# Patient Record
Sex: Female | Born: 1945 | Race: Black or African American | Hispanic: No | Marital: Married | Smoking: Never smoker
Health system: Southern US, Community
[De-identification: ages and names within clinical notes are randomized; demographics above are authoritative.]

## PROBLEM LIST (undated history)

## (undated) DIAGNOSIS — H8110 Benign paroxysmal vertigo, unspecified ear: Secondary | ICD-10-CM

## (undated) DIAGNOSIS — C189 Malignant neoplasm of colon, unspecified: Secondary | ICD-10-CM

## (undated) DIAGNOSIS — G4730 Sleep apnea, unspecified: Secondary | ICD-10-CM

## (undated) DIAGNOSIS — Z8541 Personal history of malignant neoplasm of cervix uteri: Secondary | ICD-10-CM

## (undated) DIAGNOSIS — C539 Malignant neoplasm of cervix uteri, unspecified: Secondary | ICD-10-CM

## (undated) DIAGNOSIS — Z952 Presence of prosthetic heart valve: Secondary | ICD-10-CM

## (undated) DIAGNOSIS — I509 Heart failure, unspecified: Secondary | ICD-10-CM

## (undated) DIAGNOSIS — D649 Anemia, unspecified: Secondary | ICD-10-CM

## (undated) DIAGNOSIS — R011 Cardiac murmur, unspecified: Secondary | ICD-10-CM

## (undated) DIAGNOSIS — N189 Chronic kidney disease, unspecified: Secondary | ICD-10-CM

## (undated) DIAGNOSIS — I639 Cerebral infarction, unspecified: Secondary | ICD-10-CM

## (undated) DIAGNOSIS — I10 Essential (primary) hypertension: Secondary | ICD-10-CM

## (undated) DIAGNOSIS — K219 Gastro-esophageal reflux disease without esophagitis: Secondary | ICD-10-CM

## (undated) HISTORY — DX: Essential (primary) hypertension: I10

## (undated) HISTORY — DX: Anemia, unspecified: D64.9

## (undated) HISTORY — PX: DIAGNOSTIC LAPAROSCOPY: SUR761

## (undated) HISTORY — DX: Malignant neoplasm of colon, unspecified: C18.9

## (undated) HISTORY — DX: Presence of prosthetic heart valve: Z95.2

## (undated) HISTORY — PX: CHOLECYSTECTOMY: SHX55

## (undated) HISTORY — DX: Cardiac murmur, unspecified: R01.1

## (undated) HISTORY — DX: Benign paroxysmal vertigo, unspecified ear: H81.10

## (undated) HISTORY — DX: Personal history of malignant neoplasm of cervix uteri: Z85.41

## (undated) HISTORY — PX: COLONOSCOPY WITH ESOPHAGOGASTRODUODENOSCOPY (EGD): SHX5779

## (undated) HISTORY — DX: Cerebral infarction, unspecified: I63.9

## (undated) HISTORY — PX: COLON SURGERY: SHX602

## (undated) HISTORY — DX: Gastro-esophageal reflux disease without esophagitis: K21.9

## (undated) HISTORY — PX: OTHER SURGICAL HISTORY: SHX169

## (undated) HISTORY — PX: APPENDECTOMY: SHX54

---

## 1996-10-11 DIAGNOSIS — C189 Malignant neoplasm of colon, unspecified: Secondary | ICD-10-CM

## 1996-10-11 HISTORY — PX: COLOSTOMY: SHX63

## 1996-10-11 HISTORY — DX: Malignant neoplasm of colon, unspecified: C18.9

## 2006-01-23 ENCOUNTER — Emergency Department: Payer: Self-pay | Admitting: Emergency Medicine

## 2006-10-11 HISTORY — PX: AORTIC VALVE REPLACEMENT: SHX41

## 2006-10-11 HISTORY — PX: CARDIAC CATHETERIZATION: SHX172

## 2007-04-01 ENCOUNTER — Inpatient Hospital Stay: Payer: Self-pay | Admitting: Internal Medicine

## 2007-04-01 ENCOUNTER — Other Ambulatory Visit: Payer: Self-pay

## 2007-04-20 ENCOUNTER — Ambulatory Visit: Payer: Self-pay | Admitting: Family Medicine

## 2007-04-24 ENCOUNTER — Ambulatory Visit: Payer: Self-pay | Admitting: Family Medicine

## 2007-05-03 ENCOUNTER — Ambulatory Visit: Payer: Self-pay | Admitting: Cardiology

## 2007-05-07 ENCOUNTER — Other Ambulatory Visit: Payer: Self-pay

## 2007-05-07 ENCOUNTER — Emergency Department: Payer: Self-pay | Admitting: Emergency Medicine

## 2007-05-23 ENCOUNTER — Ambulatory Visit: Payer: Self-pay | Admitting: Cardiology

## 2007-06-01 ENCOUNTER — Ambulatory Visit: Payer: Self-pay | Admitting: Surgery

## 2007-08-31 ENCOUNTER — Ambulatory Visit: Payer: Self-pay | Admitting: Cardiology

## 2007-08-31 ENCOUNTER — Inpatient Hospital Stay: Payer: Self-pay | Admitting: Cardiology

## 2007-09-14 ENCOUNTER — Other Ambulatory Visit: Payer: Self-pay

## 2007-09-14 ENCOUNTER — Inpatient Hospital Stay: Payer: Self-pay | Admitting: Internal Medicine

## 2008-01-05 ENCOUNTER — Ambulatory Visit: Payer: Self-pay | Admitting: Surgery

## 2008-08-06 ENCOUNTER — Ambulatory Visit: Payer: Self-pay | Admitting: Cardiology

## 2008-08-28 ENCOUNTER — Ambulatory Visit: Payer: Self-pay

## 2008-10-01 ENCOUNTER — Inpatient Hospital Stay: Payer: Self-pay | Admitting: Internal Medicine

## 2008-11-19 ENCOUNTER — Inpatient Hospital Stay: Payer: Self-pay | Admitting: Internal Medicine

## 2008-11-22 IMAGING — CR DG CHEST 2V
1 series · 2 of 2 positions shown · non-contrast
Comparison: none

REASON FOR EXAM: cough
COMMENTS:

[Series 1: view not recorded · 0.17mm/px · 2 of 2 slices shown]
[im 1/2]
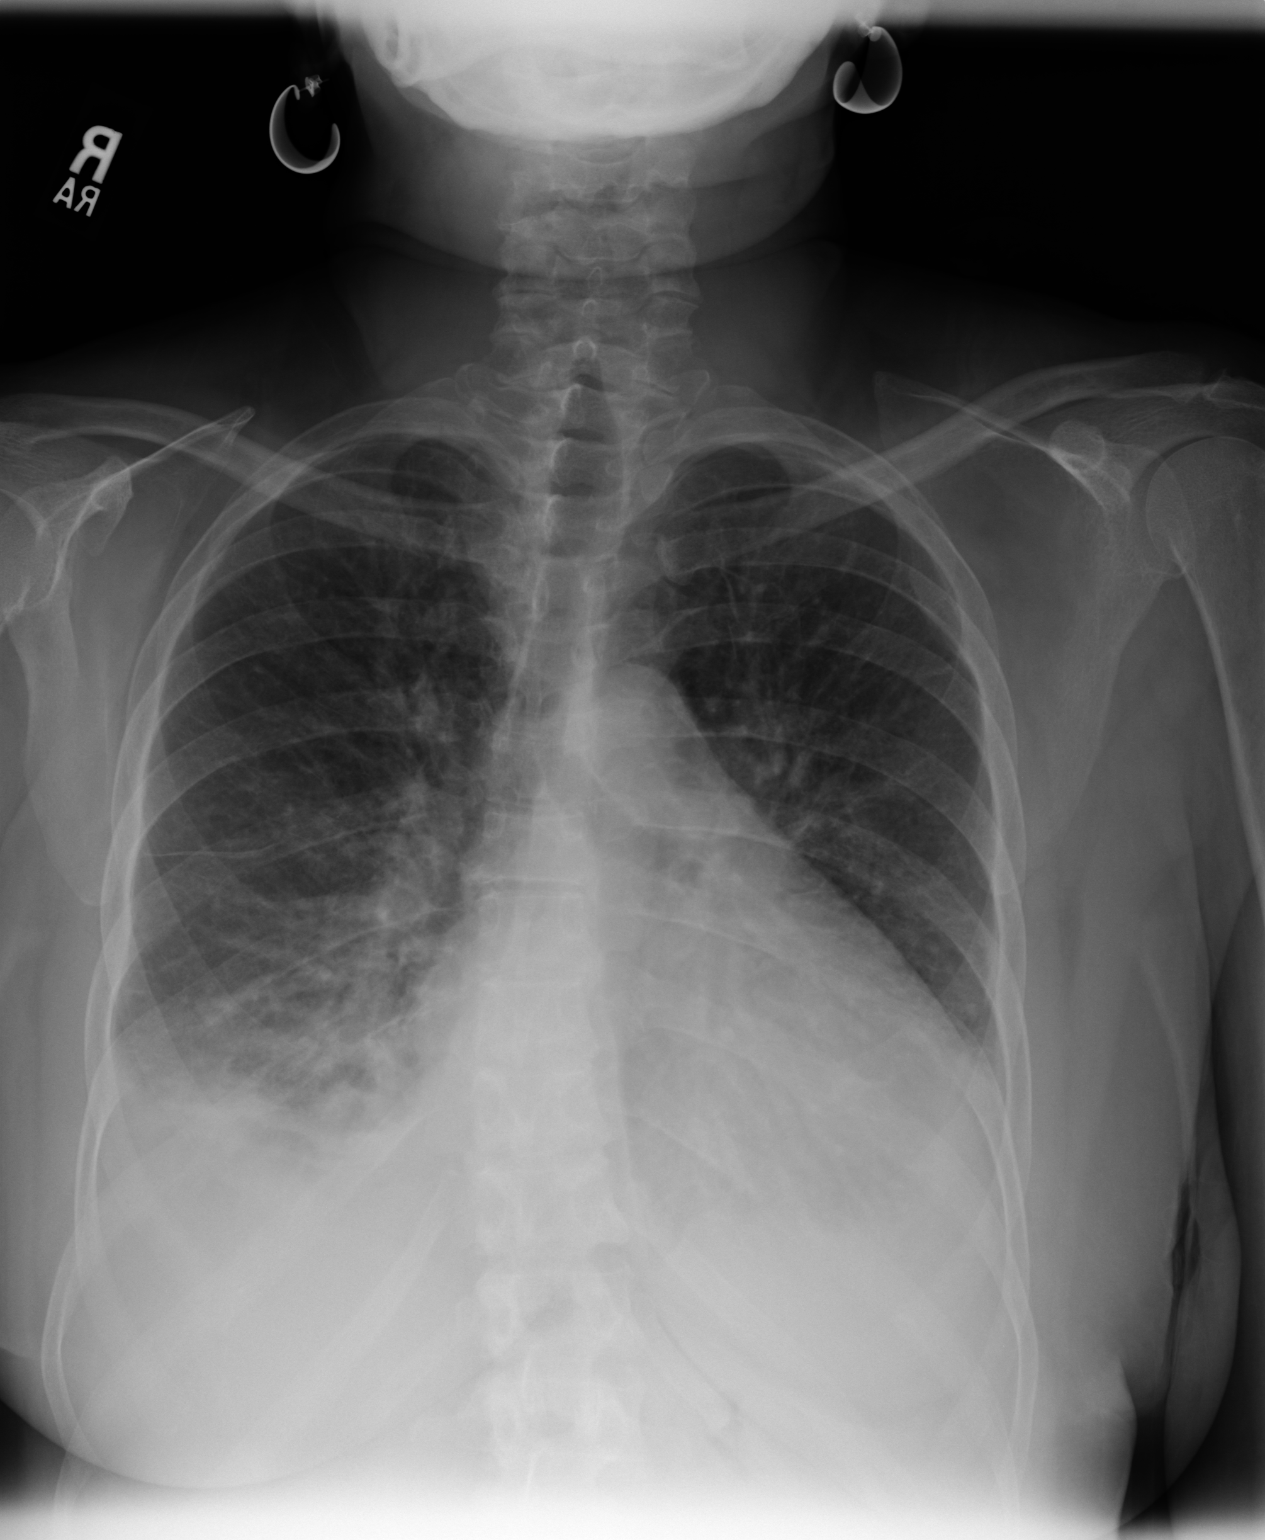
[im 2/2]
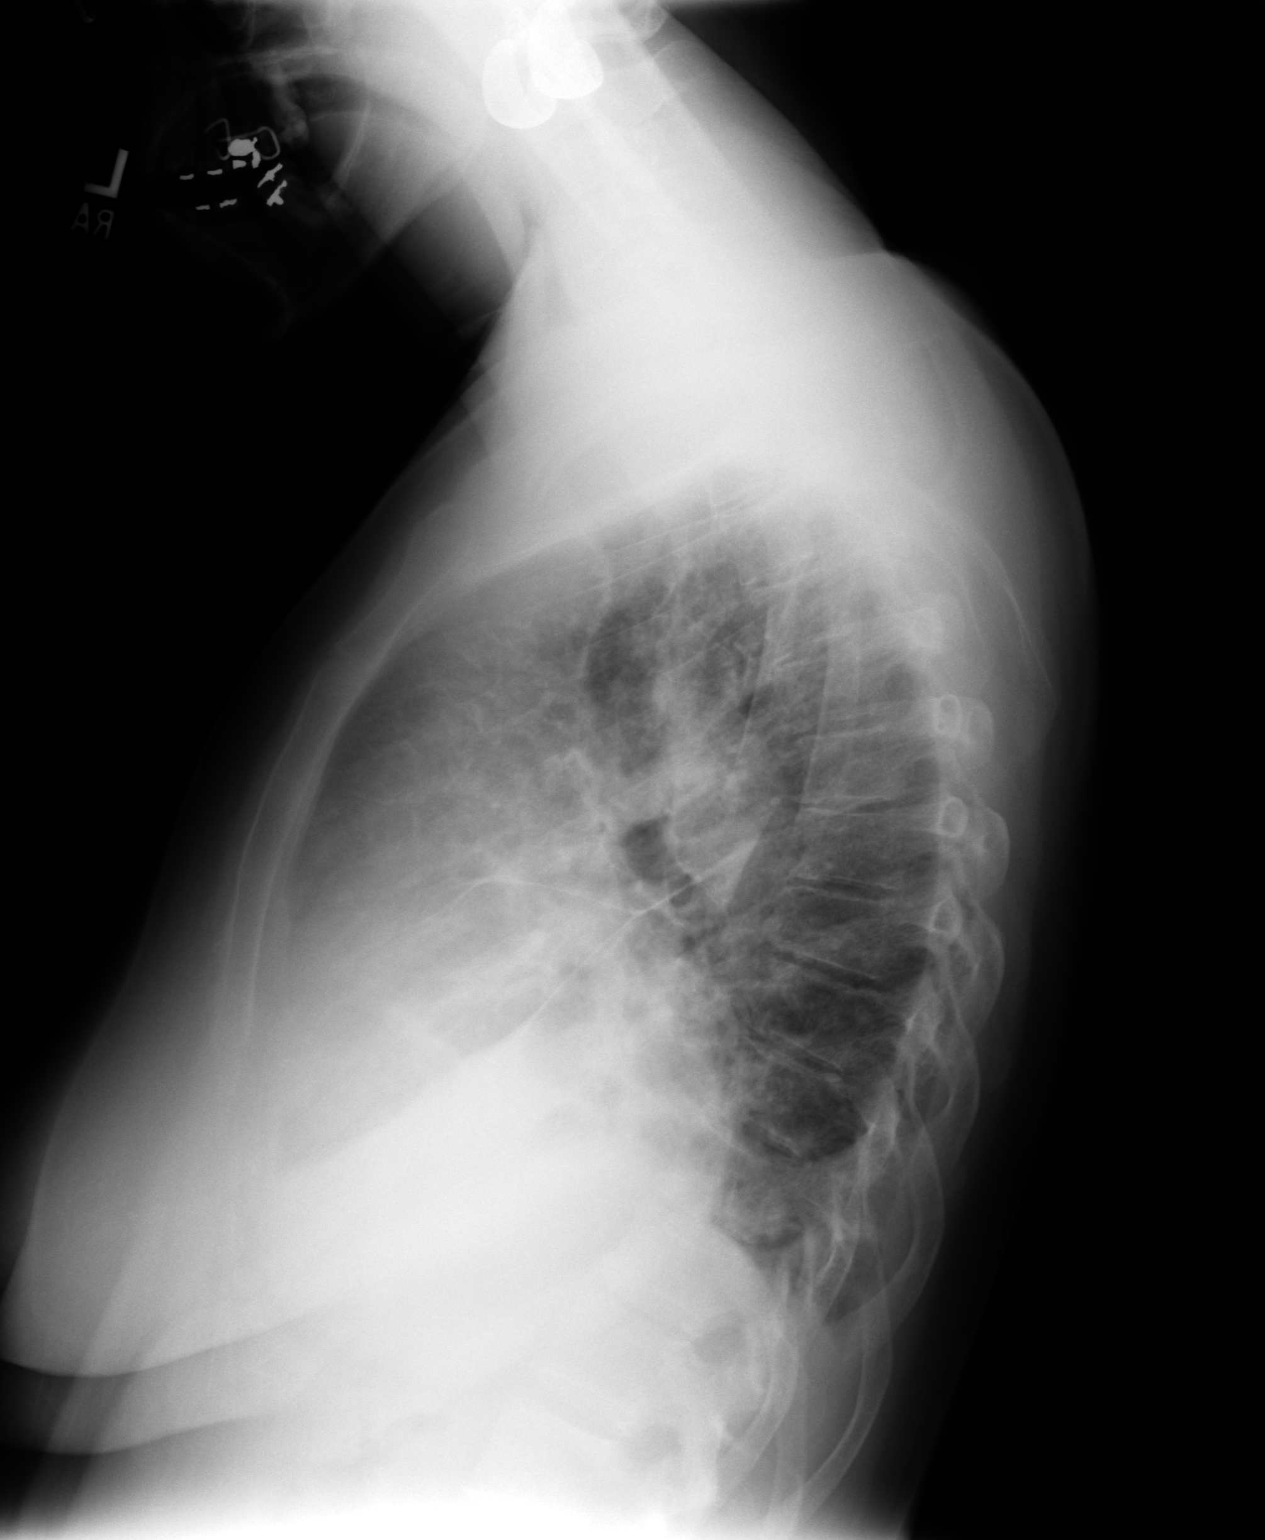

[2 of 2 positions shown; findings below may reference images not displayed]

PROCEDURE:     DXR - DXR CHEST PA (OR AP) AND LATERAL  - April 01, 2007  [DATE]

RESULT:     There is thickening demonstrating some pulmonary vascular and
interstitial markings as well as peribronchial cuffing.  Areas of increased
density project within the RIGHT and LEFT lung bases.  There appears to be
small bilateral pulmonary effusions.  The cardiac silhouette is enlarged,
indicative of cardiomegaly.  The visualized bony skeleton does not
demonstrate evidence of fracture nor dislocation.
IMPRESSION: 1.     Findings consistent with pulmonary edema.
2.     Atelectasis versus infiltrate versus asymmetric edema within the lung
bases.  There also appear to be superimposed effusions.  Repeat surveillance
evaluation recommended.

## 2008-11-22 IMAGING — CT CT CHEST W/ CM
2 series · 15 of 31 positions shown, 19 images · IV contrast (APPLIED)
Comparison: none

REASON FOR EXAM: sob, elevated d dimer
COMMENTS:

[Series 5: soft tissue · axial · 0.68mm/px · z∈[-356,-308]mm · 2 of 101 slices shown]
[im 8/101  mediastinal]
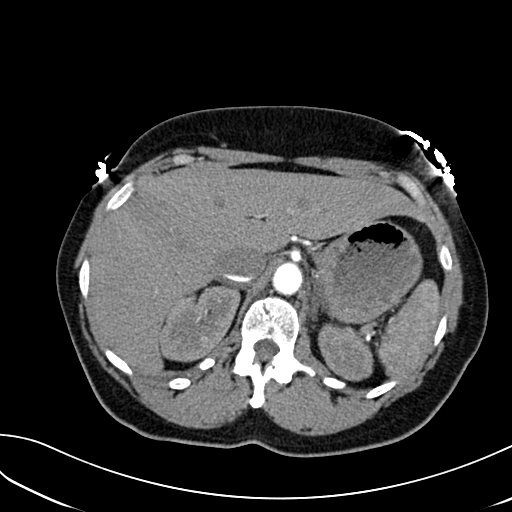
[im 24/101  mediastinal]
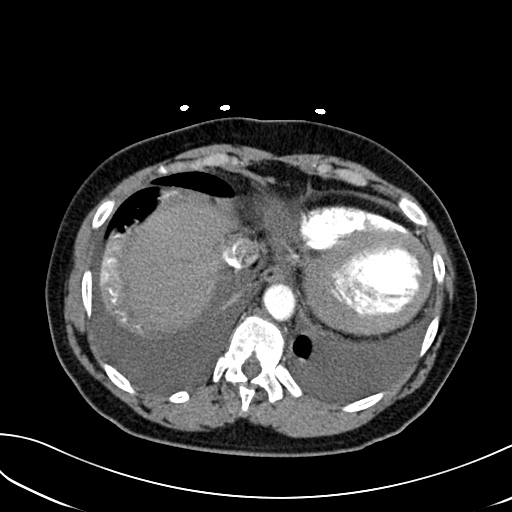

[Series 6: lung windows · axial · 0.68mm/px · z∈[-350,-101]mm · 13 of 99 slices shown, 17 images]
[im 8/99  mediastinal]
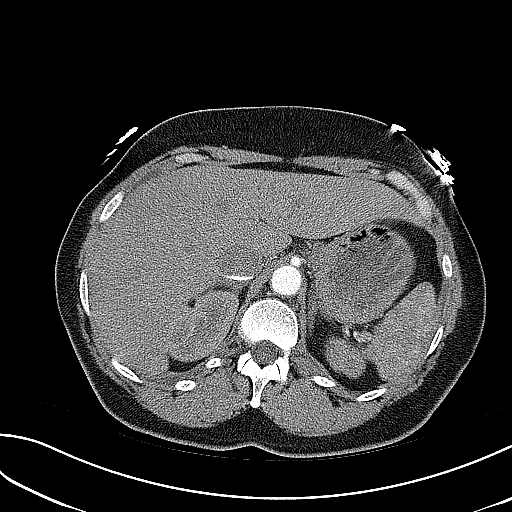
[im 8/99  lung]
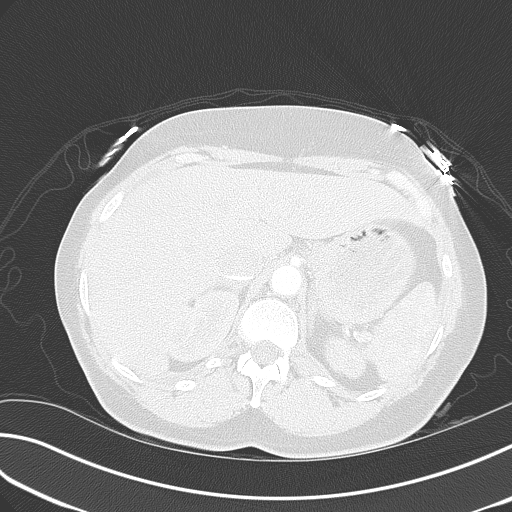
[im 16/99  lung]
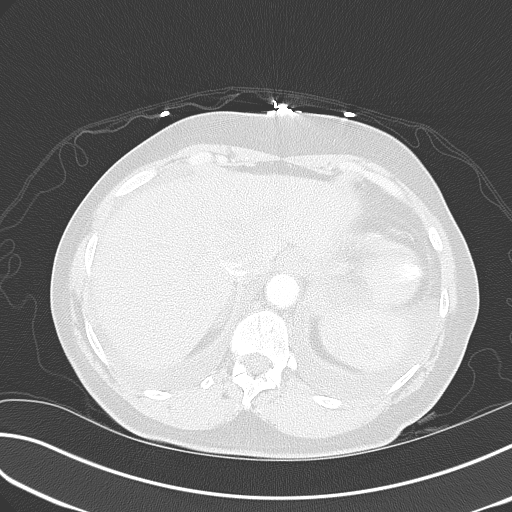
[im 23/99  lung]
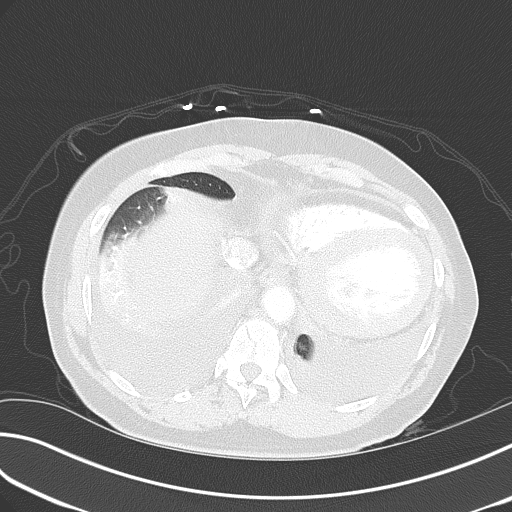
[im 31/99  lung]
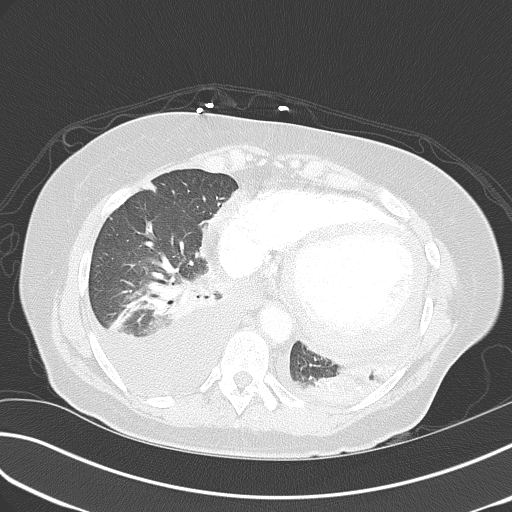
[im 38/99  mediastinal]
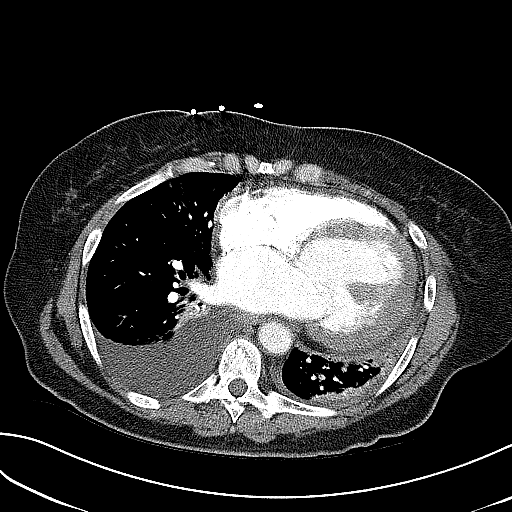
[im 38/99  lung]
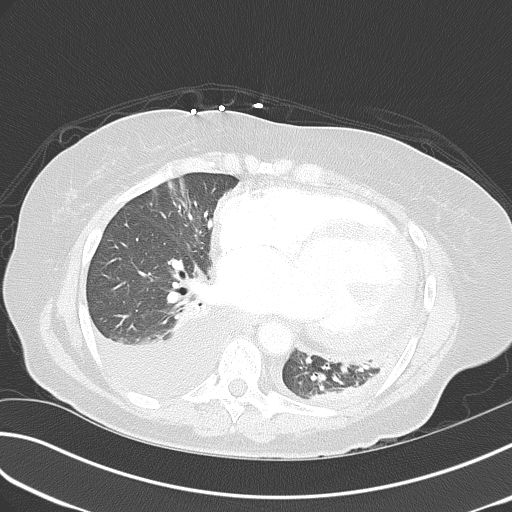
[im 46/99  lung]
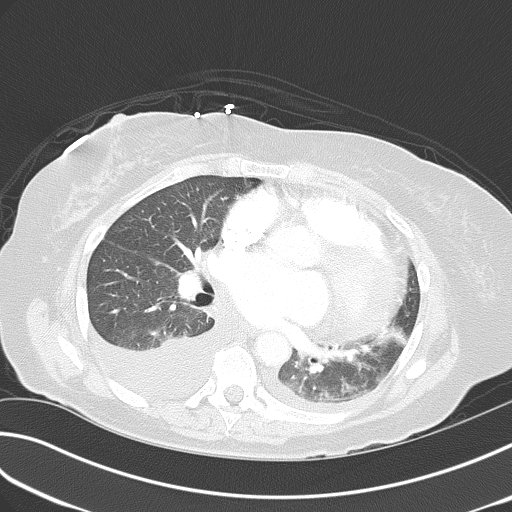
[im 50/99  lung]
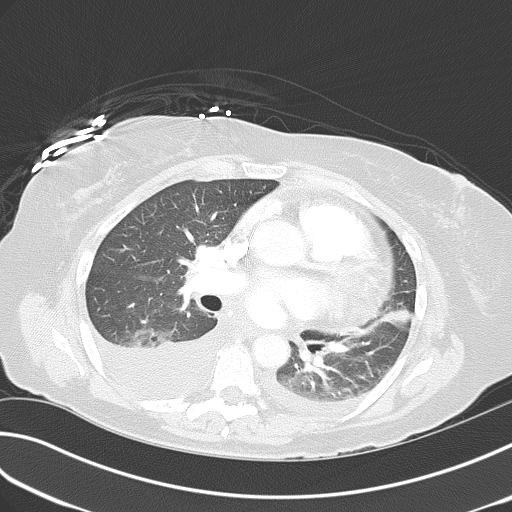
[im 53/99  lung]
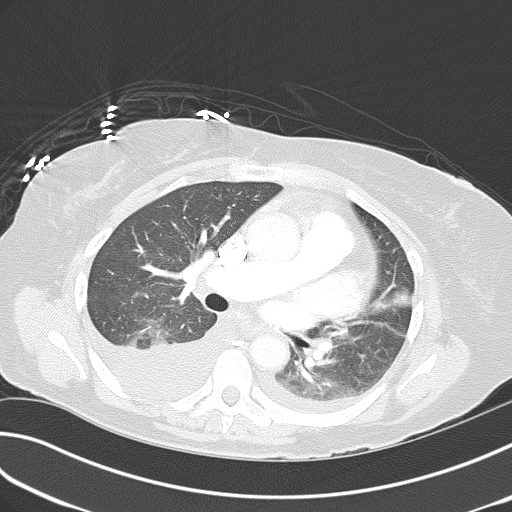
[im 61/99  mediastinal]
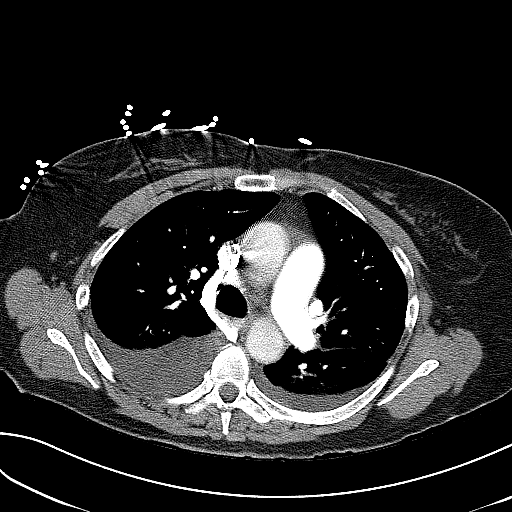
[im 61/99  lung]
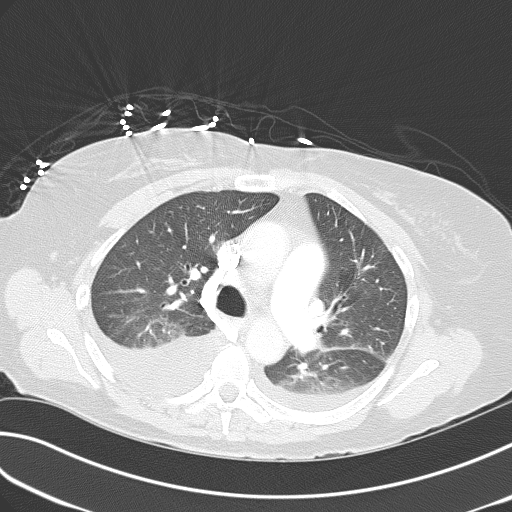
[im 68/99  lung]
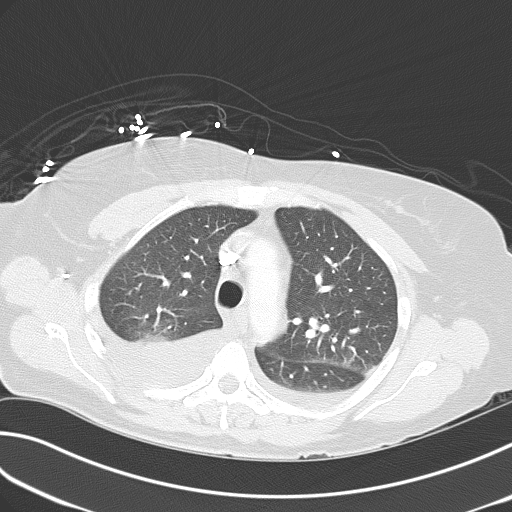
[im 76/99  lung]
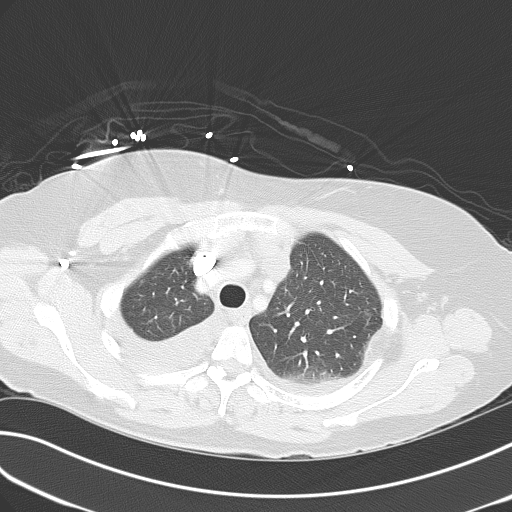
[im 83/99  lung]
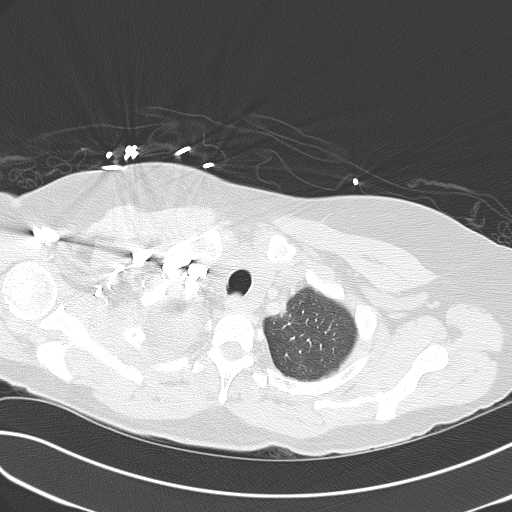
[im 91/99  mediastinal]
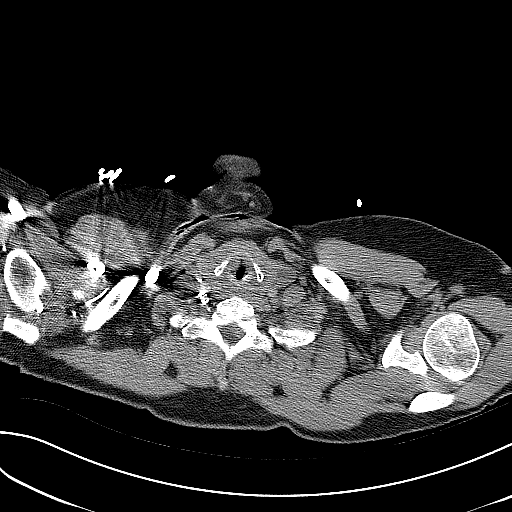
[im 91/99  lung]
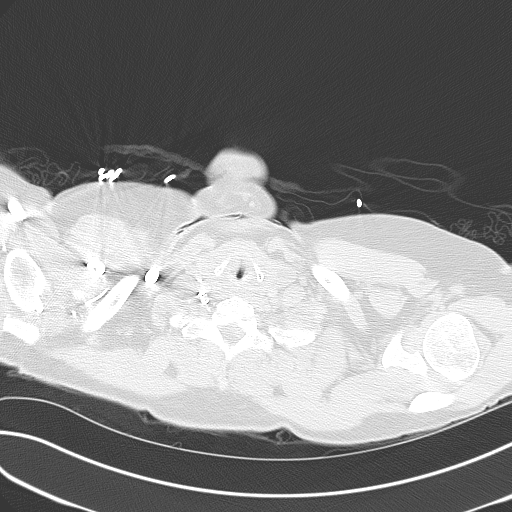

[15 of 31 positions shown; findings below may reference images not displayed]

PROCEDURE:     CT  - CT CHEST (FOR PE) W  - April 01, 2007 [DATE]

RESULT:
Axial 3 mm sections are obtained from the thoracic inlet to the lung bases
status post intravenous administration of 100 ml of Isovue 370.

Evaluation of mediastinal regions and structures demonstrates no evidence of
mediastinal nor hilar adenopathy or masses.  Multichamber cardiac
enlargement is appreciated.  There does not appear to be evidence of filling
defects within the main lobar or segmental pulmonary arteries.  Bilateral
pulmonary effusions are identified, small on the LEFT, moderate to small on
the RIGHT.  Evaluation of the lung parenchyma demonstrate diffuse air space
disease within the lower lobes with areas of consolidation and atelectasis
within the lung bases.  The visualized upper abdominal viscera demonstrate
no gross abnormalities.
IMPRESSION: 1.     No evidence of pulmonary embolic disease.
2.     Bilateral effusions.
3.     Atelectasis versus infiltrate lung bases as well as possibly an
element of pulmonary edema and/or infectious infiltrate if clinically
warranted.

## 2008-11-28 ENCOUNTER — Emergency Department: Payer: Self-pay | Admitting: Emergency Medicine

## 2008-12-15 IMAGING — US ULTRASOUND RIGHT BREAST
1 series · 17 of 23 positions shown · non-contrast
Comparison: none

REASON FOR EXAM: Right breast density
COMMENTS:

[Series 1: ultrasound right breast · 17 of 23 slices shown]
[im 1/23]
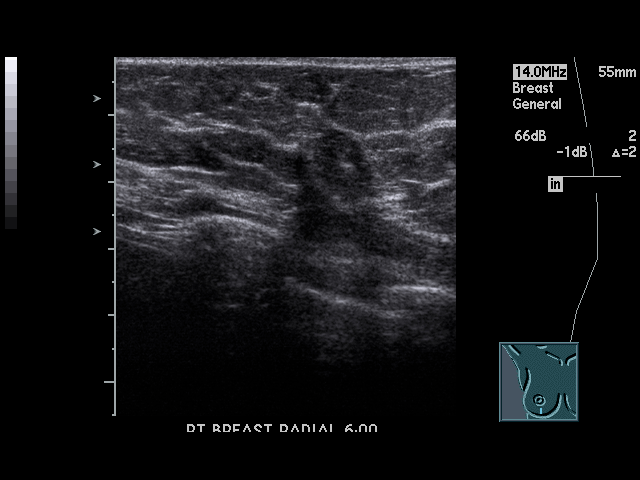
[im 3/23]
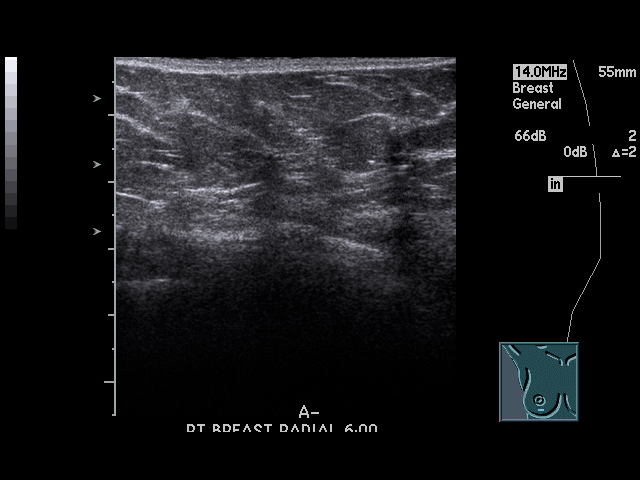
[im 4/23]
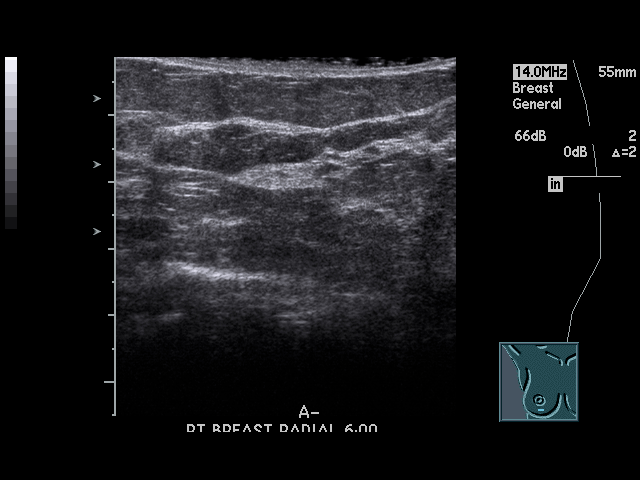
[im 5/23]
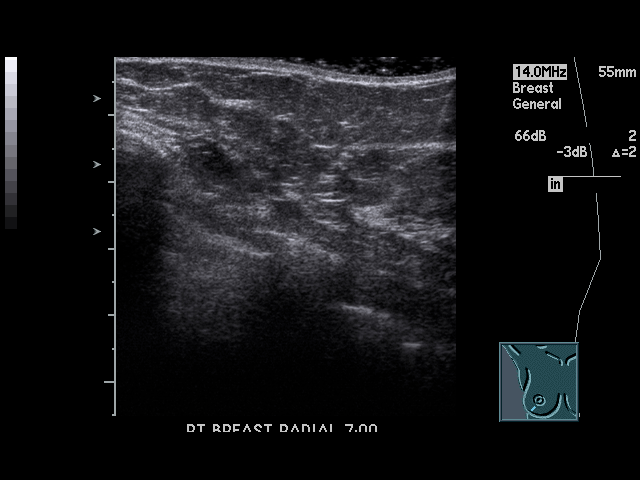
[im 7/23]
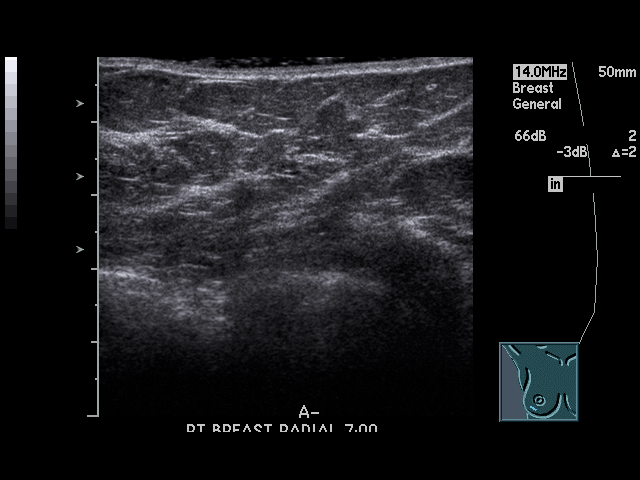
[im 8/23]
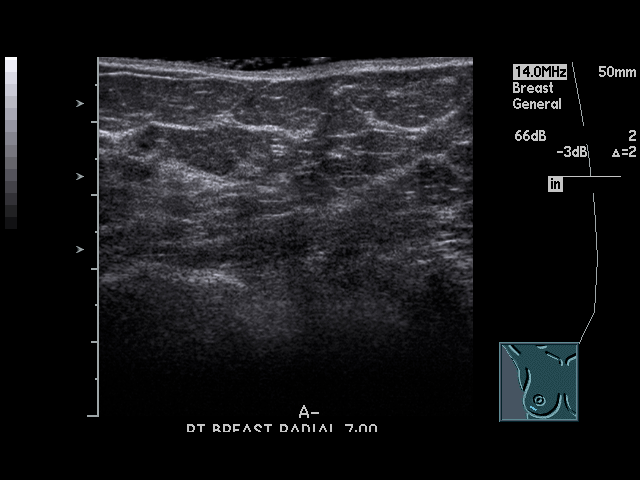
[im 9/23]
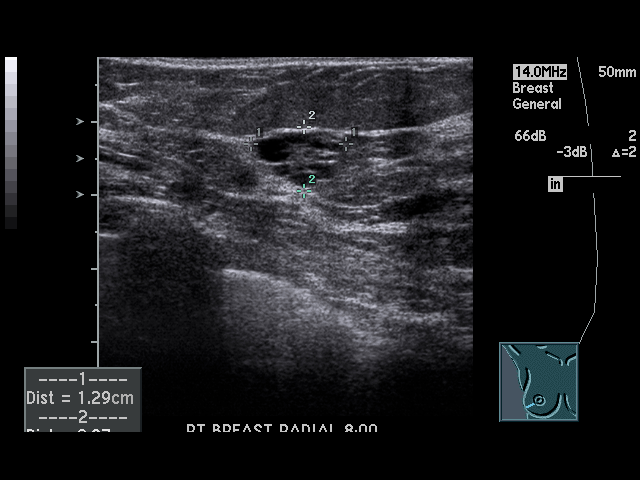
[im 11/23]
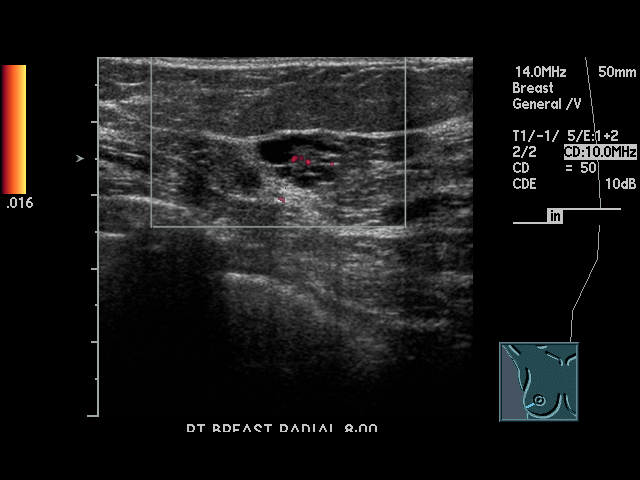
[im 12/23]
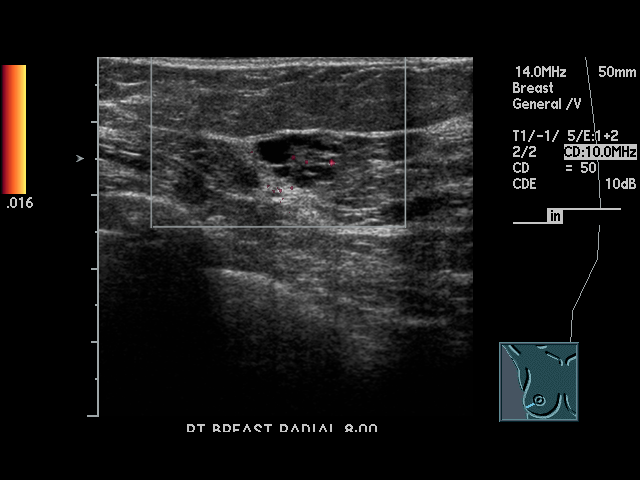
[im 13/23]
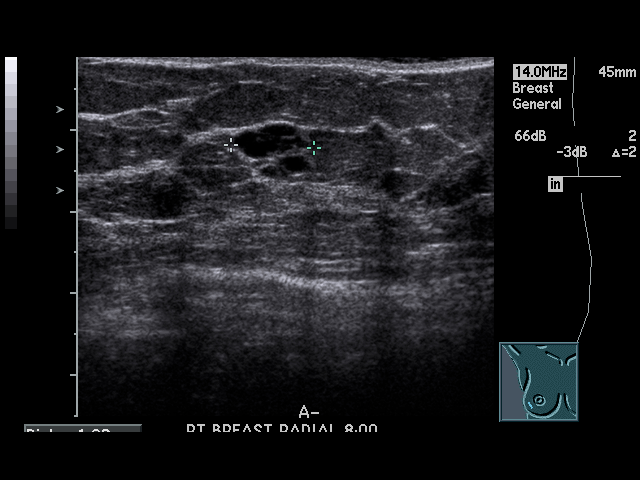
[im 15/23]
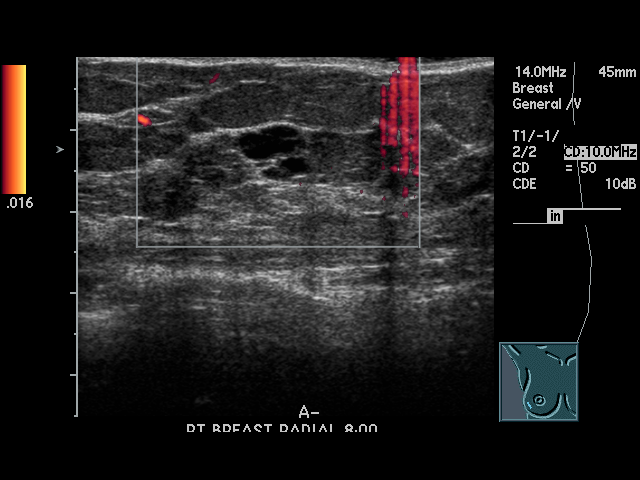
[im 16/23]
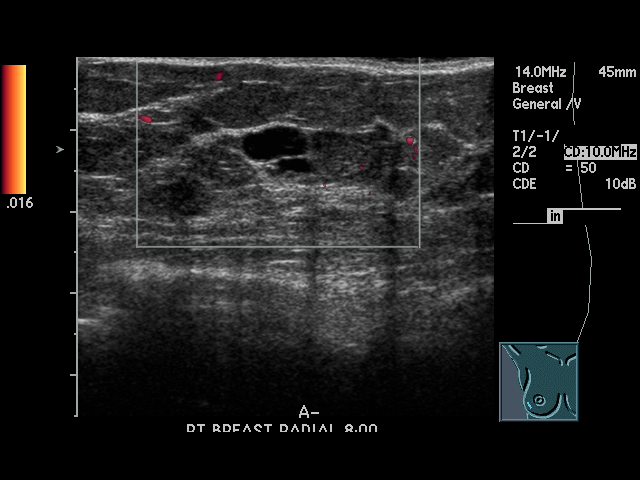
[im 17/23]
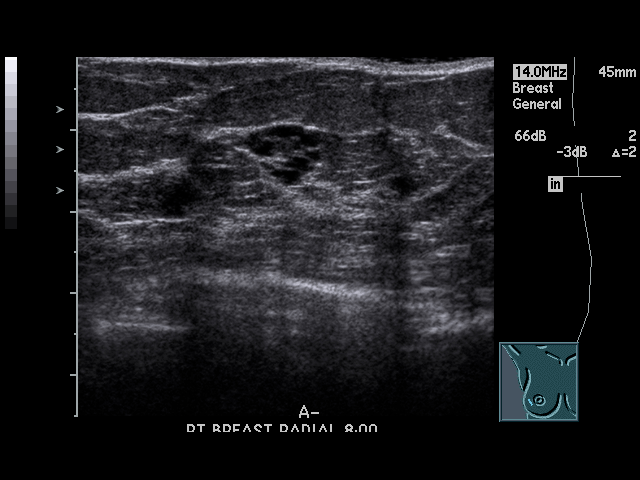
[im 19/23]
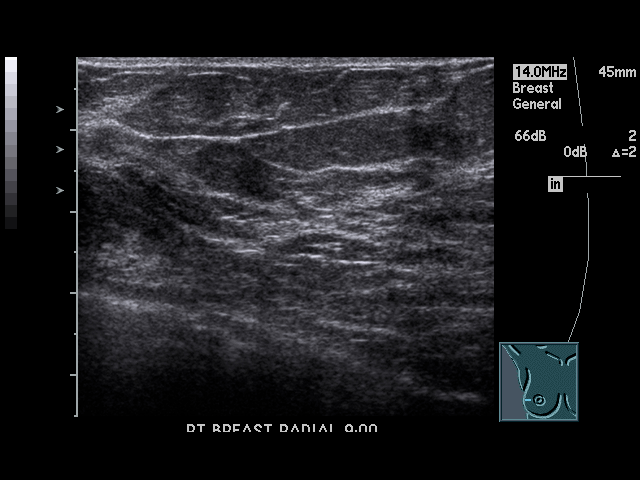
[im 20/23]
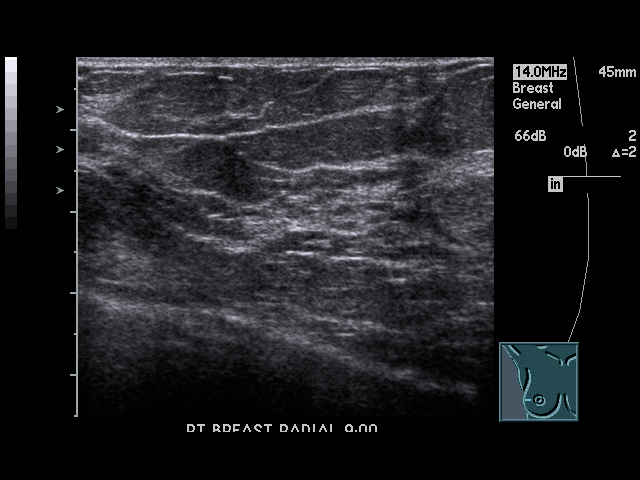
[im 21/23]
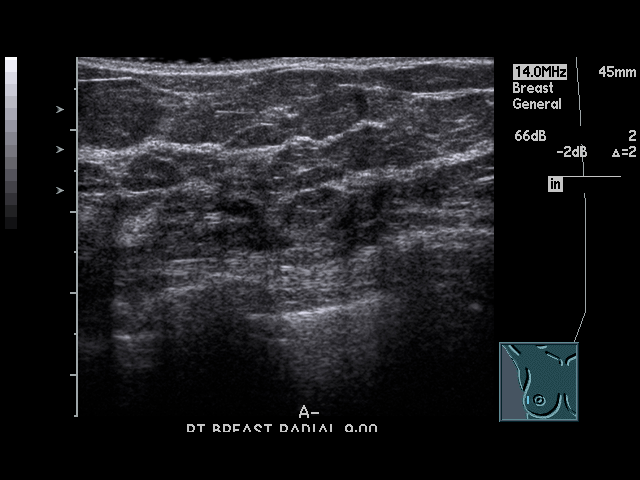
[im 23/23]
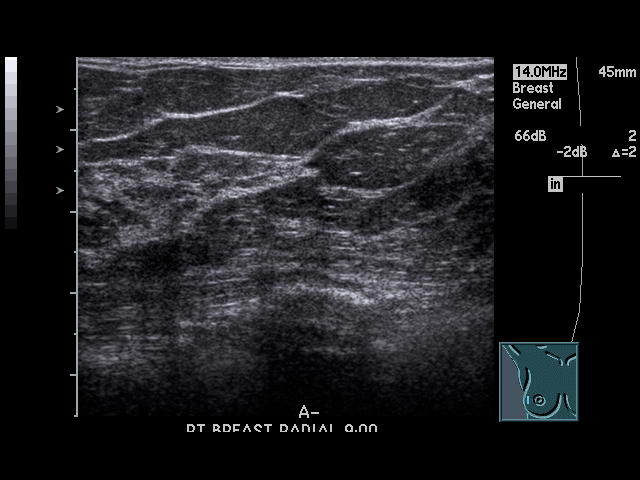

[17 of 23 positions shown; findings below may reference images not displayed]

PROCEDURE:     US  - US BREAST RIGHT  - April 24, 2007  [DATE]

RESULT:     There are no prior studies available for comparison.

The area at 8 o'clock in the RIGHT breast demonstrates a complex cystic
structure with flow in the intervening parenchyma or septations. This area
measures approximately 12.9 mm x 8.7 mm x 10.2 mm. The area is nonspecific.
This is an indeterminate lesion. At minimum, a six month follow-up is
recommended but surgical consultation is recommended for further
investigation.
IMPRESSION: BI-RADS: Category 4 - Suspicious Abnormality

Thank you for this opportunity to contribute to the care of your patient.

A NEGATIVE MAMMOGRAM REPORT DOES NOT PRECLUDE BIOPSY OR OTHER EVALUATION OF
A CLINICALLY PALPABLE OR OTHERWISE SUSPICIOUS MASS OR LESION. BREAST CANCER
MAY NOT BE DETECTED BY MAMMOGRAPHY IN UP TO 10% OF CASES.

## 2008-12-25 ENCOUNTER — Inpatient Hospital Stay: Payer: Self-pay | Admitting: Specialist

## 2009-10-14 ENCOUNTER — Emergency Department: Payer: Self-pay | Admitting: Emergency Medicine

## 2009-11-10 ENCOUNTER — Ambulatory Visit: Payer: Self-pay | Admitting: Internal Medicine

## 2009-12-31 ENCOUNTER — Inpatient Hospital Stay: Payer: Self-pay | Admitting: Internal Medicine

## 2010-01-21 ENCOUNTER — Ambulatory Visit: Payer: Self-pay | Admitting: Pain Medicine

## 2010-03-24 ENCOUNTER — Ambulatory Visit: Payer: Self-pay | Admitting: Gastroenterology

## 2011-04-16 ENCOUNTER — Emergency Department: Payer: Self-pay | Admitting: *Deleted

## 2011-04-19 ENCOUNTER — Inpatient Hospital Stay: Payer: Self-pay | Admitting: Internal Medicine

## 2011-08-13 ENCOUNTER — Ambulatory Visit: Payer: Self-pay | Admitting: Adult Health

## 2011-08-13 ENCOUNTER — Inpatient Hospital Stay: Payer: Self-pay | Admitting: Internal Medicine

## 2011-08-16 LAB — CEA: CEA: 2.1 ng/mL (ref 0.0–4.7)

## 2011-08-23 LAB — PATHOLOGY REPORT

## 2011-09-22 ENCOUNTER — Ambulatory Visit: Payer: Self-pay | Admitting: Gynecologic Oncology

## 2011-09-22 ENCOUNTER — Ambulatory Visit: Payer: Self-pay

## 2011-11-02 ENCOUNTER — Ambulatory Visit: Payer: Self-pay | Admitting: Gynecologic Oncology

## 2011-11-12 ENCOUNTER — Ambulatory Visit: Payer: Self-pay | Admitting: Gynecologic Oncology

## 2013-05-23 ENCOUNTER — Ambulatory Visit: Payer: Self-pay | Admitting: Family Medicine

## 2013-10-23 ENCOUNTER — Ambulatory Visit: Payer: Self-pay | Admitting: Family Medicine

## 2014-02-13 ENCOUNTER — Ambulatory Visit: Payer: Self-pay | Admitting: Cardiovascular Disease

## 2014-02-26 ENCOUNTER — Ambulatory Visit: Payer: Self-pay | Admitting: Gastroenterology

## 2014-02-27 LAB — PATHOLOGY REPORT

## 2014-03-06 ENCOUNTER — Encounter: Payer: Self-pay | Admitting: Cardiovascular Disease

## 2014-03-06 ENCOUNTER — Ambulatory Visit (INDEPENDENT_AMBULATORY_CARE_PROVIDER_SITE_OTHER): Payer: Medicare PPO | Admitting: Cardiovascular Disease

## 2014-03-06 ENCOUNTER — Encounter (INDEPENDENT_AMBULATORY_CARE_PROVIDER_SITE_OTHER): Payer: Self-pay

## 2014-03-06 VITALS — BP 160/98 | HR 66 | Ht 62.0 in | Wt 163.5 lb

## 2014-03-06 DIAGNOSIS — R011 Cardiac murmur, unspecified: Secondary | ICD-10-CM | POA: Insufficient documentation

## 2014-03-06 DIAGNOSIS — Z9889 Other specified postprocedural states: Secondary | ICD-10-CM

## 2014-03-06 DIAGNOSIS — I10 Essential (primary) hypertension: Secondary | ICD-10-CM

## 2014-03-06 DIAGNOSIS — Z954 Presence of other heart-valve replacement: Secondary | ICD-10-CM

## 2014-03-06 DIAGNOSIS — R0602 Shortness of breath: Secondary | ICD-10-CM

## 2014-03-06 DIAGNOSIS — R079 Chest pain, unspecified: Secondary | ICD-10-CM

## 2014-03-06 MED ORDER — CLONIDINE HCL 0.1 MG PO TABS: 0.1000 mg | ORAL_TABLET | Freq: Two times a day (BID) | ORAL | Status: DC

## 2014-03-06 NOTE — Assessment & Plan Note (Signed)
Significant murmur appreciated on exam. Now with shortness of breath, episodes of chest pain. History of mitral valve annuloplasty ring. Repeat echocardiogram has been ordered to evaluate her valve

## 2014-03-06 NOTE — Assessment & Plan Note (Signed)
Prominent murmur likely secondary to mitral valve regurgitation, status post repair 2008

## 2014-03-06 NOTE — Patient Instructions (Signed)
Will will order an echocardiogram to evaluate your mitral valve repair and causes of your shortness of breath  Please start clonidine one in the am and pm Please call with blood pressure numbers  Please call us if you have new issues that need to be addressed before your next appt.  Your physician wants you to follow-up in: 1 month.

## 2014-03-06 NOTE — Assessment & Plan Note (Signed)
We have recommended that she start clonidine 0.1 mg twice a day. She was previously on clonidine 0.2 mg at the dose was decreased secondary to hypotension

## 2014-03-06 NOTE — Assessment & Plan Note (Signed)
Etiology of her shortness of breath is unclear. Possible contribution from her mitral valve regurgitation. Weight is elevated, deconditioning also likely contributing

## 2014-03-06 NOTE — Progress Notes (Signed)
Patient ID: Kendra Ritter, female    DOB: [birth year], 68 y.o.   MRN: [id number]  HPI Comments: Kendra Ritter is a 68 year old woman with history of mitral valve repair in 2008 at [facility] who presents by referral for evaluation of her murmur, shortness of breath, mitral valve, chest pain. Prior notes detail ejection fraction 35-40%, previous severe MR  She reports that she has been having episodes of chest pain from time to time. Sometimes associated with exertion, sometimes at rest. More commonly when she is not exerting herself. She reports that her blood pressure has been running high, typically 627-035 systolic. When asked about details of her surgery, she is unfamiliar as to what they did. Records obtained from [facility] through our system details mitral valve repair She has significant mitral valve regurgitation. Echocardiogram in Feb 2010 showed ejection fraction 35-40%, moderate MR that time interestingly echo around the same time done in the hospital showed only mild MR  She had a 26 mm ring placed by Dr. Evelina Ritter at [facility]. No significant CAD by catheterization July 2008 she does have a history of DVT November 2008  Notes indicate possible sleep apnea She was previously on clonidine twice a day for blood pressure control Cardiac catheterization in 2008 showed ejection fraction 45%, moderate MR   recent lab work June 2014 showing total cholesterol 149, LDL 37  EKG today shows normal sinus rhythm with rate 71 beats per minute, nonspecific ST and T wave abnormality in the anterolateral leads     Outpatient Encounter Prescriptions as of 03/06/2014  Medication Sig  . carvedilol (COREG) 25 MG tablet Take 25 mg by mouth 2 (two) times daily with a meal.  . esomeprazole (NEXIUM) 40 MG capsule Take 40 mg by mouth daily at 12 noon.  . gabapentin (NEURONTIN) 300 MG capsule Take 300 mg by mouth 3 (three) times daily.   . hydrochlorothiazide (HYDRODIURIL) 25 MG tablet Take 25 mg by mouth  daily.  Marland Kitchen losartan (COZAAR) 100 MG tablet Take 100 mg by mouth daily.  . meclizine (ANTIVERT) 25 MG tablet Take 25 mg by mouth daily.    Review of Systems  Constitutional: Negative.   HENT: Negative.   Eyes: Negative.   Respiratory: Positive for shortness of breath.   Cardiovascular: Positive for chest pain.  Gastrointestinal: Negative.   Endocrine: Negative.   Musculoskeletal: Negative.   Skin: Negative.   Allergic/Immunologic: Negative.   Neurological: Negative.   Hematological: Negative.   Psychiatric/Behavioral: Negative.   All other systems reviewed and are negative.   BP 160/98  Pulse 66  Ht 5\' 2"  (1.575 m)  Wt 163 lb 8 oz (74.163 kg)  BMI 29.90 kg/m2  Physical Exam  Nursing note and vitals reviewed. Constitutional: She is oriented to person, place, and time. She appears well-developed and well-nourished.  HENT:  Head: Normocephalic.  Nose: Nose normal.  Mouth/Throat: Oropharynx is clear and moist.  Eyes: Conjunctivae are normal. Pupils are equal, round, and reactive to light.  Neck: Normal range of motion. Neck supple. No JVD present.  Cardiovascular: Normal rate, regular rhythm, S1 normal, S2 normal and intact distal pulses.  Exam reveals no gallop and no friction rub.   Murmur heard.  Systolic murmur is present with a grade of 2/6  Pulmonary/Chest: Effort normal and breath sounds normal. No respiratory distress. She has no wheezes. She has no rales. She exhibits no tenderness.  Abdominal: Soft. Bowel sounds are normal. She exhibits no distension. There is no tenderness.  Musculoskeletal:  Normal range of motion. She exhibits no edema and no tenderness.  Lymphadenopathy:    She has no cervical adenopathy.  Neurological: She is alert and oriented to person, place, and time. Coordination normal.  Skin: Skin is warm and dry. No rash noted. No erythema.  Psychiatric: She has a normal mood and affect. Her behavior is normal. Judgment and thought content normal.     Assessment and Plan

## 2014-03-06 NOTE — Assessment & Plan Note (Signed)
Atypical chest pain. Not typically with exertion. Prior cardiac catheterization showing no significant CAD in 2008 no further testing at this time. Echocardiogram pending

## 2014-03-18 ENCOUNTER — Other Ambulatory Visit: Payer: Self-pay

## 2014-03-18 ENCOUNTER — Other Ambulatory Visit (INDEPENDENT_AMBULATORY_CARE_PROVIDER_SITE_OTHER): Payer: Medicare PPO

## 2014-03-18 DIAGNOSIS — R0602 Shortness of breath: Secondary | ICD-10-CM

## 2014-03-18 DIAGNOSIS — I059 Rheumatic mitral valve disease, unspecified: Secondary | ICD-10-CM

## 2014-03-18 DIAGNOSIS — R011 Cardiac murmur, unspecified: Secondary | ICD-10-CM

## 2014-03-18 DIAGNOSIS — R079 Chest pain, unspecified: Secondary | ICD-10-CM

## 2014-03-18 DIAGNOSIS — Z9889 Other specified postprocedural states: Secondary | ICD-10-CM

## 2014-04-01 ENCOUNTER — Ambulatory Visit: Payer: Self-pay | Admitting: Urgent Care

## 2014-04-10 ENCOUNTER — Encounter: Payer: Self-pay | Admitting: Cardiovascular Disease

## 2014-04-10 ENCOUNTER — Ambulatory Visit (INDEPENDENT_AMBULATORY_CARE_PROVIDER_SITE_OTHER): Payer: Medicare PPO | Admitting: Cardiovascular Disease

## 2014-04-10 VITALS — BP 152/100 | Ht 62.0 in | Wt 166.0 lb

## 2014-04-10 DIAGNOSIS — R0602 Shortness of breath: Secondary | ICD-10-CM

## 2014-04-10 DIAGNOSIS — I10 Essential (primary) hypertension: Secondary | ICD-10-CM

## 2014-04-10 DIAGNOSIS — R079 Chest pain, unspecified: Secondary | ICD-10-CM

## 2014-04-10 DIAGNOSIS — Z9889 Other specified postprocedural states: Secondary | ICD-10-CM

## 2014-04-10 MED ORDER — AMLODIPINE BESYLATE 10 MG PO TABS: 10.0000 mg | ORAL_TABLET | Freq: Every day | ORAL | Status: DC

## 2014-04-10 NOTE — Progress Notes (Signed)
Patient ID: Kendra Ritter, female    DOB: 1945/12/24, 68 y.o.   MRN: [id number]  HPI Comments: Kendra Ritter is a 68 year old woman with history of mitral valve repair in 2008 at [facility] (She had a 26 mm ring placed by Dr. Evelina Dun at [facility] who initially presented  for evaluation of her murmur, shortness of breath, mitral valve, chest pain. Prior notes detail ejection fraction 35-40%, previous severe MR No significant coronary disease by catheterization in 2008  Recent echocardiogram showed well functioning mitral valve annuloplasty. No significant mitral valve regurgitation, normal ejection fraction estimated 55-60%.  States that she was unable to tolerate clonidine and stopped this sometime ago. Because the dry mouth. She has been taking her other medications for blood pressure has been running high. Otherwise she feels well with no complaints. No significant chest pain on today's visit.  Records obtained from [facility] through our system details mitral valve repair Echocardiogram in Feb 2010 showed ejection fraction 35-40%, moderate MR that time interestingly echo around the same time done in the hospital showed only mild MR No significant CAD by catheterization July 2008 she does have a history of DVT November 2008  Notes indicate possible sleep apnea Cardiac catheterization in 2008 showed ejection fraction 45%, moderate MR lab work June 2014 showing total cholesterol 149, LDL 37  EKG today shows normal sinus rhythm with rate 73 beats per minute, nonspecific ST and T wave abnormality in the anterolateral leads     Outpatient Encounter Prescriptions as of 04/10/2014  Medication Sig  . carvedilol (COREG) 25 MG tablet Take 25 mg by mouth 2 (two) times daily with a meal.  . cloNIDine (CATAPRES) 0.1 MG tablet Take 1 tablet (0.1 mg total) by mouth 2 (two) times daily. Not taking   . esomeprazole (NEXIUM) 40 MG capsule Take 40 mg by mouth daily at 12 noon.  . gabapentin (NEURONTIN) 300 MG  capsule Take 300 mg by mouth 3 (three) times daily.   . hydrochlorothiazide (HYDRODIURIL) 25 MG tablet Take 25 mg by mouth daily.  Marland Kitchen losartan (COZAAR) 100 MG tablet Take 100 mg by mouth daily.  . meclizine (ANTIVERT) 25 MG tablet Take 25 mg by mouth daily.    Review of Systems  Constitutional: Negative.   HENT: Negative.   Eyes: Negative.   Respiratory: Negative.   Cardiovascular: Negative.   Gastrointestinal: Negative.   Endocrine: Negative.   Musculoskeletal: Negative.   Skin: Negative.   Allergic/Immunologic: Negative.   Neurological: Negative.   Hematological: Negative.   Psychiatric/Behavioral: Negative.   All other systems reviewed and are negative.   BP 152/100  Ht 5\' 2"  (1.575 m)  Wt 166 lb (75.297 kg)  BMI 30.35 kg/m2  Physical Exam  Nursing note and vitals reviewed. Constitutional: She is oriented to person, place, and time. She appears well-developed and well-nourished.  HENT:  Head: Normocephalic.  Nose: Nose normal.  Mouth/Throat: Oropharynx is clear and moist.  Eyes: Conjunctivae are normal. Pupils are equal, round, and reactive to light.  Neck: Normal range of motion. Neck supple. No JVD present.  Cardiovascular: Normal rate, regular rhythm, S1 normal, S2 normal and intact distal pulses.  Exam reveals no gallop and no friction rub.   Murmur heard.  Systolic murmur is present with a grade of 2/6  Pulmonary/Chest: Effort normal and breath sounds normal. No respiratory distress. She has no wheezes. She has no rales. She exhibits no tenderness.  Abdominal: Soft. Bowel sounds are normal. She exhibits no distension. There is  no tenderness.  Musculoskeletal: Normal range of motion. She exhibits no edema and no tenderness.  Lymphadenopathy:    She has no cervical adenopathy.  Neurological: She is alert and oriented to person, place, and time. Coordination normal.  Skin: Skin is warm and dry. No rash noted. No erythema.  Psychiatric: She has a normal mood and  affect. Her behavior is normal. Judgment and thought content normal.    Assessment and Plan

## 2014-04-10 NOTE — Assessment & Plan Note (Signed)
She denies any further episodes of chest pain on today's visit. No further workup at this time

## 2014-04-10 NOTE — Assessment & Plan Note (Signed)
Blood pressure continues to run high. She did not tolerate clonidine. She has minimal leg edema and we will add amlodipine 5 mg daily for 2 weeks for titrating up to 10 mg daily.

## 2014-04-10 NOTE — Patient Instructions (Addendum)
You are doing well. Hold the clonidine  Start amlodipine 1/2 pill for 1 to 2 weeks Then increase to a full pill  Please call us if you have new issues that need to be addressed before your next appt.  Your physician wants you to follow-up in: 3 months.  You will receive a reminder letter in the mail two months in advance. If you don't receive a letter, please call our office to schedule the follow-up appointment.

## 2014-04-10 NOTE — Assessment & Plan Note (Signed)
Recent echocardiogram showing mitral valve intact with no significant regurgitation, normal ejection fraction 

## 2014-09-03 ENCOUNTER — Ambulatory Visit: Payer: Medicare PPO | Admitting: Cardiovascular Disease

## 2014-09-11 ENCOUNTER — Ambulatory Visit: Payer: Self-pay | Admitting: Physician Assistant

## 2014-09-19 ENCOUNTER — Ambulatory Visit: Payer: Medicare PPO | Admitting: Cardiovascular Disease

## 2014-10-17 ENCOUNTER — Encounter: Payer: Self-pay | Admitting: Cardiovascular Disease

## 2014-10-17 ENCOUNTER — Ambulatory Visit (INDEPENDENT_AMBULATORY_CARE_PROVIDER_SITE_OTHER): Payer: Medicare PPO | Admitting: Cardiovascular Disease

## 2014-10-17 VITALS — BP 138/78 | HR 65 | Ht 61.5 in | Wt 169.5 lb

## 2014-10-17 DIAGNOSIS — R011 Cardiac murmur, unspecified: Secondary | ICD-10-CM

## 2014-10-17 DIAGNOSIS — Z9889 Other specified postprocedural states: Secondary | ICD-10-CM

## 2014-10-17 DIAGNOSIS — I10 Essential (primary) hypertension: Secondary | ICD-10-CM

## 2014-10-17 DIAGNOSIS — R079 Chest pain, unspecified: Secondary | ICD-10-CM

## 2014-10-17 NOTE — Progress Notes (Signed)
Patient ID: Kendra Ritter, female    DOB: [birth year], 69 y.o.   MRN: [id number]  HPI Comments: Kendra Ritter is a 69 year old woman with history of mitral valve repair in 2008 at [facility] (She had a 26 mm ring placed by Dr. Evelina Dun at [facility].) who initially presented  for evaluation of her murmur, shortness of breath, mitral valve, chest pain. Prior notes detail ejection fraction 35-40%, previous severe MR No significant coronary disease by catheterization in 2008 echocardiogram showed well functioning mitral valve annuloplasty. No significant mitral valve regurgitation, normal ejection fraction estimated 55-60%. Possible sleep apnea She follows up for management of her blood pressure  She reports that she is doing well on amlodipine 10 mg daily, losartan, HCTZ and carvedilol. Denies having any side effects. Is relatively active. Not doing a regular exercise program, having difficulty losing weight. Denies having any worsening of her leg edema, just very mild around the feet  Review of her blood work with her shows glucose 136, total cholesterol 159, HDL 60, LDL 59 Clonidine previously helpful dry mouth  EKG on today's visit shows normal sinus rhythm with rate 65 bpm, nonspecific T wave abnormality in the anterolateral leads, seen previously  Records obtained from [facility] through our system details mitral valve repair Echocardiogram in Feb 2010 showed ejection fraction 35-40%, moderate MR that time interestingly echo around the same time done in the hospital showed only mild MR No significant CAD by catheterization July 2008 she does have a history of DVT November 2008    Allergies  Allergen Reactions  . Lisinopril     Other reaction(s): Other (See Comments) Other Reaction: bad cough    Outpatient Encounter Prescriptions as of 10/17/2014  Medication Sig  . amLODipine (NORVASC) 10 MG tablet Take 1 tablet (10 mg total) by mouth daily.  . carvedilol (COREG) 25 MG tablet Take 25 mg  by mouth 2 (two) times daily with a meal.  . cloNIDine (CATAPRES) 0.1 MG tablet Take 1 tablet (0.1 mg total) by mouth 2 (two) times daily as needed.  Marland Kitchen esomeprazole (NEXIUM) 40 MG capsule Take 40 mg by mouth daily at 12 noon.  . gabapentin (NEURONTIN) 300 MG capsule Take 300 mg by mouth 3 (three) times daily.   . hydrochlorothiazide (HYDRODIURIL) 25 MG tablet Take 25 mg by mouth daily.  Marland Kitchen losartan (COZAAR) 100 MG tablet Take 100 mg by mouth daily.  . meclizine (ANTIVERT) 25 MG tablet Take 25 mg by mouth daily.    Past Medical History  Diagnosis Date  . Hypertension   . GERD (gastroesophageal reflux disease)   . H/O aortic valve replacement   . Anemia   . BPPV (benign paroxysmal positional vertigo)   . Colon cancer 1998  . Stroke     multiple strokes, 2010, 2011, 2013   . Hx of cervical cancer   . Heart murmur     Past Surgical History  Procedure Laterality Date  . Partial hysterectomy      cervical cancer  . Appendectomy    . Aortic valve replacement  2008    [facility]  . Colostomy  1998    After colon cancer   . Cardiac catheterization  2008    [facility]    Social History  reports that she has never smoked. She does not have any smokeless tobacco history on file. She reports that she does not drink alcohol or use illicit drugs.  Family History family history includes Heart attack in her mother; Heart  disease in her mother; Hypertension in her mother.   Review of Systems  Constitutional: Negative.   Respiratory: Negative.   Cardiovascular: Negative.   Gastrointestinal: Negative.   Musculoskeletal: Negative.   Neurological: Negative.   Hematological: Negative.   Psychiatric/Behavioral: Negative.   All other systems reviewed and are negative.   BP 138/78 mmHg  Pulse 65  Ht 5' 1.5" (1.562 m)  Wt 169 lb 8 oz (76.885 kg)  BMI 31.51 kg/m2  Physical Exam  Constitutional: She is oriented to person, place, and time. She appears well-developed and well-nourished.  HENT:   Head: Normocephalic.  Nose: Nose normal.  Mouth/Throat: Oropharynx is clear and moist.  Eyes: Conjunctivae are normal. Pupils are equal, round, and reactive to light.  Neck: Normal range of motion. Neck supple. No JVD present.  Cardiovascular: Normal rate, regular rhythm, S1 normal, S2 normal, normal heart sounds and intact distal pulses.  Exam reveals no gallop and no friction rub.   No murmur heard. Pulmonary/Chest: Effort normal and breath sounds normal. No respiratory distress. She has no wheezes. She has no rales. She exhibits no tenderness.  Abdominal: Soft. Bowel sounds are normal. She exhibits no distension. There is no tenderness.  Musculoskeletal: Normal range of motion. She exhibits no edema or tenderness.  Lymphadenopathy:    She has no cervical adenopathy.  Neurological: She is alert and oriented to person, place, and time. Coordination normal.  Skin: Skin is warm and dry. No rash noted. No erythema.  Psychiatric: She has a normal mood and affect. Her behavior is normal. Judgment and thought content normal.    Assessment and Plan  Nursing note and vitals reviewed.

## 2014-10-17 NOTE — Assessment & Plan Note (Signed)
Blood pressure is well controlled on today's visit. No changes made to the medications. 

## 2014-10-17 NOTE — Patient Instructions (Signed)
You are doing well. No medication changes were made.  Please call us if you have new issues that need to be addressed before your next appt.  Your physician wants you to follow-up in: 12 months.  You will receive a reminder letter in the mail two months in advance. If you don't receive a letter, please call our office to schedule the follow-up appointment. 

## 2014-10-17 NOTE — Assessment & Plan Note (Signed)
Recent echocardiogram showing mitral valve intact with no significant regurgitation, normal ejection fraction

## 2014-10-17 NOTE — Assessment & Plan Note (Signed)
No further symptoms of chest pain. No further workup at this time

## 2015-01-14 ENCOUNTER — Emergency Department
Admit: 2015-01-14 | Disposition: A | Discharge status: Home / Self Care | Payer: Self-pay | Admitting: Emergency Medicine

## 2015-01-14 LAB — BASIC METABOLIC PANEL
Anion Gap: 12 (ref 7–16)
BUN: 10 mg/dL
CO2: 29 mmol/L
Calcium, Total: 8.8 mg/dL — ABNORMAL LOW
Chloride: 98 mmol/L — ABNORMAL LOW
Creatinine: 1.02 mg/dL — ABNORMAL HIGH
EGFR (African American): >60
EGFR (Non-African Amer.): 57 — ABNORMAL LOW
GLUCOSE: 149 mg/dL — AB
Potassium: 2.4 mmol/L — CL
SODIUM: 139 mmol/L

## 2015-01-14 LAB — CBC
HCT: 33.4 % — ABNORMAL LOW (ref 35.0–47.0)
HGB: 10.7 g/dL — ABNORMAL LOW (ref 12.0–16.0)
MCH: 26.3 pg (ref 26.0–34.0)
MCHC: 31.9 g/dL — AB (ref 32.0–36.0)
MCV: 83 fL (ref 80–100)
PLATELETS: 486 10*3/uL — AB (ref 150–440)
RBC: 4.05 10*6/uL (ref 3.80–5.20)
RDW: 15.4 % — AB (ref 11.5–14.5)
WBC: 13 10*3/uL — ABNORMAL HIGH (ref 3.6–11.0)

## 2015-01-14 LAB — TROPONIN I: TROPONIN-I: 0.03 ng/mL

## 2015-09-22 DIAGNOSIS — Z8673 Personal history of transient ischemic attack (TIA), and cerebral infarction without residual deficits: Secondary | ICD-10-CM | POA: Insufficient documentation

## 2015-09-22 DIAGNOSIS — G4730 Sleep apnea, unspecified: Secondary | ICD-10-CM | POA: Insufficient documentation

## 2015-09-22 DIAGNOSIS — K219 Gastro-esophageal reflux disease without esophagitis: Secondary | ICD-10-CM | POA: Insufficient documentation

## 2015-11-13 ENCOUNTER — Ambulatory Visit: Payer: Medicare PPO | Admitting: Cardiovascular Disease

## 2015-11-27 ENCOUNTER — Ambulatory Visit: Payer: Medicare PPO | Admitting: Cardiovascular Disease

## 2015-12-17 ENCOUNTER — Other Ambulatory Visit: Payer: Self-pay | Admitting: Family Medicine

## 2015-12-17 DIAGNOSIS — Z1231 Encounter for screening mammogram for malignant neoplasm of breast: Secondary | ICD-10-CM

## 2015-12-29 ENCOUNTER — Ambulatory Visit: Payer: Medicare PPO

## 2016-01-02 ENCOUNTER — Other Ambulatory Visit: Payer: Self-pay | Admitting: Family Medicine

## 2016-01-02 ENCOUNTER — Ambulatory Visit
Admission: RE | Admit: 2016-01-02 | Discharge: 2016-01-02 | Disposition: A | Discharge status: Home / Self Care | Payer: Medicare HMO | Source: Ambulatory Visit | Attending: Family Medicine | Admitting: Family Medicine

## 2016-01-02 DIAGNOSIS — Z1231 Encounter for screening mammogram for malignant neoplasm of breast: Secondary | ICD-10-CM

## 2016-01-13 ENCOUNTER — Ambulatory Visit: Payer: Medicare PPO | Admitting: Cardiovascular Disease

## 2016-03-12 ENCOUNTER — Ambulatory Visit: Payer: Medicare PPO | Admitting: Cardiovascular Disease

## 2016-04-02 ENCOUNTER — Ambulatory Visit: Payer: Medicare PPO | Admitting: Cardiovascular Disease

## 2016-04-26 ENCOUNTER — Encounter: Payer: Self-pay | Admitting: Cardiovascular Disease

## 2016-04-26 ENCOUNTER — Ambulatory Visit (INDEPENDENT_AMBULATORY_CARE_PROVIDER_SITE_OTHER): Payer: Medicare HMO | Admitting: Cardiovascular Disease

## 2016-04-26 VITALS — BP 160/80 | HR 79 | Ht 62.0 in | Wt 167.0 lb

## 2016-04-26 DIAGNOSIS — I159 Secondary hypertension, unspecified: Secondary | ICD-10-CM | POA: Diagnosis not present

## 2016-04-26 DIAGNOSIS — R011 Cardiac murmur, unspecified: Secondary | ICD-10-CM | POA: Diagnosis not present

## 2016-04-26 DIAGNOSIS — Z9889 Other specified postprocedural states: Secondary | ICD-10-CM

## 2016-04-26 NOTE — Patient Instructions (Signed)
Medication Instructions:  Your physician recommends that you continue on your current medications as directed. Please refer to the Current Medication list given to you today.   Labwork: none  Testing/Procedures: Your physician has requested that you have a renal artery duplex. During this test, an ultrasound is used to evaluate blood flow to the kidneys. Allow one hour for this exam. Do not eat after midnight the day before and avoid carbonated beverages. Take your medications as you usually do.    Follow-Up: Your physician wants you to follow-up in: one year with Dr. Rockey Situ.  You will receive a reminder letter in the mail two months in advance. If you don't receive a letter, please call our office to schedule the follow-up appointment.   Any Other Special Instructions Will Be Listed Below (If Applicable).     If you need a refill on your cardiac medications before your next appointment, please call your pharmacy.

## 2016-04-26 NOTE — Progress Notes (Signed)
Cardiology Office Note   Date:  04/26/2016   ID:  Kendra Ritter, DOB May 26, 1946, MRN [id number]  PCP:  Letta Median, MD  Cardiologist:   Dr. Rockey Situ.   Chief Complaint  Patient presents with  . Heart Murmur    yearly check up      History of Present Illness: Kendra Ritter is a 70 y.o. female who presents for an annual follow-up visit. She has known history of mitral valve repair in 2008 at [facility], essential hypertension and sleep apnea not on CPAP. She had previous cardiomyopathy and mitral regurgitation. Most recent echocardiogram in June 2015 showed normal LV systolic function, intact mitral valve repair with no significant stenosis. She has been doing well overall with no chest pain, worsening dyspnea, orthopnea or PND. She has chronic leg edema. She has difficult to control hypertension. She reports sleep apnea but does not use her CPAP machine because she is scared of it. She takes her medications regularly.   Past Medical History  Diagnosis Date  . Hypertension   . GERD (gastroesophageal reflux disease)   . H/O aortic valve replacement   . Anemia   . BPPV (benign paroxysmal positional vertigo)   . Colon cancer ([place]) 1998  . Stroke [facility])     multiple strokes, 2010, 2011, 2013   . Hx of cervical cancer   . Heart murmur     Past Surgical History  Procedure Laterality Date  . Partial hysterectomy      cervical cancer  . Appendectomy    . Aortic valve replacement  2008    [facility]  . Colostomy  1998    After colon cancer   . Cardiac catheterization  2008    [facility]     Current Outpatient Prescriptions  Medication Sig Dispense Refill  . amLODipine (NORVASC) 10 MG tablet Take 1 tablet (10 mg total) by mouth daily. 30 tablet 6  . carvedilol (COREG) 25 MG tablet Take 25 mg by mouth 2 (two) times daily with a meal.    . cloNIDine (CATAPRES) 0.1 MG tablet Take 1 tablet (0.1 mg total) by mouth 2 (two) times daily as needed. 60 tablet 11  . esomeprazole (NEXIUM) 40  MG capsule Take 40 mg by mouth daily at 12 noon.    . gabapentin (NEURONTIN) 300 MG capsule Take 300 mg by mouth 3 (three) times daily.     . hydrochlorothiazide (HYDRODIURIL) 25 MG tablet Take 25 mg by mouth daily.    Marland Kitchen losartan (COZAAR) 100 MG tablet Take 100 mg by mouth daily.    . meclizine (ANTIVERT) 25 MG tablet Take 25 mg by mouth daily.     No current facility-administered medications for this visit.    Allergies:   Lisinopril    Social History:  The patient  reports that she has never smoked. She does not have any smokeless tobacco history on file. She reports that she does not drink alcohol or use illicit drugs.   Family History:  The patient's family history includes Breast cancer in her maternal aunt; Heart attack in her mother; Heart disease in her mother; Hypertension in her mother.    ROS:  Please see the history of present illness.   Otherwise, review of systems are positive for none.   All other systems are reviewed and negative.    PHYSICAL EXAM: VS:  BP 160/80 mmHg  Pulse 79  Ht 5\' 2"  (1.575 m)  Wt 167 lb (75.751 kg)  BMI 30.54  kg/m2  SpO2 97% , BMI Body mass index is 30.54 kg/(m^2). GEN: Well nourished, well developed, in no acute distress HEENT: normal Neck: no JVD, carotid bruits, or masses Cardiac: RRR; no rubs, or gallops, mild edema . One out of 6 systolic ejection murmur in the aortic area. Respiratory:  clear to auscultation bilaterally, normal work of breathing GI: soft, nontender, nondistended, + BS MS: no deformity or atrophy Skin: warm and dry, no rash Neuro:  Strength and sensation are intact Psych: euthymic mood, full affect   EKG:  EKG is ordered today. The ekg ordered today demonstrates normal sinus rhythm with lateral T wave changes suggestive of ischemia.   Recent Labs: No results found for requested labs within last 365 days.    Lipid Panel No results found for: CHOL, TRIG, HDL, CHOLHDL, VLDL, LDLCALC, LDLDIRECT    Wt Readings  from Last 3 Encounters:  04/26/16 167 lb (75.751 kg)  10/17/14 169 lb 8 oz (76.885 kg)  04/10/14 166 lb (75.297 kg)         ASSESSMENT AND PLAN:  1.  Mitral valve disease status post mitral valve repair in 2008: Most recent echocardiogram in 2015 showed normal LV systolic function with intact mitral valve. Continue clinical monitoring. No indication for repeat echocardiogram at the present time.  2. Essential hypertension: Her blood pressure is refractory in spite of 5 different blood pressure medications. It is possible that untreated Sleep apnea is contributing but I also think we have to exclude renal artery stenosis. Thus, I requested a renal artery duplex. Continue same medications in the meanwhile.  3. Sleep apnea: I strongly advised her to discuss with her primary care physician about management options as this might be contributing to uncontrolled hypertension.    Disposition:   FU with Dr. Rockey Situ in 1 year  Signed,  Kathlyn Sacramento, MD  04/26/2016 3:17 PM    [place]

## 2016-05-28 ENCOUNTER — Ambulatory Visit: Payer: Medicare HMO

## 2016-05-28 DIAGNOSIS — I159 Secondary hypertension, unspecified: Secondary | ICD-10-CM

## 2016-05-31 ENCOUNTER — Other Ambulatory Visit: Payer: Self-pay | Admitting: Nurse Practitioner

## 2016-05-31 DIAGNOSIS — R197 Diarrhea, unspecified: Secondary | ICD-10-CM

## 2016-06-04 ENCOUNTER — Ambulatory Visit: Payer: Medicare HMO

## 2016-07-20 ENCOUNTER — Other Ambulatory Visit: Payer: Self-pay | Admitting: Family Medicine

## 2016-07-20 DIAGNOSIS — M810 Age-related osteoporosis without current pathological fracture: Secondary | ICD-10-CM

## 2016-08-18 ENCOUNTER — Ambulatory Visit
Admission: RE | Admit: 2016-08-18 | Discharge: 2016-08-18 | Disposition: A | Discharge status: Home / Self Care | Payer: Medicare Other | Source: Ambulatory Visit | Attending: Family Medicine | Admitting: Family Medicine

## 2016-08-18 DIAGNOSIS — G629 Polyneuropathy, unspecified: Secondary | ICD-10-CM | POA: Diagnosis not present

## 2016-08-18 DIAGNOSIS — Z9049 Acquired absence of other specified parts of digestive tract: Secondary | ICD-10-CM | POA: Diagnosis not present

## 2016-08-18 DIAGNOSIS — Z79899 Other long term (current) drug therapy: Secondary | ICD-10-CM

## 2016-08-18 DIAGNOSIS — Z8541 Personal history of malignant neoplasm of cervix uteri: Secondary | ICD-10-CM | POA: Diagnosis not present

## 2016-08-18 DIAGNOSIS — G459 Transient cerebral ischemic attack, unspecified: Principal | ICD-10-CM | POA: Diagnosis present

## 2016-08-18 DIAGNOSIS — Z933 Colostomy status: Secondary | ICD-10-CM | POA: Diagnosis not present

## 2016-08-18 DIAGNOSIS — Z888 Allergy status to other drugs, medicaments and biological substances status: Secondary | ICD-10-CM

## 2016-08-18 DIAGNOSIS — Z85038 Personal history of other malignant neoplasm of large intestine: Secondary | ICD-10-CM

## 2016-08-18 DIAGNOSIS — Z8249 Family history of ischemic heart disease and other diseases of the circulatory system: Secondary | ICD-10-CM

## 2016-08-18 DIAGNOSIS — Z952 Presence of prosthetic heart valve: Secondary | ICD-10-CM

## 2016-08-18 DIAGNOSIS — E876 Hypokalemia: Secondary | ICD-10-CM | POA: Diagnosis not present

## 2016-08-18 DIAGNOSIS — Z8673 Personal history of transient ischemic attack (TIA), and cerebral infarction without residual deficits: Secondary | ICD-10-CM

## 2016-08-18 DIAGNOSIS — I10 Essential (primary) hypertension: Secondary | ICD-10-CM | POA: Diagnosis present

## 2016-08-18 DIAGNOSIS — Z13820 Encounter for screening for osteoporosis: Secondary | ICD-10-CM

## 2016-08-18 DIAGNOSIS — R29701 NIHSS score 1: Secondary | ICD-10-CM | POA: Diagnosis present

## 2016-08-18 DIAGNOSIS — K529 Noninfective gastroenteritis and colitis, unspecified: Secondary | ICD-10-CM | POA: Diagnosis not present

## 2016-08-18 DIAGNOSIS — R531 Weakness: Secondary | ICD-10-CM | POA: Diagnosis present

## 2016-08-18 DIAGNOSIS — M6281 Muscle weakness (generalized): Secondary | ICD-10-CM | POA: Diagnosis present

## 2016-08-18 DIAGNOSIS — N390 Urinary tract infection, site not specified: Secondary | ICD-10-CM | POA: Diagnosis not present

## 2016-08-18 DIAGNOSIS — K219 Gastro-esophageal reflux disease without esophagitis: Secondary | ICD-10-CM | POA: Diagnosis not present

## 2016-08-18 DIAGNOSIS — M810 Age-related osteoporosis without current pathological fracture: Secondary | ICD-10-CM

## 2016-08-21 ENCOUNTER — Emergency Department: Payer: Medicare Other

## 2016-08-21 ENCOUNTER — Inpatient Hospital Stay: Payer: Medicare Other

## 2016-08-21 ENCOUNTER — Inpatient Hospital Stay
Admission: EM | Admit: 2016-08-21 | Discharge: 2016-08-23 | DRG: 069 | Disposition: A | Discharge status: Home / Self Care | Payer: Medicare Other | Attending: Internal Medicine | Admitting: Internal Medicine

## 2016-08-21 ENCOUNTER — Encounter: Payer: Self-pay | Admitting: Emergency Medicine

## 2016-08-21 DIAGNOSIS — G459 Transient cerebral ischemic attack, unspecified: Secondary | ICD-10-CM | POA: Diagnosis not present

## 2016-08-21 DIAGNOSIS — R531 Weakness: Secondary | ICD-10-CM | POA: Diagnosis not present

## 2016-08-21 DIAGNOSIS — M6281 Muscle weakness (generalized): Secondary | ICD-10-CM

## 2016-08-21 DIAGNOSIS — N390 Urinary tract infection, site not specified: Secondary | ICD-10-CM

## 2016-08-21 DIAGNOSIS — E876 Hypokalemia: Secondary | ICD-10-CM

## 2016-08-21 DIAGNOSIS — K529 Noninfective gastroenteritis and colitis, unspecified: Secondary | ICD-10-CM

## 2016-08-21 LAB — URINALYSIS COMPLETE WITH MICROSCOPIC (ARMC ONLY)
BILIRUBIN URINE: NEGATIVE
GLUCOSE, UA: NEGATIVE mg/dL
Ketones, ur: NEGATIVE mg/dL
NITRITE: POSITIVE — AB
Protein, ur: NEGATIVE mg/dL
SPECIFIC GRAVITY, URINE: 1.051 — AB (ref 1.005–1.030)
pH: 5 (ref 5.0–8.0)

## 2016-08-21 LAB — DIFFERENTIAL
Basophils Absolute: 0.1 10*3/uL (ref 0–0.1)
Basophils Relative: 0 %
EOS PCT: 0 %
Eosinophils Absolute: 0 10*3/uL (ref 0–0.7)
LYMPHS ABS: 1 10*3/uL (ref 1.0–3.6)
Lymphocytes Relative: 8 %
MONO ABS: 0.9 10*3/uL (ref 0.2–0.9)
MONOS PCT: 6 %
NEUTROS ABS: 11.8 10*3/uL — AB (ref 1.4–6.5)
Neutrophils Relative %: 86 %

## 2016-08-21 LAB — URINE DRUG SCREEN, QUALITATIVE (ARMC ONLY)
AMPHETAMINES, UR SCREEN: NOT DETECTED
Barbiturates, Ur Screen: NOT DETECTED
Benzodiazepine, Ur Scrn: NOT DETECTED
CANNABINOID 50 NG, UR ~~LOC~~: NOT DETECTED
COCAINE METABOLITE, UR ~~LOC~~: NOT DETECTED
MDMA (ECSTASY) UR SCREEN: NOT DETECTED
Methadone Scn, Ur: NOT DETECTED
OPIATE, UR SCREEN: NOT DETECTED
PHENCYCLIDINE (PCP) UR S: NOT DETECTED
Tricyclic, Ur Screen: NOT DETECTED

## 2016-08-21 LAB — CBC
HEMATOCRIT: 36.8 % (ref 35.0–47.0)
Hemoglobin: 11.7 g/dL — ABNORMAL LOW (ref 12.0–16.0)
MCH: 26.1 pg (ref 26.0–34.0)
MCHC: 31.9 g/dL — ABNORMAL LOW (ref 32.0–36.0)
MCV: 82 fL (ref 80.0–100.0)
Platelets: 479 10*3/uL — ABNORMAL HIGH (ref 150–440)
RBC: 4.49 MIL/uL (ref 3.80–5.20)
RDW: 14.8 % — ABNORMAL HIGH (ref 11.5–14.5)
WBC: 13.8 10*3/uL — AB (ref 3.6–11.0)

## 2016-08-21 LAB — COMPREHENSIVE METABOLIC PANEL
ALK PHOS: 125 U/L (ref 38–126)
ALT: 45 U/L (ref 14–54)
AST: 188 U/L — ABNORMAL HIGH (ref 15–41)
Albumin: 4.3 g/dL (ref 3.5–5.0)
Anion gap: 11 (ref 5–15)
BILIRUBIN TOTAL: 0.6 mg/dL (ref 0.3–1.2)
BUN: 21 mg/dL — ABNORMAL HIGH (ref 6–20)
CALCIUM: 9.8 mg/dL (ref 8.9–10.3)
CO2: 29 mmol/L (ref 22–32)
CREATININE: 1.17 mg/dL — AB (ref 0.44–1.00)
Chloride: 99 mmol/L — ABNORMAL LOW (ref 101–111)
GFR, EST AFRICAN AMERICAN: 54 mL/min — AB (ref >60)
GFR, EST NON AFRICAN AMERICAN: 46 mL/min — AB (ref >60)
Glucose, Bld: 153 mg/dL — ABNORMAL HIGH (ref 65–99)
Potassium: 2.7 mmol/L — CL (ref 3.5–5.1)
Sodium: 139 mmol/L (ref 135–145)
TOTAL PROTEIN: 8.4 g/dL — AB (ref 6.5–8.1)

## 2016-08-21 LAB — MAGNESIUM: MAGNESIUM: 1.8 mg/dL (ref 1.7–2.4)

## 2016-08-21 LAB — PROTIME-INR
INR: 0.92
Prothrombin Time: 12.3 seconds (ref 11.4–15.2)

## 2016-08-21 LAB — TROPONIN I

## 2016-08-21 LAB — LIPASE, BLOOD: Lipase: 27 U/L (ref 11–51)

## 2016-08-21 LAB — ETHANOL: Alcohol, Ethyl (B): <5 mg/dL (ref <5)

## 2016-08-21 LAB — GLUCOSE, CAPILLARY: GLUCOSE-CAPILLARY: 119 mg/dL — AB (ref 65–99)

## 2016-08-21 LAB — APTT: aPTT: 26 seconds (ref 24–36)

## 2016-08-21 MED ORDER — ACETAMINOPHEN 650 MG RE SUPP: 650.0000 mg | RECTAL | Status: DC | PRN

## 2016-08-21 MED ORDER — LOSARTAN POTASSIUM 50 MG PO TABS
100.0000 mg | ORAL_TABLET | Freq: Every day | ORAL | Status: DC
  Administered 2016-08-22 – 2016-08-23 (×2): 100 mg via ORAL
  Filled 2016-08-21 (×2): qty 2

## 2016-08-21 MED ORDER — CIPROFLOXACIN IN D5W 400 MG/200ML IV SOLN
400.0000 mg | Freq: Two times a day (BID) | INTRAVENOUS | Status: DC
  Administered 2016-08-22 – 2016-08-23 (×3): 400 mg via INTRAVENOUS
  Filled 2016-08-21 (×4): qty 200

## 2016-08-21 MED ORDER — SODIUM CHLORIDE 0.9 % IV SOLN
Freq: Once | INTRAVENOUS | Status: AC
  Administered 2016-08-21: 20:00:00 via INTRAVENOUS
  Filled 2016-08-21: qty 1000

## 2016-08-21 MED ORDER — POTASSIUM CHLORIDE CRYS ER 20 MEQ PO TBCR
40.0000 meq | EXTENDED_RELEASE_TABLET | Freq: Two times a day (BID) | ORAL | Status: AC
  Administered 2016-08-22 (×2): 40 meq via ORAL
  Filled 2016-08-21 (×2): qty 2

## 2016-08-21 MED ORDER — CARVEDILOL 25 MG PO TABS
25.0000 mg | ORAL_TABLET | Freq: Two times a day (BID) | ORAL | Status: DC
  Administered 2016-08-22 – 2016-08-23 (×3): 25 mg via ORAL
  Filled 2016-08-21 (×3): qty 1

## 2016-08-21 MED ORDER — SENNOSIDES-DOCUSATE SODIUM 8.6-50 MG PO TABS: 1.0000 | ORAL_TABLET | Freq: Every evening | ORAL | Status: DC | PRN

## 2016-08-21 MED ORDER — METRONIDAZOLE IN NACL 5-0.79 MG/ML-% IV SOLN
500.0000 mg | Freq: Three times a day (TID) | INTRAVENOUS | Status: DC
  Administered 2016-08-22 – 2016-08-23 (×4): 500 mg via INTRAVENOUS
  Filled 2016-08-21 (×6): qty 100

## 2016-08-21 MED ORDER — CIPROFLOXACIN HCL 500 MG PO TABS
500.0000 mg | ORAL_TABLET | Freq: Once | ORAL | Status: AC
  Administered 2016-08-21: 500 mg via ORAL
  Filled 2016-08-21: qty 1

## 2016-08-21 MED ORDER — ACETAMINOPHEN 325 MG PO TABS: 650.0000 mg | ORAL_TABLET | ORAL | Status: DC | PRN

## 2016-08-21 MED ORDER — FERROUS SULFATE 325 (65 FE) MG PO TABS
325.0000 mg | ORAL_TABLET | Freq: Every day | ORAL | Status: DC
  Administered 2016-08-22 – 2016-08-23 (×2): 325 mg via ORAL
  Filled 2016-08-21 (×2): qty 1

## 2016-08-21 MED ORDER — ENOXAPARIN SODIUM 40 MG/0.4ML ~~LOC~~ SOLN
40.0000 mg | SUBCUTANEOUS | Status: DC
  Administered 2016-08-22 (×2): 40 mg via SUBCUTANEOUS
  Filled 2016-08-21 (×2): qty 0.4

## 2016-08-21 MED ORDER — HYDROCHLOROTHIAZIDE 25 MG PO TABS
25.0000 mg | ORAL_TABLET | Freq: Every day | ORAL | Status: DC
  Administered 2016-08-22 – 2016-08-23 (×2): 25 mg via ORAL
  Filled 2016-08-21 (×2): qty 1

## 2016-08-21 MED ORDER — IOPAMIDOL (ISOVUE-370) INJECTION 76%
75.0000 mL | Freq: Once | INTRAVENOUS | Status: AC | PRN
  Administered 2016-08-21: 75 mL via INTRAVENOUS

## 2016-08-21 MED ORDER — POTASSIUM CHLORIDE CRYS ER 20 MEQ PO TBCR
40.0000 meq | EXTENDED_RELEASE_TABLET | Freq: Once | ORAL | Status: AC
  Administered 2016-08-21: 40 meq via ORAL
  Filled 2016-08-21: qty 2

## 2016-08-21 MED ORDER — STROKE: EARLY STAGES OF RECOVERY BOOK
Freq: Once | Status: AC
  Administered 2016-08-21: 23:00:00

## 2016-08-21 MED ORDER — GABAPENTIN 300 MG PO CAPS
300.0000 mg | ORAL_CAPSULE | Freq: Three times a day (TID) | ORAL | Status: DC
  Administered 2016-08-22 – 2016-08-23 (×5): 300 mg via ORAL
  Filled 2016-08-21 (×5): qty 1

## 2016-08-21 MED ORDER — METRONIDAZOLE 500 MG PO TABS
500.0000 mg | ORAL_TABLET | Freq: Once | ORAL | Status: AC
  Administered 2016-08-21: 500 mg via ORAL
  Filled 2016-08-21: qty 1

## 2016-08-21 MED ORDER — SODIUM CHLORIDE 0.9 % IV SOLN
INTRAVENOUS | Status: DC
  Administered 2016-08-21 – 2016-08-22 (×2): via INTRAVENOUS

## 2016-08-21 MED ORDER — ASPIRIN EC 81 MG PO TBEC
81.0000 mg | DELAYED_RELEASE_TABLET | Freq: Every day | ORAL | Status: DC
  Administered 2016-08-22 – 2016-08-23 (×2): 81 mg via ORAL
  Filled 2016-08-21 (×2): qty 1

## 2016-08-21 MED ORDER — ASPIRIN 81 MG PO CHEW
324.0000 mg | CHEWABLE_TABLET | Freq: Once | ORAL | Status: AC
  Administered 2016-08-21: 324 mg via ORAL
  Filled 2016-08-21: qty 4

## 2016-08-21 MED ORDER — AMLODIPINE BESYLATE 10 MG PO TABS
10.0000 mg | ORAL_TABLET | Freq: Every day | ORAL | Status: DC
  Administered 2016-08-22 – 2016-08-23 (×2): 10 mg via ORAL
  Filled 2016-08-21 (×2): qty 1

## 2016-08-21 MED ORDER — PANTOPRAZOLE SODIUM 40 MG PO TBEC
40.0000 mg | DELAYED_RELEASE_TABLET | Freq: Every day | ORAL | Status: DC
  Administered 2016-08-22 – 2016-08-23 (×2): 40 mg via ORAL
  Filled 2016-08-21 (×2): qty 1

## 2016-08-21 NOTE — ED Notes (Signed)
Pt transported to room 101 

## 2016-08-21 NOTE — Progress Notes (Signed)
Pharmacy Antibiotic Note  AIMEE RICHARDS is a 70 y.o. female admitted on 08/21/2016 with IAI.  Pharmacy has been consulted for ciprofloxacin dosing.  Plan: Ciprofloxacin 400 mg IV Q12H  Height: 5\' 4"  (162.6 cm) Weight: 170 lb (77.1 kg) IBW/kg (Calculated) : 54.7  Temp (24hrs), Avg:97.6 F (36.4 C), Min:97.6 F (36.4 C), Max:97.6 F (36.4 C)   Recent Labs Lab 08/21/16 1823  WBC 13.8*  CREATININE 1.17*    Estimated Creatinine Clearance: 45.6 mL/min (by C-G formula based on SCr of 1.17 mg/dL (H)).    Allergies  Allergen Reactions  . Lisinopril     Other reaction(s): Other (See Comments) Other Reaction: bad cough    Thank you for allowing pharmacy to be a part of this patient's care.  Laural Benes, Pharm.D., BCPS Clinical Pharmacist 08/21/2016 11:09 PM

## 2016-08-21 NOTE — ED Provider Notes (Signed)
[facility] Emergency Department Provider Note  ____________________________________________  Time seen: Approximately 6:29 PM  I have reviewed the triage vital signs and the nursing notes.   HISTORY  Chief Complaint Code Stroke   HPI Kendra Ritter is a 69 y.o. female history of colon cancer status post total colectomy with ostomy, CVA, hypertension and for evaluation of abdominal pain and difficulty finding words. Patient reports that at 4:30 PM she was standing up and she developed sudden onset of lower abdominal pain radiating to her back. She reports that she also had difficulty finding words which prompted her visit to the emergency room as patient has had 2 prior strokes. Patient does endorse mild weakness on her left side as residual from prior strokes. She is not on Plavix and is only on aspirin. She currently endorses mild, dull pain located in her lower abdomen, radiating to her back. She reports that the pain was sudden in nature and she was doing well before it all started. She no longer endorses any difficulty finding words at this time. Husband who was with the patient denies noticing facial droop, unilateral weakness or numbness, gait instability, or slurred speech. Patient endorses nausea but denies vomiting, headache, chest pain, shortness of breath, fever or chills, dysuria or hematuria, constipation, changes in her ostomy output, melena.  Past Medical History:  Diagnosis Date  . Anemia   . BPPV (benign paroxysmal positional vertigo)   . Colon cancer ([place]) 1998  . GERD (gastroesophageal reflux disease)   . H/O aortic valve replacement   . Heart murmur   . Hx of cervical cancer   . Hypertension   . Stroke [facility])    multiple strokes, 2010, 2011, 2013     Patient Active Problem List   Diagnosis Date Noted  . Chest pain 03/06/2014  . S/P MVR (mitral valve repair) 03/06/2014  . Murmur 03/06/2014  . SOB (shortness of breath) 03/06/2014  . HTN  (hypertension) 03/06/2014    Past Surgical History:  Procedure Laterality Date  . AORTIC VALVE REPLACEMENT  2008   [facility]  . APPENDECTOMY    . CARDIAC CATHETERIZATION  2008   [facility]  . COLOSTOMY  1998   After colon cancer   . partial hysterectomy     cervical cancer    Prior to Admission medications   Medication Sig Start Date End Date Taking? Authorizing Provider  amLODipine (NORVASC) 10 MG tablet Take 1 tablet (10 mg total) by mouth daily. 04/10/14  Yes Minna Merritts, MD  carvedilol (COREG) 25 MG tablet Take 25 mg by mouth 2 (two) times daily with a meal.   Yes Historical Provider, MD  ferrous sulfate 325 (65 FE) MG tablet Take 325 mg by mouth daily with breakfast.   Yes Historical Provider, MD  gabapentin (NEURONTIN) 300 MG capsule Take 300 mg by mouth 3 (three) times daily.  01/29/14  Yes Historical Provider, MD  hydrochlorothiazide (HYDRODIURIL) 25 MG tablet Take 25 mg by mouth daily.   Yes Historical Provider, MD  losartan (COZAAR) 100 MG tablet Take 100 mg by mouth daily.   Yes Historical Provider, MD  omeprazole (PRILOSEC) 20 MG capsule Take 20 mg by mouth daily.   Yes Historical Provider, MD    Allergies Lisinopril  Family History  Problem Relation Age of Onset  . Heart disease Mother   . Heart attack Mother   . Hypertension Mother   . Breast cancer Maternal Aunt     Social History Social  History  Substance Use Topics  . Smoking status: Never Smoker  . Smokeless tobacco: Never Used  . Alcohol use No    Review of Systems  Constitutional: Negative for fever. Eyes: Negative for visual changes. ENT: Negative for sore throat. Cardiovascular: Negative for chest pain. Respiratory: Negative for shortness of breath. Gastrointestinal: + lower abdominal pain and nausea. No vomiting or diarrhea. Genitourinary: Negative for dysuria. Musculoskeletal: Negative for back pain. Skin: Negative for rash. Neurological: Negative for headaches, weakness or numbness. +  difficulty finding words  ____________________________________________   PHYSICAL EXAM:  VITAL SIGNS: ED Triage Vitals  Enc Vitals Group     BP 08/21/16 1808 (!) 153/88     Pulse Rate 08/21/16 1808 88     Resp 08/21/16 1808 18     Temp 08/21/16 1808 97.6 F (36.4 C)     Temp Source 08/21/16 1808 Oral     SpO2 08/21/16 1808 94 %     Weight 08/21/16 1809 170 lb (77.1 kg)     Height 08/21/16 1809 5\' 4"  (1.626 m)     Head Circumference --      Peak Flow --      Pain Score 08/21/16 1809 7     Pain Loc --      Pain Edu? --      Excl. in [place]? --     Constitutional: Alert and oriented. Well appearing and in no apparent distress. HEENT:      Head: Normocephalic and atraumatic.         Eyes: Conjunctivae are normal. Sclera is non-icteric. EOMI. PERRL      Mouth/Throat: Mucous membranes are moist.       Neck: Supple with no signs of meningismus. Cardiovascular: Regular rate and rhythm. No murmurs, gallops, or rubs. 2+ symmetrical distal pulses are present in all extremities. No JVD. Respiratory: Normal respiratory effort. Lungs are clear to auscultation bilaterally. No wheezes, crackles, or rhonchi.  Gastrointestinal: Soft, ttp over the RLQ, and non distended with positive bowel sounds. No rebound or guarding. Musculoskeletal: Nontender with normal range of motion in all extremities. No edema, cyanosis, or erythema of extremities. Neurologic: Normal speech and language. A & O x3, PERRL, no nystagmus, CN II-XII intact, motor testing reveals good tone and bulk throughout. There is no evidence of pronator drift or dysmetria. Muscle strength is 5/5 throughout. Deep tendon reflexes are 2+ throughout with downgoing toes. Sensory examination is intact. Gait deferred Skin: Skin is warm, dry and intact. No rash noted. Psychiatric: Mood and affect are normal. Speech and behavior are normal.  ____________________________________________   LABS (all labs ordered are listed, but only abnormal  results are displayed)  Labs Reviewed  CBC - Abnormal; Notable for the following:       Result Value   WBC 13.8 (*)    Hemoglobin 11.7 (*)    MCHC 31.9 (*)    RDW 14.8 (*)    Platelets 479 (*)    All other components within normal limits  DIFFERENTIAL - Abnormal; Notable for the following:    Neutro Abs 11.8 (*)    All other components within normal limits  COMPREHENSIVE METABOLIC PANEL - Abnormal; Notable for the following:    Potassium 2.7 (*)    Chloride 99 (*)    Glucose, Bld 153 (*)    BUN 21 (*)    Creatinine, Ser 1.17 (*)    Total Protein 8.4 (*)    AST 188 (*)    GFR calc non  Af Amer 46 (*)    GFR calc Af Amer 54 (*)    All other components within normal limits  URINALYSIS COMPLETEWITH MICROSCOPIC ([facility] ONLY) - Abnormal; Notable for the following:    Color, Urine YELLOW (*)    APPearance CLEAR (*)    Specific Gravity, Urine 1.051 (*)    Hgb urine dipstick 1+ (*)    Nitrite POSITIVE (*)    Leukocytes, UA 1+ (*)    Bacteria, UA FEW (*)    Squamous Epithelial / LPF 0-5 (*)    All other components within normal limits  ETHANOL  PROTIME-INR  APTT  TROPONIN I  LIPASE, BLOOD  MAGNESIUM  URINE DRUG SCREEN, QUALITATIVE ([facility] ONLY)   ____________________________________________  EKG  ED ECG REPORT I, Rudene Re, the attending physician, personally viewed and interpreted this ECG.  Normal sinus rhythm, rate of 81, frequent PVCs, normal intervals, normal axis, no ST elevations or depressions. Unchanged from prior ____________________________________________  RADIOLOGY  Head CT: Negative  CT a/p: Question mild edema in the wall of the proximal transverse colon, incompletely visualized on this abdomen only exam. Infectious or inflammatory colitis would be a consideration although limited visualization limits assessment.  Small hiatal hernia.   ____________________________________________   PROCEDURES  Procedure(s) performed:  None Procedures Critical Care performed:  None ____________________________________________   INITIAL IMPRESSION / ASSESSMENT AND PLAN / ED COURSE   70 y.o. female history of colon cancer status post total colectomy with ostomy, CVA, hypertension and for evaluation of sudden onset of lower abdominal pain radiating to the back associated with nausea and difficulty finding words. Patient is currently neurologically intact with resolution of her difficulty finding words. Head CT is negative for stroke. Patient no longer on Plavix. Tell neurology has been consult. Patient has significant tenderness to palpation in her right lower quadrant. Due to the nature of her pain with sudden onset and associated with neurological deficits, CT angio of the abdomen has been ordered to rule out dissection.  Clinical Course as of Aug 21 2028  Sat Aug 21, 2016  1946 CT concerning for colitis, will give cipro and flagyl. Labs showing K 2.7, patient given PO and IV supplementation. Teleneurology recommended admission for TIA work up. Will discuss with hospitalist for admission.  [CV]    Clinical Course User Index [CV] Rudene Re, MD    Pertinent labs & imaging results that were available during my care of the patient were reviewed by me and considered in my medical decision making (see chart for details).    ____________________________________________   FINAL CLINICAL IMPRESSION(S) / ED DIAGNOSES  Final diagnoses:  Transient cerebral ischemia, unspecified type  Colitis  Hypokalemia  Urinary tract infection without hematuria, site unspecified      NEW MEDICATIONS STARTED DURING THIS VISIT:  New Prescriptions   No medications on file     Note:  This document was prepared using Dragon voice recognition software and may include unintentional dictation errors.    Rudene Re, MD 08/21/16 2029

## 2016-08-21 NOTE — ED Notes (Signed)
Critical potassium, MD made aware 

## 2016-08-21 NOTE — ED Triage Notes (Signed)
Husband states 1 hour lost ability to speak, now can speak but appears weak and L grip weak.

## 2016-08-22 ENCOUNTER — Inpatient Hospital Stay: Payer: Medicare Other

## 2016-08-22 ENCOUNTER — Inpatient Hospital Stay (HOSPITAL_COMMUNITY)
Admit: 2016-08-22 | Discharge: 2016-08-22 | Disposition: A | Discharge status: Home / Self Care | Payer: Medicare Other | Attending: Family Medicine | Admitting: Family Medicine

## 2016-08-22 DIAGNOSIS — G451 Carotid artery syndrome (hemispheric): Secondary | ICD-10-CM | POA: Diagnosis not present

## 2016-08-22 DIAGNOSIS — R531 Weakness: Secondary | ICD-10-CM | POA: Diagnosis not present

## 2016-08-22 DIAGNOSIS — G459 Transient cerebral ischemic attack, unspecified: Secondary | ICD-10-CM | POA: Diagnosis not present

## 2016-08-22 LAB — URINALYSIS COMPLETE WITH MICROSCOPIC (ARMC ONLY)
BACTERIA UA: NONE SEEN
Bilirubin Urine: NEGATIVE
Glucose, UA: NEGATIVE mg/dL
Ketones, ur: NEGATIVE mg/dL
Nitrite: NEGATIVE
PROTEIN: NEGATIVE mg/dL
Specific Gravity, Urine: 1.026 (ref 1.005–1.030)
pH: 6 (ref 5.0–8.0)

## 2016-08-22 LAB — LIPID PANEL
CHOL/HDL RATIO: 2.2 ratio
Cholesterol: 119 mg/dL (ref 0–200)
HDL: 53 mg/dL (ref >40)
LDL CALC: 39 mg/dL (ref 0–99)
Triglycerides: 136 mg/dL (ref <150)
VLDL: 27 mg/dL (ref 0–40)

## 2016-08-22 LAB — TROPONIN I
Troponin I: <0.03 ng/mL (ref <0.03)
Troponin I: <0.03 ng/mL (ref <0.03)
Troponin I: <0.03 ng/mL (ref <0.03)

## 2016-08-22 LAB — ECHOCARDIOGRAM COMPLETE
Height: 64 in
WEIGHTICAEL: 2833.6 [oz_av]

## 2016-08-22 LAB — URINE DRUG SCREEN, QUALITATIVE (ARMC ONLY)
AMPHETAMINES, UR SCREEN: NOT DETECTED
BENZODIAZEPINE, UR SCRN: NOT DETECTED
Barbiturates, Ur Screen: NOT DETECTED
COCAINE METABOLITE, UR ~~LOC~~: NOT DETECTED
Cannabinoid 50 Ng, Ur ~~LOC~~: NOT DETECTED
MDMA (ECSTASY) UR SCREEN: NOT DETECTED
METHADONE SCREEN, URINE: NOT DETECTED
OPIATE, UR SCREEN: NOT DETECTED
PHENCYCLIDINE (PCP) UR S: NOT DETECTED
Tricyclic, Ur Screen: NOT DETECTED

## 2016-08-22 LAB — POTASSIUM: Potassium: 3.1 mmol/L — ABNORMAL LOW (ref 3.5–5.1)

## 2016-08-22 LAB — MAGNESIUM: MAGNESIUM: 1.7 mg/dL (ref 1.7–2.4)

## 2016-08-22 NOTE — Consult Note (Signed)
Reason for Consult:confusion  Referring Physician: Dr. Margaretmary Eddy  CC: difficulty finding words and LLQ pain   HPI: Kendra Ritter is an 70 y.o. female  with a known history of Hypertension, CVA, remote colon cancer status post colectomy with colostomy presents to the emergency department for evaluation of Altered mental status and left lower quadrant abdominal pain. Pt  states yesterday she had sudden onset of difficulty with word finding. Her speech was clear but she was unable to express what she wanted to say. She reports that the symptoms resolved spontaneously and is asymptomatic at this time Pt also reports left lower quadrant abdominal pain that radiated around to her back which is now mostly resolved.   Past Medical History:  Diagnosis Date  . Anemia   . BPPV (benign paroxysmal positional vertigo)   . Colon cancer ([place]) 1998  . GERD (gastroesophageal reflux disease)   . H/O aortic valve replacement   . Heart murmur   . Hx of cervical cancer   . Hypertension   . Stroke [facility])    multiple strokes, 2010, 2011, 2013     Past Surgical History:  Procedure Laterality Date  . AORTIC VALVE REPLACEMENT  2008   [facility]  . APPENDECTOMY    . CARDIAC CATHETERIZATION  2008   [facility]  . COLOSTOMY  1998   After colon cancer   . partial hysterectomy     cervical cancer    Family History  Problem Relation Age of Onset  . Heart disease Mother   . Heart attack Mother   . Hypertension Mother   . Breast cancer Maternal Aunt     Social History:  reports that she has never smoked. She has never used smokeless tobacco. She reports that she does not drink alcohol or use drugs.  Allergies  Allergen Reactions  . Lisinopril     Other reaction(s): Other (See Comments) Other Reaction: bad cough    Medications: I have reviewed the patient's current medications.  ROS: History obtained from the patient  General ROS: negative for - chills, fatigue, fever, night sweats, weight gain or weight  loss Psychological ROS: negative for - behavioral disorder, hallucinations, memory difficulties, mood swings or suicidal ideation Ophthalmic ROS: negative for - blurry vision, double vision, eye pain or loss of vision ENT ROS: negative for - epistaxis, nasal discharge, oral lesions, sore throat, tinnitus or vertigo Allergy and Immunology ROS: negative for - hives or itchy/watery eyes Hematological and Lymphatic ROS: negative for - bleeding problems, bruising or swollen lymph nodes Endocrine ROS: negative for - galactorrhea, hair pattern changes, polydipsia/polyuria or temperature intolerance Respiratory ROS: negative for - cough, hemoptysis, shortness of breath or wheezing Cardiovascular ROS: negative for - chest pain, dyspnea on exertion, edema or irregular heartbeat Gastrointestinal ROS: left lower quadrant pain  Genito-Urinary ROS: negative for - dysuria, hematuria, incontinence or urinary frequency/urgency Musculoskeletal ROS: negative for - joint swelling or muscular weakness Neurological ROS: as noted in HPI Dermatological ROS: negative for rash and skin lesion changes  Physical Examination: Blood pressure (!) 139/59, pulse 66, temperature 98.1 F (36.7 C), temperature source Oral, resp. rate 18, height 5\' 4"  (1.626 m), weight 80.3 kg (177 lb 1.6 oz), SpO2 100 %.    Neurological Examination Mental Status: Alert, oriented, thought content appropriate.  Speech fluent without evidence of aphasia.  Able to follow 3 step commands without difficulty. Cranial Nerves: II: Discs flat bilaterally; Visual fields grossly normal, pupils equal, round, reactive to light and accommodation III,IV, VI: ptosis  not present, extra-ocular motions intact bilaterally V,VII: smile symmetric, facial light touch sensation normal bilaterally VIII: hearing normal bilaterally IX,X: gag reflex present XI: bilateral shoulder shrug XII: midline tongue extension Motor: Right : Upper extremity   5/5    Left:      Upper extremity   5/5  Lower extremity   5/5     Lower extremity   5/5 Tone and bulk:normal tone throughout; no atrophy noted Sensory: Pinprick and light touch intact throughout, bilaterally Deep Tendon Reflexes: 1+ and symmetric throughout Plantars: Right: downgoing   Left: downgoing Cerebellar: normal finger-to-nose, normal rapid alternating movements and normal heel-to-shin test Gait: not tested       Laboratory Studies:   Basic Metabolic Panel:  Recent Labs Lab 08/21/16 1823 08/22/16 1131  NA 139  --   K 2.7*  --   CL 99*  --   CO2 29  --   GLUCOSE 153*  --   BUN 21*  --   CREATININE 1.17*  --   CALCIUM 9.8  --   MG 1.8 1.7    Liver Function Tests:  Recent Labs Lab 08/21/16 1823  AST 188*  ALT 45  ALKPHOS 125  BILITOT 0.6  PROT 8.4*  ALBUMIN 4.3    Recent Labs Lab 08/21/16 1823  LIPASE 27   No results for input(s): AMMONIA in the last 168 hours.  CBC:  Recent Labs Lab 08/21/16 1823  WBC 13.8*  NEUTROABS 11.8*  HGB 11.7*  HCT 36.8  MCV 82.0  PLT 479*    Cardiac Enzymes:  Recent Labs Lab 08/21/16 1823 08/21/16 2307 08/22/16 0504 08/22/16 1131  TROPONINI <0.03 <0.03 <0.03 <0.03    BNP: Invalid input(s): POCBNP  CBG:  Recent Labs Lab 08/21/16 2305  GLUCAP 119*    Microbiology: No results found for this or any previous visit.  Coagulation Studies:  Recent Labs  08/21/16 1823  LABPROT 12.3  INR 0.92    Urinalysis:  Recent Labs Lab 08/21/16 1950 08/22/16 0100  COLORURINE YELLOW* STRAW*  LABSPEC 1.051* 1.026  PHURINE 5.0 6.0  GLUCOSEU NEGATIVE NEGATIVE  HGBUR 1+* 1+*  BILIRUBINUR NEGATIVE NEGATIVE  KETONESUR NEGATIVE NEGATIVE  PROTEINUR NEGATIVE NEGATIVE  NITRITE POSITIVE* NEGATIVE  LEUKOCYTESUR 1+* TRACE*    Lipid Panel:     Component Value Date/Time   CHOL 119 08/22/2016 0504   TRIG 136 08/22/2016 0504   HDL 53 08/22/2016 0504   CHOLHDL 2.2 08/22/2016 0504   VLDL 27 08/22/2016 0504   LDLCALC 39  08/22/2016 0504    HgbA1C: No results found for: HGBA1C  Urine Drug Screen:     Component Value Date/Time   LABOPIA NONE DETECTED 08/22/2016 0100   COCAINSCRNUR NONE DETECTED 08/22/2016 0100   LABBENZ NONE DETECTED 08/22/2016 0100   AMPHETMU NONE DETECTED 08/22/2016 0100   THCU NONE DETECTED 08/22/2016 0100   LABBARB NONE DETECTED 08/22/2016 0100    Alcohol Level:  Recent Labs Lab 08/21/16 [place] <5    Other results: EKG: normal EKG, normal sinus rhythm, unchanged from previous tracings.  Imaging: Dg Chest 2 View  Result Date: 08/21/2016 CLINICAL DATA:  Sudden onset lower abdominal pain radiating to the back. EXAM: CHEST  2 VIEW COMPARISON:  01/14/2015 FINDINGS: The cardio pericardial silhouette is enlarged. Status post cardiac valve replacement. No pulmonary edema or focal airspace consolidation. No evidence for pleural effusion. Cerclage wires overlie the right chest. The visualized bony structures of the thorax are intact. Telemetry leads overlie the chest. IMPRESSION:  Stable exam.  Cardiomegaly without acute cardiopulmonary findings. Electronically Signed   By: Misty Stanley M.D.   On: 08/21/2016 23:58   Ct Angio Abdomen W And/or Wo Contrast  Result Date: 08/21/2016 CLINICAL DATA:  Acute onset of abdominal pain and left-sided weakness today at 4:30 p.m. History of cervical and colon cancer. EXAM: CT ANGIOGRAPHY ABDOMEN TECHNIQUE: Multidetector CT imaging of the abdomen was performed using the standard protocol during bolus administration of intravenous contrast. Multiplanar reconstructed images and MIPs were obtained and reviewed to evaluate the vascular anatomy. CONTRAST:  75 cc Isovue 370 COMPARISON:  CT scan 08/13/2011 FINDINGS: VASCULAR Aorta: No aneurysm. No dissection. Minimal atherosclerotic calcification. No stenosis. Celiac: Widely patent.  Normal branch vessel anatomy. SMA: Widely patent. Renals: Single widely patent right renal artery noted. Calcific plaque noted at  the origin of the left renal artery which does opacify. Left kidney appears atrophic raising the question of flow limiting left renal artery stenosis. IMA: Widely patent. Veins: Portal venous and mesenteric venous anatomy not well demonstrated given bolus timing. IVC is non-opacified. Review of the MIP images confirms the above findings. NON-VASCULAR Hepatobiliary: Liver has normal imaging features on arterial phase exam. Gallbladder surgically absent. No intrahepatic or extrahepatic biliary dilation. Pancreas: No focal mass lesion. No dilatation of the main duct. No intraparenchymal cyst. No peripancreatic edema. Spleen: No splenomegaly. No focal mass lesion. Adrenals/Urinary Tract: Mild left adrenal thickening without nodule or mass. Right adrenal gland unremarkable. No evidence for enhancing right renal lesion. Left kidney appears atrophic without discernible enhancing lesion. No hydronephrosis. Stomach/Bowel: Small hiatal hernia noted. Stomach otherwise unremarkable. Duodenum is normally positioned as is the ligament of Treitz. There appears to be some mild wall edema in the proximal transverse colon although this region is incompletely visualized. Left lower quadrant ostomy is not visualized on prior study represented date sigmoid end colostomy with parastomal herniation of small bowel loops. Lymphatic: There is no gastrohepatic or hepatoduodenal ligament lymphadenopathy. No intraperitoneal or retroperitoneal lymphadenopathy. Other: No intraperitoneal free fluid. Musculoskeletal: Bone windows reveal no worrisome lytic or sclerotic osseous lesions. IMPRESSION: VASCULAR Unremarkable aside from potential flow limiting plaque at the origin of the left renal artery given associated left renal atrophy. NON-VASCULAR Question mild edema in the wall of the proximal transverse colon, incompletely visualized on this abdomen only exam. Infectious or inflammatory colitis would be a consideration although limited  visualization limits assessment. Small hiatal hernia. Electronically Signed   By: Misty Stanley M.D.   On: 08/21/2016 19:32   Mr Brain Wo Contrast  Result Date: 08/22/2016 CLINICAL DATA:  History of hypertension and stroke. Altered mental status with speech disturbance. EXAM: MRI HEAD WITHOUT CONTRAST MRA HEAD WITHOUT CONTRAST TECHNIQUE: Multiplanar, multiecho pulse sequences of the brain and surrounding structures were obtained without intravenous contrast. Angiographic images of the head were obtained using MRA technique without contrast. COMPARISON:  Head CT 08/21/2016 FINDINGS: MRI HEAD FINDINGS Brain: Diffusion imaging does not show any acute or subacute infarction. The brainstem and cerebellum are normal. Cerebral hemispheres show moderate chronic small-vessel ischemic changes affecting the deep and subcortical white matter. No cortical or large vessel territory infarction. No mass lesion, hemorrhage, hydrocephalus or extra-axial collection. Vascular: Major vessels at the base of the brain show flow. Skull and upper cervical spine: Negative Sinuses/Orbits: Clear/ normal Other: None significant MRA HEAD FINDINGS Both internal carotid arteries are widely patent through the skullbase and siphon regions. The anterior and middle cerebral vessels are patent without proximal stenosis, aneurysm or vascular malformation. Both  vertebral arteries are widely patent to the basilar. No basilar stenosis. Posterior circulation branch vessels appear normal. IMPRESSION: No acute finding. Moderate chronic small-vessel ischemic changes of the cerebral hemispheric white matter. Normal intracranial MR angiography of the large and medium size vessels. Electronically Signed   By: Nelson Chimes M.D.   On: 08/22/2016 11:43   US Carotid Bilateral (at [facility] Only)  Result Date: 08/22/2016 CLINICAL DATA:  Left-sided weakness, TIA, hypertension and history of coronary artery disease. EXAM: BILATERAL CAROTID DUPLEX ULTRASOUND  TECHNIQUE: Pearline Cables scale imaging, color Doppler and duplex ultrasound were performed of bilateral carotid and vertebral arteries in the neck. COMPARISON:  None. FINDINGS: Criteria: Quantification of carotid stenosis is based on velocity parameters that correlate the residual internal carotid diameter with NASCET-based stenosis levels, using the diameter of the distal internal carotid lumen as the denominator for stenosis measurement. The following velocity measurements were obtained: RIGHT ICA:  71/15 cm/sec CCA:  [id number] cm/sec SYSTOLIC ICA/CCA RATIO:  0.9 DIASTOLIC ICA/CCA RATIO:  1.0 ECA:  70 cm/sec LEFT ICA:  78/15 cm/sec CCA:  [id number] cm/sec SYSTOLIC ICA/CCA RATIO:  0.9 DIASTOLIC ICA/CCA RATIO:  0.9 ECA:  89 cm/sec RIGHT CAROTID ARTERY: Minimal partially calcified plaque at the level of the carotid bulb. No evidence of ICA plaque or stenosis. RIGHT VERTEBRAL ARTERY: Antegrade flow with normal waveform and velocity. LEFT CAROTID ARTERY: No focal plaque identified. No evidence of left-sided carotid stenosis. LEFT VERTEBRAL ARTERY: Antegrade flow with normal waveform and velocity. IMPRESSION: Minimal plaque in the right carotid bulb. No evidence of bilateral ICA plaque or stenosis. Electronically Signed   By: Aletta Edouard M.D.   On: 08/22/2016 11:10   Mr Jodene Nam Head/brain [id number] Cm  Result Date: 08/22/2016 CLINICAL DATA:  History of hypertension and stroke. Altered mental status with speech disturbance. EXAM: MRI HEAD WITHOUT CONTRAST MRA HEAD WITHOUT CONTRAST TECHNIQUE: Multiplanar, multiecho pulse sequences of the brain and surrounding structures were obtained without intravenous contrast. Angiographic images of the head were obtained using MRA technique without contrast. COMPARISON:  Head CT 08/21/2016 FINDINGS: MRI HEAD FINDINGS Brain: Diffusion imaging does not show any acute or subacute infarction. The brainstem and cerebellum are normal. Cerebral hemispheres show moderate chronic small-vessel ischemic changes  affecting the deep and subcortical white matter. No cortical or large vessel territory infarction. No mass lesion, hemorrhage, hydrocephalus or extra-axial collection. Vascular: Major vessels at the base of the brain show flow. Skull and upper cervical spine: Negative Sinuses/Orbits: Clear/ normal Other: None significant MRA HEAD FINDINGS Both internal carotid arteries are widely patent through the skullbase and siphon regions. The anterior and middle cerebral vessels are patent without proximal stenosis, aneurysm or vascular malformation. Both vertebral arteries are widely patent to the basilar. No basilar stenosis. Posterior circulation branch vessels appear normal. IMPRESSION: No acute finding. Moderate chronic small-vessel ischemic changes of the cerebral hemispheric white matter. Normal intracranial MR angiography of the large and medium size vessels. Electronically Signed   By: Nelson Chimes M.D.   On: 08/22/2016 11:43   Ct Head Code Stroke W/o Cm  Result Date: 08/21/2016 CLINICAL DATA:  Code stroke. Left-sided weakness. Colon cancer and cervical cancer. EXAM: CT HEAD WITHOUT CONTRAST TECHNIQUE: Contiguous axial images were obtained from the base of the skull through the vertex without intravenous contrast. COMPARISON:  CT head 12/25/2008 FINDINGS: Brain: No evidence of acute infarction, hemorrhage, hydrocephalus, extra-axial collection or mass lesion/mass effect. Vascular: No hyperdense vessel or unexpected calcification. Skull: Negative Sinuses/Orbits: Negative Other: None ASPECTS ([place] Stroke  Program Early CT Score) - Ganglionic level infarction (caudate, lentiform nuclei, internal capsule, insula, M1-M3 cortex): 7 - Supraganglionic infarction (M4-M6 cortex): 3 Total score (0-10 with 10 being normal): 10 IMPRESSION: 1. Negative CT head 2. ASPECTS is 10 3. These results were called by telephone at the time of interpretation on 08/21/2016 at 6:24 pm to Dr. Lenise Arena , who verbally acknowledged  these results. Electronically Signed   By: Franchot Gallo M.D.   On: 08/21/2016 18:25     Assessment/Plan:   70 y.o. female  with a known history of Hypertension, CVA, remote colon cancer status post colectomy with colostomy presents to the emergency department for evaluation of Altered mental status and left lower quadrant abdominal pain. Pt  states yesterday she had sudden onset of difficulty with word finding. Her speech was clear but she was unable to express what she wanted to say. She reports that the symptoms resolved spontaneously and is asymptomatic at this time Pt also reports left lower quadrant abdominal pain that radiated around to her back which is now mostly resolved.  Pt states close to baseline  Imaging of brain no acute abnormality No further imaging from neuro stand point Call with questions 08/22/2016, 4:20 PM

## 2016-08-22 NOTE — H&P (Signed)
[place] @ [facility] Admission History and Physical Harvie Bridge, D.O.  ---------------------------------------------------------------------------------------------------------------------   PATIENT NAME: Kendra Ritter MR#: [id number] DATE OF BIRTH: 11/13/1945 DATE OF ADMISSION: 08/21/2016 PRIMARY CARE PHYSICIAN: Letta Median, MD  REQUESTING/REFERRING PHYSICIAN: ED Dr. Alfred Levins  CHIEF COMPLAINT: Chief Complaint  Patient presents with  . Code Stroke    HISTORY OF PRESENT ILLNESS: Kendra Ritter is a 70 y.o. 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 Her speech was clear but she was unable to express what she wanted to say. She reports that the symptoms resolved spontaneously and is asymptomatic at this time. She denied any other associated neurologic symptoms such as headache, dizziness, slurred speech,  weakness, paresthesias, gait disturbance or vision changes.  Regarding her abdominal pain she reports sudden onset of left lower quadrant abdominal pain that radiated around to her back which is now mostly resolved. She denies any associated change in stool, dysuria, pelvic pain, fevers, chills. She reports that her ostomy is functioning.   Otherwise there has been no change in status. Patient has been taking medication as prescribed and there has been no recent change in medication or diet.  There has been no recent illness, travel or sick contacts.    Patient denies fevers/chills, weakness, dizziness, chest pain, shortness of breath, N/V/C/D, abdominal pain, dysuria/frequency, changes in mental status.   EMS/ED COURSE:    In the emergency department she received Cipro, Flagyl, aspirin.  PAST MEDICAL  HISTORY: Past Medical History:  Diagnosis Date  . Anemia   . BPPV (benign paroxysmal positional vertigo)   . Colon cancer ([place]) 1998  . GERD (gastroesophageal reflux disease)   . H/O aortic valve replacement   . Heart murmur   . Hx of cervical cancer   . Hypertension   . Stroke [facility])    multiple strokes, 2010, 2011, 2013       PAST SURGICAL HISTORY: Past Surgical History:  Procedure Laterality Date  . AORTIC VALVE REPLACEMENT  2008   [facility]  . APPENDECTOMY    . CARDIAC CATHETERIZATION  2008   [facility]  . COLOSTOMY  1998   After colon cancer   . partial hysterectomy     cervical cancer      SOCIAL HISTORY: Social History  Substance Use Topics  . Smoking status: Never Smoker  . Smokeless tobacco: Never Used  . Alcohol use No      FAMILY HISTORY: Family History  Problem Relation Age of Onset  . Heart disease Mother   . Heart attack Mother   . Hypertension Mother   . Breast cancer Maternal Aunt      MEDICATIONS AT HOME: Prior to Admission medications   Medication Sig Start Date End Date Taking? Authorizing Provider  amLODipine (NORVASC) 10 MG tablet Take 1 tablet (10 mg total) by mouth daily. 04/10/14  Yes Minna Merritts, MD  carvedilol (COREG) 25 MG tablet Take 25 mg by mouth 2 (two) times daily with a meal.   Yes Historical Provider, MD  ferrous sulfate 325 (65 FE) MG tablet Take 325 mg by mouth daily with breakfast.   Yes Historical Provider, MD  gabapentin (NEURONTIN) 300 MG capsule Take 300 mg by mouth 3 (three) times daily.  01/29/14  Yes Historical Provider, MD  hydrochlorothiazide (HYDRODIURIL) 25 MG tablet Take 25 mg by mouth daily.   Yes Historical Provider, MD  losartan (COZAAR) 100 MG tablet Take 100 mg by mouth daily.   Yes Historical Provider, MD  omeprazole (PRILOSEC) 20 MG capsule Take 20 mg by mouth daily.   Yes Historical Provider, MD      DRUG ALLERGIES: Allergies  Allergen Reactions  . Lisinopril     Other reaction(s): Other (See  Comments) Other Reaction: bad cough     REVIEW OF SYSTEMS: CONSTITUTIONAL: No fever/chills, fatigue, weakness, weight gain/loss, headache EYES: No blurry or double vision. ENT: No tinnitus, postnasal drip, redness or soreness of the oropharynx. RESPIRATORY: No cough, wheeze, hemoptysis, dyspnea. CARDIOVASCULAR: No chest pain, orthopnea, palpitations, syncope. GASTROINTESTINAL: No nausea, vomiting, constipation, diarrhea, Positive abdominal pain, negative  hematemesis, melena or hematochezia. GENITOURINARY: No dysuria or hematuria. ENDOCRINE: No polyuria or nocturia. No heat or cold intolerance. HEMATOLOGY: No anemia, bruising, bleeding. INTEGUMENTARY: No rashes, ulcers, lesions. MUSCULOSKELETAL: No arthritis, swelling, gout. NEUROLOGIC: positive difficulty with word finding. No numbness, tingling, weakness or ataxia. No seizure-type activity. PSYCHIATRIC: No anxiety, depression, insomnia.  PHYSICAL EXAMINATION: VITAL SIGNS: Blood pressure (!) 165/76, pulse 79, temperature 98.1 F (36.7 C), temperature source Oral, resp. rate 18, height 5\' 4"  (1.626 m), weight 80.3 kg (177 lb 1.6 oz), SpO2 99 %.  GENERAL: 70 y.o.-year old black female patient, well-developed, well-nourished lying in the bed in no acute distress.  Pleasant and cooperative.   HEENT: Head atraumatic, normocephalic. Pupils equal, round, reactive to light and accommodation. No scleral icterus. Extraocular muscles intact. Nares are patent. Oropharynx is clear. Mucus membranes moist. NECK: Supple, full range of motion. No JVD, no bruit heard. No thyroid enlargement, no tenderness, no cervical lymphadenopathy. CHEST: Normal breath sounds bilaterally. No wheezing, rales, rhonchi or crackles. No use of accessory muscles of respiration.  No reproducible chest wall tenderness.  CARDIOVASCULAR: S1, S2 normal. No murmurs, rubs, or gallops. Cap refill <2 seconds. ABDOMEN: Soft, nontender, nondistended.  colostomy in place with soft  brown stools. No rebound, guarding, rigidity. Normoactive bowel sounds present in all four quadrants. No organomegaly or mass. EXTREMITIES: Full range of motion. No pedal edema, cyanosis, or clubbing. NEUROLOGIC: Cranial nerves II through XII are grossly intact with no focal sensorimotor deficit. Clear speech. Muscle strength 5/5 in all extremities. Sensation intact. Gait not checked. PSYCHIATRIC: The patient is alert and oriented x 3. Normal affect, mood, thought content. SKIN: Warm, dry, and intact without obvious rash, lesion, or ulcer.  LABORATORY PANEL:  CBC  Recent Labs Lab 08/21/16 1823  WBC 13.8*  HGB 11.7*  HCT 36.8  PLT 479*   ----------------------------------------------------------------------------------------------------------------- Chemistries  Recent Labs Lab 08/21/16 1823  NA 139  K 2.7*  CL 99*  CO2 29  GLUCOSE 153*  BUN 21*  CREATININE 1.17*  CALCIUM 9.8  MG 1.8  AST 188*  ALT 45  ALKPHOS 125  BILITOT 0.6   ------------------------------------------------------------------------------------------------------------------ Cardiac Enzymes  Recent Labs Lab 08/21/16 2307  TROPONINI <0.03   ------------------------------------------------------------------------------------------------------------------  RADIOLOGY: Dg Chest 2 View  Result Date: 08/21/2016 CLINICAL DATA:  Sudden onset lower abdominal pain radiating to the back. EXAM: CHEST  2 VIEW COMPARISON:  01/14/2015 FINDINGS: The cardio pericardial silhouette is enlarged. Status post cardiac valve replacement. No pulmonary edema or focal airspace consolidation. No evidence for pleural effusion. Cerclage wires overlie the right chest. The visualized bony structures of the thorax are intact. Telemetry leads overlie the chest. IMPRESSION: Stable exam.  Cardiomegaly without acute cardiopulmonary findings.  Electronically Signed   By: Misty Stanley M.D.   On: 08/21/2016 23:58   Ct Angio Abdomen W  And/or Wo Contrast  Result Date: 08/21/2016 CLINICAL DATA:  Acute onset of abdominal pain and left-sided weakness today at 4:30 p.m. History of cervical and colon cancer. EXAM: CT ANGIOGRAPHY ABDOMEN TECHNIQUE: Multidetector CT imaging of the abdomen was performed using the standard protocol during bolus administration of intravenous contrast. Multiplanar reconstructed images and MIPs were obtained and reviewed to evaluate the vascular anatomy. CONTRAST:  75 cc Isovue 370 COMPARISON:  CT scan 08/13/2011 FINDINGS: VASCULAR Aorta: No aneurysm. No dissection. Minimal atherosclerotic calcification. No stenosis. Celiac: Widely patent.  Normal branch vessel anatomy. SMA: Widely patent. Renals: Single widely patent right renal artery noted. Calcific plaque noted at the origin of the left renal artery which does opacify. Left kidney appears atrophic raising the question of flow limiting left renal artery stenosis. IMA: Widely patent. Veins: Portal venous and mesenteric venous anatomy not well demonstrated given bolus timing. IVC is non-opacified. Review of the MIP images confirms the above findings. NON-VASCULAR Hepatobiliary: Liver has normal imaging features on arterial phase exam. Gallbladder surgically absent. No intrahepatic or extrahepatic biliary dilation. Pancreas: No focal mass lesion. No dilatation of the main duct. No intraparenchymal cyst. No peripancreatic edema. Spleen: No splenomegaly. No focal mass lesion. Adrenals/Urinary Tract: Mild left adrenal thickening without nodule or mass. Right adrenal gland unremarkable. No evidence for enhancing right renal lesion. Left kidney appears atrophic without discernible enhancing lesion. No hydronephrosis. Stomach/Bowel: Small hiatal hernia noted. Stomach otherwise unremarkable. Duodenum is normally positioned as is the ligament of Treitz. There appears to be some mild wall edema in the proximal transverse colon although this region is incompletely visualized. Left  lower quadrant ostomy is not visualized on prior study represented date sigmoid end colostomy with parastomal herniation of small bowel loops. Lymphatic: There is no gastrohepatic or hepatoduodenal ligament lymphadenopathy. No intraperitoneal or retroperitoneal lymphadenopathy. Other: No intraperitoneal free fluid. Musculoskeletal: Bone windows reveal no worrisome lytic or sclerotic osseous lesions. IMPRESSION: VASCULAR Unremarkable aside from potential flow limiting plaque at the origin of the left renal artery given associated left renal atrophy. NON-VASCULAR Question mild edema in the wall of the proximal transverse colon, incompletely visualized on this abdomen only exam. Infectious or inflammatory colitis would be a consideration although limited visualization limits assessment. Small hiatal hernia. Electronically Signed   By: Misty Stanley M.D.   On: 08/21/2016 19:32   Ct Head Code Stroke W/o Cm  Result Date: 08/21/2016 CLINICAL DATA:  Code stroke. Left-sided weakness. Colon cancer and cervical cancer. EXAM: CT HEAD WITHOUT CONTRAST TECHNIQUE: Contiguous axial images were obtained from the base of the skull through the vertex without intravenous contrast. COMPARISON:  CT head 12/25/2008 FINDINGS: Brain: No evidence of acute infarction, hemorrhage, hydrocephalus, extra-axial collection or mass lesion/mass effect. Vascular: No hyperdense vessel or unexpected calcification. Skull: Negative Sinuses/Orbits: Negative Other: None ASPECTS ([place] Stroke Program Early CT Score) - Ganglionic level infarction (caudate, lentiform nuclei, internal capsule, insula, M1-M3 cortex): 7 - Supraganglionic infarction (M4-M6 cortex): 3 Total score (0-10 with 10 being normal): 10 IMPRESSION: 1. Negative CT head 2. ASPECTS is 10 3. These results were called by telephone at the time of interpretation on 08/21/2016 at 6:24 pm to Dr. Lenise Arena , who verbally acknowledged these results. Electronically Signed   By: Franchot Gallo M.D.   On: 08/21/2016 18:25    EKG:  normal sinus rhythm at 81 bpm, normal axis and nonspecific  ST and T wave changes. PVCs.  IMPRESSION AND PLAN:  This is a 70 y.o. female with a history of Hypertension, CVA x2, remote colon cancer status post colectomy with colostomy  now being admitted with:  1. TIA rule out CVA -  - Admit telemetry observation for neuro workup including: - Studies: MRA/MRI, Echo, Carotids - Labs: CBC, BMP, Lipids, TFTs, A1C - Nursing: Neurochecks, O2, dysphagia screen, permissive hypertension.  - Consults: Neurology, PT/OT, S/S consults.  - Meds: Daily aspirin 81mg .   - Fluids: IVNS@75cc /hr.   - Routine DVT Px: with Lovenox, SCDs, early ambulation  2. Left lower quadrant abdominal pain with CT concerning for colitis. Patient received Cipro and Flagyl in the emergency department which we'll continue. Pending stool studies.  3.  Hypokalemia, chronic. Patient states that she is on a medication that contains potassium which I do not see on her home med list. We will replace her potassium IV and by mouth, monitor her on telemetry and recheck BMP in a.m.  4. History of hypertension-continue Norvasc, Coreg, HCTZ, Cozaar 5. History of GERD-continue Prilosec 6. History of neuropathic pain-continue gabapentin.  Code Status: Full  All the records are reviewed and case discussed with ED provider. Management plans discussed with the patient and/or family who express understanding and agree with plan of care.  TOTAL TIME TAKING CARE OF THIS PATIENT: 60 minutes.   Akiba Melfi D.O. on 08/22/2016 at 12:29 AM Between 7am to 6pm - Pager - [phone] After 6pm go to [email or web address] - [employer]  [facility] Office [phone] CC: Primary care physician; Letta Median, MD     Note: This dictation was prepared with Dragon dictation along with smaller phrase technology. Any transcriptional errors that result from this  process are unintentional.

## 2016-08-22 NOTE — Plan of Care (Signed)
Problem: Education: Goal: Knowledge of disease or condition will improve Outcome: Progressing Pt had Echo, MRI and Ultrasound of Carotids performed this shift; NIH = 0; no complaints this shift to date

## 2016-08-22 NOTE — Progress Notes (Signed)
[facility] NAME: Kendra Ritter    MR#:  [id number]  DATE OF BIRTH:  1946-05-21  SUBJECTIVE:  CHIEF COMPLAINT:  Pt is feeling better, LUE weakness is improving and speech is better, no abd pain  REVIEW OF SYSTEMS:  CONSTITUTIONAL: No fever, fatigue or weakness.  EYES: No blurred or double vision.  EARS, NOSE, AND THROAT: No tinnitus or ear pain.  RESPIRATORY: No cough, shortness of breath, wheezing or hemoptysis.  CARDIOVASCULAR: No chest pain, orthopnea, edema.  GASTROINTESTINAL: No nausea, vomiting, diarrhea or abdominal pain.  GENITOURINARY: No dysuria, hematuria.  ENDOCRINE: No polyuria, nocturia,  HEMATOLOGY: No anemia, easy bruising or bleeding SKIN: No rash or lesion. MUSCULOSKELETAL:LUE weakness improving No joint pain or arthritis.   NEUROLOGIC: No tingling, numbness, weakness.  PSYCHIATRY: No anxiety or depression.   DRUG ALLERGIES:   Allergies  Allergen Reactions  . Lisinopril     Other reaction(s): Other (See Comments) Other Reaction: bad cough    VITALS:  Blood pressure (!) 139/59, pulse 66, temperature 98.1 F (36.7 C), temperature source Oral, resp. rate 18, height 5\' 4"  (1.626 m), weight 80.3 kg (177 lb 1.6 oz), SpO2 100 %.  PHYSICAL EXAMINATION:  GENERAL:  70 y.o.-year-old patient lying in the bed with no acute distress.  EYES: Pupils equal, round, reactive to light and accommodation. No scleral icterus. Extraocular muscles intact.  HEENT: Head atraumatic, normocephalic. Oropharynx and nasopharynx clear.  NECK:  Supple, no jugular venous distention. No thyroid enlargement, no tenderness.  LUNGS: Normal breath sounds bilaterally, no wheezing, rales,rhonchi or crepitation. No use of accessory muscles of respiration.  CARDIOVASCULAR: S1, S2 normal. No murmurs, rubs, or gallops.  ABDOMEN: Soft, nontender, nondistended. Bowel sounds present. No organomegaly or mass.  EXTREMITIES: No pedal edema,  cyanosis, or clubbing.  NEUROLOGIC: Cranial nerves II through XII are intact. Muscle strength 5/5 in all extremities. Sensation intact. Gait not checked. Speech clear PSYCHIATRIC: The patient is alert and oriented x 3.  SKIN: No obvious rash, lesion, or ulcer.    LABORATORY PANEL:   CBC  Recent Labs Lab 08/21/16 1823  WBC 13.8*  HGB 11.7*  HCT 36.8  PLT 479*   ------------------------------------------------------------------------------------------------------------------  Chemistries   Recent Labs Lab 08/21/16 1823 08/22/16 1131  NA 139  --   K 2.7*  --   CL 99*  --   CO2 29  --   GLUCOSE 153*  --   BUN 21*  --   CREATININE 1.17*  --   CALCIUM 9.8  --   MG 1.8 1.7  AST 188*  --   ALT 45  --   ALKPHOS 125  --   BILITOT 0.6  --    ------------------------------------------------------------------------------------------------------------------  Cardiac Enzymes  Recent Labs Lab 08/22/16 1647  TROPONINI <0.03   ------------------------------------------------------------------------------------------------------------------  RADIOLOGY:  Dg Chest 2 View  Result Date: 08/21/2016 CLINICAL DATA:  Sudden onset lower abdominal pain radiating to the back. EXAM: CHEST  2 VIEW COMPARISON:  01/14/2015 FINDINGS: The cardio pericardial silhouette is enlarged. Status post cardiac valve replacement. No pulmonary edema or focal airspace consolidation. No evidence for pleural effusion. Cerclage wires overlie the right chest. The visualized bony structures of the thorax are intact. Telemetry leads overlie the chest. IMPRESSION: Stable exam.  Cardiomegaly without acute cardiopulmonary findings. Electronically Signed   By: Misty Stanley M.D.   On: 08/21/2016 23:58   Ct Angio Abdomen W And/or Wo Contrast  Result Date: 08/21/2016 CLINICAL DATA:  Acute onset of abdominal pain and left-sided weakness today at 4:30 p.m. History of cervical and colon cancer. EXAM: CT ANGIOGRAPHY  ABDOMEN TECHNIQUE: Multidetector CT imaging of the abdomen was performed using the standard protocol during bolus administration of intravenous contrast. Multiplanar reconstructed images and MIPs were obtained and reviewed to evaluate the vascular anatomy. CONTRAST:  75 cc Isovue 370 COMPARISON:  CT scan 08/13/2011 FINDINGS: VASCULAR Aorta: No aneurysm. No dissection. Minimal atherosclerotic calcification. No stenosis. Celiac: Widely patent.  Normal branch vessel anatomy. SMA: Widely patent. Renals: Single widely patent right renal artery noted. Calcific plaque noted at the origin of the left renal artery which does opacify. Left kidney appears atrophic raising the question of flow limiting left renal artery stenosis. IMA: Widely patent. Veins: Portal venous and mesenteric venous anatomy not well demonstrated given bolus timing. IVC is non-opacified. Review of the MIP images confirms the above findings. NON-VASCULAR Hepatobiliary: Liver has normal imaging features on arterial phase exam. Gallbladder surgically absent. No intrahepatic or extrahepatic biliary dilation. Pancreas: No focal mass lesion. No dilatation of the main duct. No intraparenchymal cyst. No peripancreatic edema. Spleen: No splenomegaly. No focal mass lesion. Adrenals/Urinary Tract: Mild left adrenal thickening without nodule or mass. Right adrenal gland unremarkable. No evidence for enhancing right renal lesion. Left kidney appears atrophic without discernible enhancing lesion. No hydronephrosis. Stomach/Bowel: Small hiatal hernia noted. Stomach otherwise unremarkable. Duodenum is normally positioned as is the ligament of Treitz. There appears to be some mild wall edema in the proximal transverse colon although this region is incompletely visualized. Left lower quadrant ostomy is not visualized on prior study represented date sigmoid end colostomy with parastomal herniation of small bowel loops. Lymphatic: There is no gastrohepatic or  hepatoduodenal ligament lymphadenopathy. No intraperitoneal or retroperitoneal lymphadenopathy. Other: No intraperitoneal free fluid. Musculoskeletal: Bone windows reveal no worrisome lytic or sclerotic osseous lesions. IMPRESSION: VASCULAR Unremarkable aside from potential flow limiting plaque at the origin of the left renal artery given associated left renal atrophy. NON-VASCULAR Question mild edema in the wall of the proximal transverse colon, incompletely visualized on this abdomen only exam. Infectious or inflammatory colitis would be a consideration although limited visualization limits assessment. Small hiatal hernia. Electronically Signed   By: Misty Stanley M.D.   On: 08/21/2016 19:32   Mr Brain Wo Contrast  Result Date: 08/22/2016 CLINICAL DATA:  History of hypertension and stroke. Altered mental status with speech disturbance. EXAM: MRI HEAD WITHOUT CONTRAST MRA HEAD WITHOUT CONTRAST TECHNIQUE: Multiplanar, multiecho pulse sequences of the brain and surrounding structures were obtained without intravenous contrast. Angiographic images of the head were obtained using MRA technique without contrast. COMPARISON:  Head CT 08/21/2016 FINDINGS: MRI HEAD FINDINGS Brain: Diffusion imaging does not show any acute or subacute infarction. The brainstem and cerebellum are normal. Cerebral hemispheres show moderate chronic small-vessel ischemic changes affecting the deep and subcortical white matter. No cortical or large vessel territory infarction. No mass lesion, hemorrhage, hydrocephalus or extra-axial collection. Vascular: Major vessels at the base of the brain show flow. Skull and upper cervical spine: Negative Sinuses/Orbits: Clear/ normal Other: None significant MRA HEAD FINDINGS Both internal carotid arteries are widely patent through the skullbase and siphon regions. The anterior and middle cerebral vessels are patent without proximal stenosis, aneurysm or vascular malformation. Both vertebral arteries  are widely patent to the basilar. No basilar stenosis. Posterior circulation branch vessels appear normal. IMPRESSION: No acute finding. Moderate chronic small-vessel ischemic changes of the cerebral hemispheric white matter. Normal intracranial MR angiography of  the large and medium size vessels. Electronically Signed   By: Nelson Chimes M.D.   On: 08/22/2016 11:43   US Carotid Bilateral (at [facility] Only)  Result Date: 08/22/2016 CLINICAL DATA:  Left-sided weakness, TIA, hypertension and history of coronary artery disease. EXAM: BILATERAL CAROTID DUPLEX ULTRASOUND TECHNIQUE: Pearline Cables scale imaging, color Doppler and duplex ultrasound were performed of bilateral carotid and vertebral arteries in the neck. COMPARISON:  None. FINDINGS: Criteria: Quantification of carotid stenosis is based on velocity parameters that correlate the residual internal carotid diameter with NASCET-based stenosis levels, using the diameter of the distal internal carotid lumen as the denominator for stenosis measurement. The following velocity measurements were obtained: RIGHT ICA:  71/15 cm/sec CCA:  [id number] cm/sec SYSTOLIC ICA/CCA RATIO:  0.9 DIASTOLIC ICA/CCA RATIO:  1.0 ECA:  70 cm/sec LEFT ICA:  78/15 cm/sec CCA:  [id number] cm/sec SYSTOLIC ICA/CCA RATIO:  0.9 DIASTOLIC ICA/CCA RATIO:  0.9 ECA:  89 cm/sec RIGHT CAROTID ARTERY: Minimal partially calcified plaque at the level of the carotid bulb. No evidence of ICA plaque or stenosis. RIGHT VERTEBRAL ARTERY: Antegrade flow with normal waveform and velocity. LEFT CAROTID ARTERY: No focal plaque identified. No evidence of left-sided carotid stenosis. LEFT VERTEBRAL ARTERY: Antegrade flow with normal waveform and velocity. IMPRESSION: Minimal plaque in the right carotid bulb. No evidence of bilateral ICA plaque or stenosis. Electronically Signed   By: Aletta Edouard M.D.   On: 08/22/2016 11:10   Mr Jodene Nam Head/brain [id number] Cm  Result Date: 08/22/2016 CLINICAL DATA:  History of hypertension and  stroke. Altered mental status with speech disturbance. EXAM: MRI HEAD WITHOUT CONTRAST MRA HEAD WITHOUT CONTRAST TECHNIQUE: Multiplanar, multiecho pulse sequences of the brain and surrounding structures were obtained without intravenous contrast. Angiographic images of the head were obtained using MRA technique without contrast. COMPARISON:  Head CT 08/21/2016 FINDINGS: MRI HEAD FINDINGS Brain: Diffusion imaging does not show any acute or subacute infarction. The brainstem and cerebellum are normal. Cerebral hemispheres show moderate chronic small-vessel ischemic changes affecting the deep and subcortical white matter. No cortical or large vessel territory infarction. No mass lesion, hemorrhage, hydrocephalus or extra-axial collection. Vascular: Major vessels at the base of the brain show flow. Skull and upper cervical spine: Negative Sinuses/Orbits: Clear/ normal Other: None significant MRA HEAD FINDINGS Both internal carotid arteries are widely patent through the skullbase and siphon regions. The anterior and middle cerebral vessels are patent without proximal stenosis, aneurysm or vascular malformation. Both vertebral arteries are widely patent to the basilar. No basilar stenosis. Posterior circulation branch vessels appear normal. IMPRESSION: No acute finding. Moderate chronic small-vessel ischemic changes of the cerebral hemispheric white matter. Normal intracranial MR angiography of the large and medium size vessels. Electronically Signed   By: Nelson Chimes M.D.   On: 08/22/2016 11:43   Ct Head Code Stroke W/o Cm  Result Date: 08/21/2016 CLINICAL DATA:  Code stroke. Left-sided weakness. Colon cancer and cervical cancer. EXAM: CT HEAD WITHOUT CONTRAST TECHNIQUE: Contiguous axial images were obtained from the base of the skull through the vertex without intravenous contrast. COMPARISON:  CT head 12/25/2008 FINDINGS: Brain: No evidence of acute infarction, hemorrhage, hydrocephalus, extra-axial collection or  mass lesion/mass effect. Vascular: No hyperdense vessel or unexpected calcification. Skull: Negative Sinuses/Orbits: Negative Other: None ASPECTS ([place] Stroke Program Early CT Score) - Ganglionic level infarction (caudate, lentiform nuclei, internal capsule, insula, M1-M3 cortex): 7 - Supraganglionic infarction (M4-M6 cortex): 3 Total score (0-10 with 10 being normal): 10 IMPRESSION: 1. Negative CT head 2.  ASPECTS is 10 3. These results were called by telephone at the time of interpretation on 08/21/2016 at 6:24 pm to Dr. Lenise Arena , who verbally acknowledged these results. Electronically Signed   By: Franchot Gallo M.D.   On: 08/21/2016 18:25    EKG:   Orders placed or performed during the hospital encounter of 08/21/16  . ED EKG  . ED EKG  . EKG 12-Lead  . EKG 12-Lead    ASSESSMENT AND PLAN:   This is a 70 y.o. female with a history of Hypertension, CVA x2, remote colon cancer status post colectomy with colostomy  # Acute on ch hypokalemia Replete po and iv potasium Recheck pot  Mg- 1.7  .# TIA  ruled out CVA   Symptoms resolved - Studies: MRA/MRI, Carotids - neg -Echo - 55-60 % ef. No cardiac emboli  Lipids - LDL -39 - Nursing: passed dysphagia screen .  -appreciate: Neurology consult  PT/OT- no needs identified - Meds: Daily aspirin 81mg .    - Routine DVT Px: with Lovenox, SCDs, early ambulation  #. Left lower quadrant abdominal pain with CT concerning for colitis.  Clinically improved Patient on Cipro and Flagyl   Pending stool studies.   #. History of hypertension-continue Norvasc, Coreg,  HCTZ, Cozaar  #. History of GERD-continue Prilosec # History of neuropathic pain-continue gabapentin.  # abnml u/s - cx pending , on cipro  DVT px    All the records are reviewed and case discussed with Care Management/Social Workerr. Management plans discussed with the patient, family and they are in agreement.  CODE STATUS: fc  TOTAL TIME TAKING CARE OF  THIS PATIENT: 36 minutes.   POSSIBLE D/C IN 1-2 DAYS, DEPENDING ON CLINICAL CONDITION.  Note: This dictation was prepared with Dragon dictation along with smaller phrase technology. Any transcriptional errors that result from this process are unintentional.   Nicholes Mango M.D on 08/22/2016 at 7:21 PM  Between 7am to 6pm - Pager - [phone] After 6pm go to [email or web address] - password [email or web address] [facility]  Office  [phone]  CC: Primary care physician; Letta Median, [place]

## 2016-08-22 NOTE — Evaluation (Signed)
Physical Therapy Evaluation Patient Details Name: Kendra Ritter MRN: [id number] DOB: 1945/11/26 Today's Date: 08/22/2016   History of Present Illness  Kendra Ritter is a 70 y.o. female with a known history of Hypertension, CVA, remote colon cancer status post colectomy with colostomy presents to the emergency department for evaluation of Altered mental status and left lower quadrant abdominal pain. Patient's MRI and MRA were negative for acute findings. She reports feeling better at time of evaluation.   Clinical Impression  70 yo Female came to ED with left lower quadrant pain and AMS. Patient reports being independent in all ADLs prior to admittance. She walked without AD. Patient currently is independent in bed mobility and transfers. She ambulated mod I with IV pole x175 feet with good reciprocal gait pattern. Patient reports no change from baseline. She lives with her husband who is available to help at home as needed. Patient does not need any additional skilled PT intervention at this time.     Follow Up Recommendations      Equipment Recommendations  None recommended by PT    Recommendations for Other Services       Precautions / Restrictions Restrictions Weight Bearing Restrictions: No      Mobility  Bed Mobility Overal bed mobility: Independent                Transfers Overall transfer level: Independent               General transfer comment: no assistive device for sit<>Stand transfer; demonstrates good safety awareness.  Ambulation/Gait Ambulation/Gait assistance: Independent Ambulation Distance (Feet): 175 Feet Assistive device:  (IV pole) Gait Pattern/deviations: Step-through pattern;WFL(Within Functional Limits)     General Gait Details: demonstrates good foot clearance and good gait speed; no unsteadiness noted.  Stairs            Wheelchair Mobility    Modified Rankin (Stroke Patients Only)       Balance Overall balance  assessment: Independent;No apparent balance deficits (not formally assessed)                                           Pertinent Vitals/Pain Pain Assessment: No/denies pain    Home Living Family/patient expects to be discharged to:: Private residence Living Arrangements: Spouse/significant other Available Help at Discharge: Family Type of Home: House Home Access: Stairs to enter Entrance Stairs-Rails: Right;Left;Can reach both Technical brewer of Steps: 5 Home Layout: One level Home Equipment: None      Prior Function Level of Independence: Independent         Comments: did meals and cleaned home;      Hand Dominance   Dominant Hand: Right    Extremity/Trunk Assessment   Upper Extremity Assessment: Overall WFL for tasks assessed           Lower Extremity Assessment: Overall WFL for tasks assessed (grossly 4/5 strength)      Cervical / Trunk Assessment: Normal  Communication   Communication: No difficulties  Cognition Arousal/Alertness: Awake/alert Behavior During Therapy: WFL for tasks assessed/performed Overall Cognitive Status: Within Functional Limits for tasks assessed                      General Comments      Exercises     Assessment/Plan    PT Assessment Patent does not need any further PT services  PT Problem List            PT Treatment Interventions      PT Goals (Current goals can be found in the Care Plan section)  Acute Rehab PT Goals Patient Stated Goal: "I want to go home." PT Goal Formulation: With patient Time For Goal Achievement: 08/22/16 Potential to Achieve Goals: Good    Frequency     Barriers to discharge        Co-evaluation               End of Session Equipment Utilized During Treatment: Gait belt Activity Tolerance: Patient tolerated treatment well Patient left: in bed;with family/visitor present Nurse Communication: Mobility status         Time: [id number] PT  Time Calculation (min) (ACUTE ONLY): 10 min   Charges:   PT Evaluation $PT Eval Low Complexity: 1 Procedure     PT G Codes:        Media Pizzini PT, DPT 08/22/2016, 1:37 PM

## 2016-08-22 NOTE — Care Management Important Message (Signed)
Important Message  Patient Details  Name: REDIET ZITTEL MRN: [id number] Date of Birth: 1946/05/02   Medicare Important Message Given:  Yes    Arav Bannister A, RN 08/22/2016, 2:58 PM

## 2016-08-23 DIAGNOSIS — G459 Transient cerebral ischemic attack, unspecified: Secondary | ICD-10-CM | POA: Diagnosis not present

## 2016-08-23 DIAGNOSIS — R531 Weakness: Secondary | ICD-10-CM | POA: Diagnosis not present

## 2016-08-23 LAB — CBC WITH DIFFERENTIAL/PLATELET
BASOS ABS: 0.1 10*3/uL (ref 0–0.1)
BASOS PCT: 1 %
Eosinophils Absolute: 0.2 10*3/uL (ref 0–0.7)
Eosinophils Relative: 3 %
HEMATOCRIT: 30 % — AB (ref 35.0–47.0)
HEMOGLOBIN: 9.5 g/dL — AB (ref 12.0–16.0)
LYMPHS PCT: 11 %
Lymphs Abs: 0.8 10*3/uL — ABNORMAL LOW (ref 1.0–3.6)
MCH: 26 pg (ref 26.0–34.0)
MCHC: 31.8 g/dL — ABNORMAL LOW (ref 32.0–36.0)
MCV: 81.9 fL (ref 80.0–100.0)
MONO ABS: 0.5 10*3/uL (ref 0.2–0.9)
Monocytes Relative: 8 %
NEUTROS ABS: 5.2 10*3/uL (ref 1.4–6.5)
NEUTROS PCT: 77 %
Platelets: 357 10*3/uL (ref 150–440)
RBC: 3.67 MIL/uL — AB (ref 3.80–5.20)
RDW: 14.8 % — AB (ref 11.5–14.5)
WBC: 6.8 10*3/uL (ref 3.6–11.0)

## 2016-08-23 LAB — BASIC METABOLIC PANEL
ANION GAP: 6 (ref 5–15)
BUN: 12 mg/dL (ref 6–20)
CALCIUM: 8.8 mg/dL — AB (ref 8.9–10.3)
CO2: 24 mmol/L (ref 22–32)
Chloride: 107 mmol/L (ref 101–111)
Creatinine, Ser: 0.93 mg/dL (ref 0.44–1.00)
GLUCOSE: 114 mg/dL — AB (ref 65–99)
POTASSIUM: 3.8 mmol/L (ref 3.5–5.1)
Sodium: 137 mmol/L (ref 135–145)

## 2016-08-23 LAB — URINE CULTURE

## 2016-08-23 LAB — TROPONIN I: Troponin I: <0.03 ng/mL (ref <0.03)

## 2016-08-23 LAB — HEMOGLOBIN A1C
HEMOGLOBIN A1C: 6.3 % — AB (ref 4.8–5.6)
Mean Plasma Glucose: 134 mg/dL

## 2016-08-23 MED ORDER — ASPIRIN 81 MG PO TBEC: 81.0000 mg | DELAYED_RELEASE_TABLET | Freq: Every day | ORAL | Status: AC

## 2016-08-23 MED ORDER — CIPROFLOXACIN HCL 250 MG PO TABS: 250.0000 mg | ORAL_TABLET | Freq: Two times a day (BID) | ORAL | 0 refills | Status: DC

## 2016-08-23 MED ORDER — METRONIDAZOLE 500 MG PO TABS: 500.0000 mg | ORAL_TABLET | Freq: Three times a day (TID) | ORAL | 0 refills | Status: DC

## 2016-08-23 MED ORDER — METRONIDAZOLE 500 MG PO TABS: 500.0000 mg | ORAL_TABLET | Freq: Three times a day (TID) | ORAL | 0 refills | Status: AC

## 2016-08-23 NOTE — Discharge Instructions (Signed)
Activity as tolerated Cardiac diet Follow-up with primary care physician in a week

## 2016-08-23 NOTE — Consult Note (Addendum)
Referring Physician: Gouru    Chief Complaint: Difficulty with speech  HPI: Kendra Ritter is an 70 y.o. female with a history of stroke who reports that on Saturday she had the acute onset of abdominal pain with standing then noted difficulty getting her words out.  Episode of difficulty getting her words out lasted for about 5 minutes then resolved spontaneously.  Patient presented for evaluation at that time.  Initial NIHSS of 1.    Date last known well: Date: 08/21/2016 Time last known well: Time: 16:30 tPA Given: No: Resolution of symptoms  Past Medical History:  Diagnosis Date  . Anemia   . BPPV (benign paroxysmal positional vertigo)   . Colon cancer ([place]) 1998  . GERD (gastroesophageal reflux disease)   . H/O aortic valve replacement   . Heart murmur   . Hx of cervical cancer   . Hypertension   . Stroke  Hospital)    multiple strokes, 2010, 2011, 2013     Past Surgical History:  Procedure Laterality Date  . AORTIC VALVE REPLACEMENT  2008   [facility]  . APPENDECTOMY    . CARDIAC CATHETERIZATION  2008   [facility]  . COLOSTOMY  1998   After colon cancer   . partial hysterectomy     cervical cancer    Family History  Problem Relation Age of Onset  . Heart disease Mother   . Heart attack Mother   . Hypertension Mother   . Breast cancer Maternal Aunt    Social History:  reports that she has never smoked. She has never used smokeless tobacco. She reports that she does not drink alcohol or use drugs.  Allergies:  Allergies  Allergen Reactions  . Lisinopril     Other reaction(s): Other (See Comments) Other Reaction: bad cough    Medications:  I have reviewed the patient's current medications. Prior to Admission:  Prescriptions Prior to Admission  Medication Sig Dispense Refill Last Dose  . amLODipine (NORVASC) 10 MG tablet Take 1 tablet (10 mg total) by mouth daily. 30 tablet 6 08/21/2016 at 0700  . carvedilol (COREG) 25 MG tablet Take 25 mg by mouth 2 (two) times daily with  a meal.   08/21/2016 at 0700  . ferrous sulfate 325 (65 FE) MG tablet Take 325 mg by mouth daily with breakfast.   08/21/2016 at 0700  . gabapentin (NEURONTIN) 300 MG capsule Take 300 mg by mouth 3 (three) times daily.    08/21/2016 at 0700  . hydrochlorothiazide (HYDRODIURIL) 25 MG tablet Take 25 mg by mouth daily.   08/21/2016 at 0700  . losartan (COZAAR) 100 MG tablet Take 100 mg by mouth daily.   08/21/2016 at 0700  . omeprazole (PRILOSEC) 20 MG capsule Take 20 mg by mouth daily.   08/21/2016 at 0700   Scheduled: . amLODipine  10 mg Oral Daily  . aspirin EC  81 mg Oral Daily  . carvedilol  25 mg Oral BID WC  . ciprofloxacin  400 mg Intravenous BID  . enoxaparin (LOVENOX) injection  40 mg Subcutaneous Q24H  . ferrous sulfate  325 mg Oral Q breakfast  . gabapentin  300 mg Oral TID  . hydrochlorothiazide  25 mg Oral Daily  . losartan  100 mg Oral Daily  . metronidazole  500 mg Intravenous Q8H  . pantoprazole  40 mg Oral QAC breakfast    ROS: History obtained from the patient  General ROS: negative for - chills, fatigue, fever, night sweats, weight gain or weight  loss Psychological ROS: negative for - behavioral disorder, hallucinations, memory difficulties, mood swings or suicidal ideation Ophthalmic ROS: negative for - blurry vision, double vision, eye pain or loss of vision ENT ROS: negative for - epistaxis, nasal discharge, oral lesions, sore throat, tinnitus or vertigo Allergy and Immunology ROS: negative for - hives or itchy/watery eyes Hematological and Lymphatic ROS: negative for - bleeding problems, bruising or swollen lymph nodes Endocrine ROS: negative for - galactorrhea, hair pattern changes, polydipsia/polyuria or temperature intolerance Respiratory ROS: negative for - cough, hemoptysis, shortness of breath or wheezing Cardiovascular ROS: negative for - chest pain, dyspnea on exertion, edema or irregular heartbeat Gastrointestinal ROS: as noted in HPI Genito-Urinary  ROS: negative for - dysuria, hematuria, incontinence or urinary frequency/urgency Musculoskeletal ROS: negative for - joint swelling or muscular weakness Neurological ROS: as noted in HPI Dermatological ROS: negative for rash and skin lesion changes  Physical Examination: Blood pressure 128/62, pulse 66, temperature 97.8 F (36.6 C), temperature source Oral, resp. rate 18, height 5\' 4"  (1.626 m), weight 80.3 kg (177 lb 1.6 oz), SpO2 96 %.   HEENT-  Normocephalic, no lesions, without obvious abnormality.  Normal external eye and conjunctiva.  Normal TM's bilaterally.  Normal auditory canals and external ears. Normal external nose, mucus membranes and septum.  Normal pharynx. Cardiovascular- S1, S2 normal, pulses palpable throughout   Lungs- chest clear, no wheezing, rales, normal symmetric air entry Abdomen- soft, non-tender; bowel sounds normal; no masses,  no organomegaly Extremities- no edema Lymph-no adenopathy palpable Musculoskeletal-no joint tenderness, deformity or swelling Skin-warm and dry, no hyperpigmentation, vitiligo, or suspicious lesions  Neurological Examination Mental Status: Alert, oriented, thought content appropriate.  Speech fluent without evidence of aphasia.  Able to follow 3 step commands without difficulty. Cranial Nerves: II: Discs flat bilaterally; Visual fields grossly normal, pupils equal, round, reactive to light and accommodation III,IV, VI: ptosis not present, extra-ocular motions intact bilaterally with right esotropia V,VII: smile symmetric, facial light touch sensation normal bilaterally VIII: hearing normal bilaterally IX,X: gag reflex present XI: bilateral shoulder shrug XII: midline tongue extension Motor: Right : Upper extremity   5/5    Left:     Upper extremity   5/5  Lower extremity   5/5     Lower extremity   5/5 Tone and bulk:normal tone throughout; no atrophy noted Sensory: Pinprick and light touch intact throughout, bilaterally Deep  Tendon Reflexes: 2+ and symmetric throughout Plantars: Right: downgoing   Left: downgoing Cerebellar: normal finger-to-nose, normal rapid alternating movements and normal heel-to-shin test Gait: normal gait and station    Laboratory Studies:  Basic Metabolic Panel:  Recent Labs Lab 08/21/16 1823 08/22/16 1131 08/22/16 2318 08/23/16 0447  NA 139  --   --  137  K 2.7*  --  3.1* 3.8  CL 99*  --   --  107  CO2 29  --   --  24  GLUCOSE 153*  --   --  114*  BUN 21*  --   --  12  CREATININE 1.17*  --   --  0.93  CALCIUM 9.8  --   --  8.8*  MG 1.8 1.7  --   --     Liver Function Tests:  Recent Labs Lab 08/21/16 1823  AST 188*  ALT 45  ALKPHOS 125  BILITOT 0.6  PROT 8.4*  ALBUMIN 4.3    Recent Labs Lab 08/21/16 1823  LIPASE 27   No results for input(s): AMMONIA in the last 168  hours.  CBC:  Recent Labs Lab 08/21/16 1823 08/23/16 1046  WBC 13.8* 6.8  NEUTROABS 11.8* 5.2  HGB 11.7* 9.5*  HCT 36.8 30.0*  MCV 82.0 81.9  PLT 479* 357    Cardiac Enzymes:  Recent Labs Lab 08/22/16 1131 08/22/16 1647 08/22/16 2318 08/23/16 0447 08/23/16 1046  TROPONINI <0.03 <0.03 <0.03 <0.03 <0.03    BNP: Invalid input(s): POCBNP  CBG:  Recent Labs Lab 08/21/16 2305  GLUCAP 119*    Microbiology: Results for orders placed or performed during the hospital encounter of 08/21/16  Urine culture     Status: Abnormal   Collection Time: 08/22/16  1:00 AM  Result Value Ref Range Status   Specimen Description URINE, CLEAN CATCH  Final   Special Requests NONE  Final   Culture MULTIPLE SPECIES PRESENT, SUGGEST RECOLLECTION (A)  Final   Report Status 08/23/2016 FINAL  Final    Coagulation Studies:  Recent Labs  08/21/16 1823  LABPROT 12.3  INR 0.92    Urinalysis:  Recent Labs Lab 08/21/16 1950 08/22/16 0100  COLORURINE YELLOW* STRAW*  LABSPEC 1.051* 1.026  PHURINE 5.0 6.0  GLUCOSEU NEGATIVE NEGATIVE  HGBUR 1+* 1+*  BILIRUBINUR NEGATIVE NEGATIVE   KETONESUR NEGATIVE NEGATIVE  PROTEINUR NEGATIVE NEGATIVE  NITRITE POSITIVE* NEGATIVE  LEUKOCYTESUR 1+* TRACE*    Lipid Panel:    Component Value Date/Time   CHOL 119 08/22/2016 0504   TRIG 136 08/22/2016 0504   HDL 53 08/22/2016 0504   CHOLHDL 2.2 08/22/2016 0504   VLDL 27 08/22/2016 0504   LDLCALC 39 08/22/2016 0504    HgbA1C:  Lab Results  Component Value Date   HGBA1C 6.3 (H) 08/22/2016    Urine Drug Screen:     Component Value Date/Time   LABOPIA NONE DETECTED 08/22/2016 0100   COCAINSCRNUR NONE DETECTED 08/22/2016 0100   LABBENZ NONE DETECTED 08/22/2016 0100   AMPHETMU NONE DETECTED 08/22/2016 0100   THCU NONE DETECTED 08/22/2016 0100   LABBARB NONE DETECTED 08/22/2016 0100    Alcohol Level:  Recent Labs Lab 08/21/16 [place] <5    Other results: EKG: sinus rhythm at 81 bpm.  Imaging: Dg Chest 2 View  Result Date: 08/21/2016 CLINICAL DATA:  Sudden onset lower abdominal pain radiating to the back. EXAM: CHEST  2 VIEW COMPARISON:  01/14/2015 FINDINGS: The cardio pericardial silhouette is enlarged. Status post cardiac valve replacement. No pulmonary edema or focal airspace consolidation. No evidence for pleural effusion. Cerclage wires overlie the right chest. The visualized bony structures of the thorax are intact. Telemetry leads overlie the chest. IMPRESSION: Stable exam.  Cardiomegaly without acute cardiopulmonary findings. Electronically Signed   By: Misty Stanley M.D.   On: 08/21/2016 23:58   Ct Angio Abdomen W And/or Wo Contrast  Result Date: 08/21/2016 CLINICAL DATA:  Acute onset of abdominal pain and left-sided weakness today at 4:30 p.m. History of cervical and colon cancer. EXAM: CT ANGIOGRAPHY ABDOMEN TECHNIQUE: Multidetector CT imaging of the abdomen was performed using the standard protocol during bolus administration of intravenous contrast. Multiplanar reconstructed images and MIPs were obtained and reviewed to evaluate the vascular anatomy.  CONTRAST:  75 cc Isovue 370 COMPARISON:  CT scan 08/13/2011 FINDINGS: VASCULAR Aorta: No aneurysm. No dissection. Minimal atherosclerotic calcification. No stenosis. Celiac: Widely patent.  Normal branch vessel anatomy. SMA: Widely patent. Renals: Single widely patent right renal artery noted. Calcific plaque noted at the origin of the left renal artery which does opacify. Left kidney appears atrophic raising the question of  flow limiting left renal artery stenosis. IMA: Widely patent. Veins: Portal venous and mesenteric venous anatomy not well demonstrated given bolus timing. IVC is non-opacified. Review of the MIP images confirms the above findings. NON-VASCULAR Hepatobiliary: Liver has normal imaging features on arterial phase exam. Gallbladder surgically absent. No intrahepatic or extrahepatic biliary dilation. Pancreas: No focal mass lesion. No dilatation of the main duct. No intraparenchymal cyst. No peripancreatic edema. Spleen: No splenomegaly. No focal mass lesion. Adrenals/Urinary Tract: Mild left adrenal thickening without nodule or mass. Right adrenal gland unremarkable. No evidence for enhancing right renal lesion. Left kidney appears atrophic without discernible enhancing lesion. No hydronephrosis. Stomach/Bowel: Small hiatal hernia noted. Stomach otherwise unremarkable. Duodenum is normally positioned as is the ligament of Treitz. There appears to be some mild wall edema in the proximal transverse colon although this region is incompletely visualized. Left lower quadrant ostomy is not visualized on prior study represented date sigmoid end colostomy with parastomal herniation of small bowel loops. Lymphatic: There is no gastrohepatic or hepatoduodenal ligament lymphadenopathy. No intraperitoneal or retroperitoneal lymphadenopathy. Other: No intraperitoneal free fluid. Musculoskeletal: Bone windows reveal no worrisome lytic or sclerotic osseous lesions. IMPRESSION: VASCULAR Unremarkable aside from  potential flow limiting plaque at the origin of the left renal artery given associated left renal atrophy. NON-VASCULAR Question mild edema in the wall of the proximal transverse colon, incompletely visualized on this abdomen only exam. Infectious or inflammatory colitis would be a consideration although limited visualization limits assessment. Small hiatal hernia. Electronically Signed   By: Misty Stanley M.D.   On: 08/21/2016 19:32   Mr Brain Wo Contrast  Result Date: 08/22/2016 CLINICAL DATA:  History of hypertension and stroke. Altered mental status with speech disturbance. EXAM: MRI HEAD WITHOUT CONTRAST MRA HEAD WITHOUT CONTRAST TECHNIQUE: Multiplanar, multiecho pulse sequences of the brain and surrounding structures were obtained without intravenous contrast. Angiographic images of the head were obtained using MRA technique without contrast. COMPARISON:  Head CT 08/21/2016 FINDINGS: MRI HEAD FINDINGS Brain: Diffusion imaging does not show any acute or subacute infarction. The brainstem and cerebellum are normal. Cerebral hemispheres show moderate chronic small-vessel ischemic changes affecting the deep and subcortical white matter. No cortical or large vessel territory infarction. No mass lesion, hemorrhage, hydrocephalus or extra-axial collection. Vascular: Major vessels at the base of the brain show flow. Skull and upper cervical spine: Negative Sinuses/Orbits: Clear/ normal Other: None significant MRA HEAD FINDINGS Both internal carotid arteries are widely patent through the skullbase and siphon regions. The anterior and middle cerebral vessels are patent without proximal stenosis, aneurysm or vascular malformation. Both vertebral arteries are widely patent to the basilar. No basilar stenosis. Posterior circulation branch vessels appear normal. IMPRESSION: No acute finding. Moderate chronic small-vessel ischemic changes of the cerebral hemispheric white matter. Normal intracranial MR angiography of  the large and medium size vessels. Electronically Signed   By: Nelson Chimes M.D.   On: 08/22/2016 11:43   US Carotid Bilateral (at [facility] And [facility] Only)  Result Date: 08/22/2016 CLINICAL DATA:  Left-sided weakness, TIA, hypertension and history of coronary artery disease. EXAM: BILATERAL CAROTID DUPLEX ULTRASOUND TECHNIQUE: Pearline Cables scale imaging, color Doppler and duplex ultrasound were performed of bilateral carotid and vertebral arteries in the neck. COMPARISON:  None. FINDINGS: Criteria: Quantification of carotid stenosis is based on velocity parameters that correlate the residual internal carotid diameter with NASCET-based stenosis levels, using the diameter of the distal internal carotid lumen as the denominator for stenosis measurement. The following velocity measurements were obtained: RIGHT ICA:  71/15 cm/sec CCA:  [id number] cm/sec SYSTOLIC ICA/CCA RATIO:  0.9 DIASTOLIC ICA/CCA RATIO:  1.0 ECA:  70 cm/sec LEFT ICA:  78/15 cm/sec CCA:  [id number] cm/sec SYSTOLIC ICA/CCA RATIO:  0.9 DIASTOLIC ICA/CCA RATIO:  0.9 ECA:  89 cm/sec RIGHT CAROTID ARTERY: Minimal partially calcified plaque at the level of the carotid bulb. No evidence of ICA plaque or stenosis. RIGHT VERTEBRAL ARTERY: Antegrade flow with normal waveform and velocity. LEFT CAROTID ARTERY: No focal plaque identified. No evidence of left-sided carotid stenosis. LEFT VERTEBRAL ARTERY: Antegrade flow with normal waveform and velocity. IMPRESSION: Minimal plaque in the right carotid bulb. No evidence of bilateral ICA plaque or stenosis. Electronically Signed   By: Aletta Edouard M.D.   On: 08/22/2016 11:10   Mr Jodene Nam Head/brain [id number] Cm  Result Date: 08/22/2016 CLINICAL DATA:  History of hypertension and stroke. Altered mental status with speech disturbance. EXAM: MRI HEAD WITHOUT CONTRAST MRA HEAD WITHOUT CONTRAST TECHNIQUE: Multiplanar, multiecho pulse sequences of the brain and surrounding structures were obtained without intravenous contrast. Angiographic  images of the head were obtained using MRA technique without contrast. COMPARISON:  Head CT 08/21/2016 FINDINGS: MRI HEAD FINDINGS Brain: Diffusion imaging does not show any acute or subacute infarction. The brainstem and cerebellum are normal. Cerebral hemispheres show moderate chronic small-vessel ischemic changes affecting the deep and subcortical white matter. No cortical or large vessel territory infarction. No mass lesion, hemorrhage, hydrocephalus or extra-axial collection. Vascular: Major vessels at the base of the brain show flow. Skull and upper cervical spine: Negative Sinuses/Orbits: Clear/ normal Other: None significant MRA HEAD FINDINGS Both internal carotid arteries are widely patent through the skullbase and siphon regions. The anterior and middle cerebral vessels are patent without proximal stenosis, aneurysm or vascular malformation. Both vertebral arteries are widely patent to the basilar. No basilar stenosis. Posterior circulation branch vessels appear normal. IMPRESSION: No acute finding. Moderate chronic small-vessel ischemic changes of the cerebral hemispheric white matter. Normal intracranial MR angiography of the large and medium size vessels. Electronically Signed   By: Nelson Chimes M.D.   On: 08/22/2016 11:43   Ct Head Code Stroke W/o Cm  Result Date: 08/21/2016 CLINICAL DATA:  Code stroke. Left-sided weakness. Colon cancer and cervical cancer. EXAM: CT HEAD WITHOUT CONTRAST TECHNIQUE: Contiguous axial images were obtained from the base of the skull through the vertex without intravenous contrast. COMPARISON:  CT head 12/25/2008 FINDINGS: Brain: No evidence of acute infarction, hemorrhage, hydrocephalus, extra-axial collection or mass lesion/mass effect. Vascular: No hyperdense vessel or unexpected calcification. Skull: Negative Sinuses/Orbits: Negative Other: None ASPECTS ([place] Stroke Program Early CT Score) - Ganglionic level infarction (caudate, lentiform nuclei, internal  capsule, insula, M1-M3 cortex): 7 - Supraganglionic infarction (M4-M6 cortex): 3 Total score (0-10 with 10 being normal): 10 IMPRESSION: 1. Negative CT head 2. ASPECTS is 10 3. These results were called by telephone at the time of interpretation on 08/21/2016 at 6:24 pm to Dr. Lenise Arena , who verbally acknowledged these results. Electronically Signed   By: Franchot Gallo M.D.   On: 08/21/2016 18:25    Assessment: 70 y.o. female with a history of stroke, last 5 years ago, who presented after an episode of difficulty with speech.  Possible TIA.  MRI reviewed and shows no acute changes.  MRA shows no significant large or medium vessel stenosis.  Carotid dopplers show no evidence of hemodynamically significant stenosis.  Echocardiogram shows no cardiac source of emboli with an EF of 55-60%.  A1c 6.3, LDL 39  Patient on  no antiplatelet therapy at home.    Stroke Risk Factors - hypertension  Plan: 1. PT consult, OT consult, Speech consult 2. Prophylactic therapy-Antiplatelet med: Aspirin - dose 325mg  daily 3. Follow up with neurology on an outpatient basis   Alexis Goodell, MD Neurology [phone] 08/23/2016, 12:24 PM

## 2016-08-23 NOTE — Progress Notes (Signed)
MD order received to discharge pt home today; verbally reviewed AVS with pt; no questions voiced at this time; pt discharged via wheelchair by volunteer to the visitor's entrance

## 2016-08-23 NOTE — Discharge Summary (Signed)
[facility] [place] NAME: Kendra Ritter    MR#:  [id number]  DATE OF BIRTH:  Nov 07, 1945  DATE OF ADMISSION:  08/21/2016 ADMITTING PHYSICIAN: Harvie Bridge, DO  DATE OF DISCHARGE: 08/23/16 PRIMARY CARE PHYSICIAN: Letta Median, MD    ADMISSION DIAGNOSIS:  Hypokalemia [E87.6] Colitis [K52.9] Urinary tract infection without hematuria, site unspecified [N39.0] Transient cerebral ischemia, unspecified type [G45.9]  DISCHARGE DIAGNOSIS:  Active Problems:   TIA (transient ischemic attack) UTI Nonspecific colitis per CT abdomen  SECONDARY DIAGNOSIS:   Past Medical History:  Diagnosis Date  . Anemia   . BPPV (benign paroxysmal positional vertigo)   . Colon cancer ([place]) 1998  . GERD (gastroesophageal reflux disease)   . H/O aortic valve replacement   . Heart murmur   . Hx of cervical cancer   . Hypertension   . Stroke [facility] ([place]    multiple strokes, 2010, 2011, 2013     HOSPITAL COURSE:   Kendra Ritter is a 70 y.o. 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 Her speech was clear but she was unable to express what she wanted to say. She reports that the symptoms resolved spontaneously and is asymptomatic at this time. She denied any other associated neurologic symptoms such as headache, dizziness, slurred speech,  weakness, paresthesias, gait disturbance or vision changes.  Regarding her abdominal pain she reports sudden onset of left lower quadrant abdominal pain that radiated around to her back which is now mostly resolved. She denies any associated change in stool, dysuria, pelvic pain, fevers, chills. She reports that her ostomy is functioning.   Otherwise there has  been no change in status. Patient has been taking medication as prescribed and there has been no recent change in medication or diet.  There has been no recent illness, travel or sick contacts.    Patient denies fevers/chills, weakness, dizziness, chest pain, shortness of breath, N/V/C/D, abdominal pain, dysuria/frequency, changes in mental status at the time of admission. 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 No cardiac emboli  Lipids - LDL -39 - Nursing: passed dysphagia screen .  -appreciate Neurology consult  PT/OT- no needs identified - Meds: Daily aspirin 81mg .  - Routine DVT Px: with Lovenox, SCDs, early ambulation  #. Left lower quadrant abdominal pain with CT concerning for colitis.  Clinically improved Patient on Cipro and Flagyl , and discharged home with the same  Pending stool studies, samples were not collected   #. History of hypertension-continue Norvasc, Coreg,  HCTZ, Cozaar  #. History of GERD-continue Prilosec # History of neuropathic pain-continue gabapentin.  # abnml u/s - cx multiple species contaminated , on cipro    DISCHARGE CONDITIONS:   Fair  CONSULTS OBTAINED:  Treatment Team:  Leotis Pain, MD   PROCEDURES None  DRUG ALLERGIES:   Allergies  Allergen Reactions  . Lisinopril     Other reaction(s): Other (See Comments) Other  Reaction: bad cough    DISCHARGE MEDICATIONS:   Current Discharge Medication List    START taking these medications   Details  aspirin EC 81 MG EC tablet Take 1 tablet (81 mg total) by mouth daily.    ciprofloxacin (CIPRO) 250 MG tablet Take 1 tablet (250 mg total) by mouth 2  (two) times daily. Qty: 10 tablet, Refills: 0    metroNIDAZOLE (FLAGYL) 500 MG tablet Take 1 tablet (500 mg total) by mouth 3 (three) times daily. Qty: 15 tablet, Refills: 0      CONTINUE these medications which have NOT CHANGED   Details  amLODipine (NORVASC) 10 MG tablet Take 1 tablet (10 mg total) by mouth daily. Qty: 30 tablet, Refills: 6    carvedilol (COREG) 25 MG tablet Take 25 mg by mouth 2 (two) times daily with a meal.    ferrous sulfate 325 (65 FE) MG tablet Take 325 mg by mouth daily with breakfast.    gabapentin (NEURONTIN) 300 MG capsule Take 300 mg by mouth 3 (three) times daily.     hydrochlorothiazide (HYDRODIURIL) 25 MG tablet Take 25 mg by mouth daily.    losartan (COZAAR) 100 MG tablet Take 100 mg by mouth daily.    omeprazole (PRILOSEC) 20 MG capsule Take 20 mg by mouth daily.         DISCHARGE INSTRUCTIONS:   Activity as tolerated Cardiac diet Follow-up with primary care physician in a week  DIET:  Cardiac diet  DISCHARGE CONDITION:  Fair  ACTIVITY:  Activity as tolerated  OXYGEN:  Home Oxygen: No.   Oxygen Delivery: room air  DISCHARGE LOCATION:  home   If you experience worsening of your admission symptoms, develop shortness of breath, life threatening emergency, suicidal or homicidal thoughts you must seek medical attention immediately by calling 911 or calling your MD immediately  if symptoms less severe.  You Must read complete instructions/literature along with all the possible adverse reactions/side effects for all the Medicines you take and that have been prescribed to you. Take any new Medicines after you have completely understood and accpet all the possible adverse reactions/side effects.   Please note  You were cared for by a hospitalist during your hospital stay. If you have any questions about your discharge medications or the care you received while you were in the hospital after you are discharged, you can call the unit  and asked to speak with the hospitalist on call if the hospitalist that took care of you is not available. Once you are discharged, your primary care physician will handle any further medical issues. Please note that NO REFILLS for any discharge medications will be authorized once you are discharged, as it is imperative that you return to your primary care physician (or establish a relationship with a primary care physician if you do not have one) for your aftercare needs so that they can reassess your need for medications and monitor your lab values.     Today  Chief Complaint  Patient presents with  . Code Stroke   Patient is doing fine. Speech is back to normal. Her left upper extended weakness resolved. Denies any abdominal pain or back pain. No PT needs identified. patient feels comfortable to be discharged  ROS:  CONSTITUTIONAL: Denies fevers, chills. Denies any fatigue, weakness.  EYES: Denies blurry vision, double vision, eye pain. EARS, NOSE, THROAT: Denies tinnitus, ear pain, hearing loss. RESPIRATORY: Denies cough, wheeze, shortness of breath.  CARDIOVASCULAR: Denies chest pain, palpitations, edema.  GASTROINTESTINAL: Denies nausea, vomiting, diarrhea, abdominal pain. Denies bright red blood per rectum. GENITOURINARY: Denies dysuria, hematuria. ENDOCRINE: Denies nocturia or thyroid problems. HEMATOLOGIC AND LYMPHATIC: Denies easy bruising or bleeding. SKIN: Denies rash or lesion. MUSCULOSKELETAL: Denies pain in neck, back, shoulder, knees, hips or arthritic symptoms.  NEUROLOGIC: Denies paralysis, paresthesias.  PSYCHIATRIC: Denies anxiety or depressive symptoms.   VITAL SIGNS:  Blood pressure 128/62, pulse 66, temperature 97.8 F (36.6 C), temperature source Oral, resp. rate 18, height 5\' 4"  (1.626 m), weight 80.3 kg (177 lb 1.6 oz), SpO2 96 %.  I/O:    Intake/Output Summary (Last 24 hours) at 08/23/16 1205 Last data filed at 08/23/16 1002  Gross per 24 hour  Intake              1665 ml  Output                0 ml  Net             1665 ml    PHYSICAL EXAMINATION:  GENERAL:  70 y.o.-year-old patient lying in the bed with no acute distress.  EYES: Pupils equal, round, reactive to light and accommodation. No scleral icterus. Extraocular muscles intact.  HEENT: Head atraumatic, normocephalic. Oropharynx and nasopharynx clear.  NECK:  Supple, no jugular venous distention. No thyroid enlargement, no tenderness.  LUNGS: Normal breath sounds bilaterally, no wheezing, rales,rhonchi or crepitation. No use of accessory muscles of respiration.  CARDIOVASCULAR: S1, S2 normal. No murmurs, rubs, or gallops.  ABDOMEN: Soft, non-tender, non-distended. Bowel sounds present. No organomegaly or mass.  EXTREMITIES: No pedal edema, cyanosis, or clubbing.  NEUROLOGIC: Cranial nerves II through XII are intact. Muscle strength 5/5 in all extremities. Sensation intact. Gait not checked.  PSYCHIATRIC: The patient is alert and oriented x 3.  SKIN: No obvious rash, lesion, or ulcer.   DATA REVIEW:   CBC  Recent Labs Lab 08/23/16 1046  WBC 6.8  HGB 9.5*  HCT 30.0*  PLT 357    Chemistries   Recent Labs Lab 08/21/16 1823 08/22/16 1131  08/23/16 0447  NA 139  --   --  137  K 2.7*  --   < > 3.8  CL 99*  --   --  107  CO2 29  --   --  24  GLUCOSE 153*  --   --  114*  BUN 21*  --   --  12  CREATININE 1.17*  --   --  0.93  CALCIUM 9.8  --   --  8.8*  MG 1.8 1.7  --   --   AST 188*  --   --   --   ALT 45  --   --   --   ALKPHOS 125  --   --   --   BILITOT 0.6  --   --   --   < > = values in this interval not displayed.  Cardiac Enzymes  Recent Labs Lab 08/23/16 1046  TROPONINI <0.03    Microbiology Results  Results for orders placed or performed during the hospital encounter of 08/21/16  Urine culture     Status: Abnormal   Collection Time: 08/22/16  1:00 AM  Result Value Ref Range Status   Specimen Description URINE, CLEAN CATCH  Final   Special  Requests NONE  Final   Culture MULTIPLE SPECIES PRESENT, SUGGEST RECOLLECTION (A)  Final   Report Status 08/23/2016 FINAL  Final    RADIOLOGY:  Dg Chest  2 View  Result Date: 08/21/2016 CLINICAL DATA:  Sudden onset lower abdominal pain radiating to the back. EXAM: CHEST  2 VIEW COMPARISON:  01/14/2015 FINDINGS: The cardio pericardial silhouette is enlarged. Status post cardiac valve replacement. No pulmonary edema or focal airspace consolidation. No evidence for pleural effusion. Cerclage wires overlie the right chest. The visualized bony structures of the thorax are intact. Telemetry leads overlie the chest. IMPRESSION: Stable exam.  Cardiomegaly without acute cardiopulmonary findings. Electronically Signed   By: Misty Stanley M.D.   On: 08/21/2016 23:58   Ct Angio Abdomen W And/or Wo Contrast  Result Date: 08/21/2016 CLINICAL DATA:  Acute onset of abdominal pain and left-sided weakness today at 4:30 p.m. History of cervical and colon cancer. EXAM: CT ANGIOGRAPHY ABDOMEN TECHNIQUE: Multidetector CT imaging of the abdomen was performed using the standard protocol during bolus administration of intravenous contrast. Multiplanar reconstructed images and MIPs were obtained and reviewed to evaluate the vascular anatomy. CONTRAST:  75 cc Isovue 370 COMPARISON:  CT scan 08/13/2011 FINDINGS: VASCULAR Aorta: No aneurysm. No dissection. Minimal atherosclerotic calcification. No stenosis. Celiac: Widely patent.  Normal branch vessel anatomy. SMA: Widely patent. Renals: Single widely patent right renal artery noted. Calcific plaque noted at the origin of the left renal artery which does opacify. Left kidney appears atrophic raising the question of flow limiting left renal artery stenosis. IMA: Widely patent. Veins: Portal venous and mesenteric venous anatomy not well demonstrated given bolus timing. IVC is non-opacified. Review of the MIP images confirms the above findings. NON-VASCULAR Hepatobiliary: Liver has  normal imaging features on arterial phase exam. Gallbladder surgically absent. No intrahepatic or extrahepatic biliary dilation. Pancreas: No focal mass lesion. No dilatation of the main duct. No intraparenchymal cyst. No peripancreatic edema. Spleen: No splenomegaly. No focal mass lesion. Adrenals/Urinary Tract: Mild left adrenal thickening without nodule or mass. Right adrenal gland unremarkable. No evidence for enhancing right renal lesion. Left kidney appears atrophic without discernible enhancing lesion. No hydronephrosis. Stomach/Bowel: Small hiatal hernia noted. Stomach otherwise unremarkable. Duodenum is normally positioned as is the ligament of Treitz. There appears to be some mild wall edema in the proximal transverse colon although this region is incompletely visualized. Left lower quadrant ostomy is not visualized on prior study represented date sigmoid end colostomy with parastomal herniation of small bowel loops. Lymphatic: There is no gastrohepatic or hepatoduodenal ligament lymphadenopathy. No intraperitoneal or retroperitoneal lymphadenopathy. Other: No intraperitoneal free fluid. Musculoskeletal: Bone windows reveal no worrisome lytic or sclerotic osseous lesions. IMPRESSION: VASCULAR Unremarkable aside from potential flow limiting plaque at the origin of the left renal artery given associated left renal atrophy. NON-VASCULAR Question mild edema in the wall of the proximal transverse colon, incompletely visualized on this abdomen only exam. Infectious or inflammatory colitis would be a consideration although limited visualization limits assessment. Small hiatal hernia. Electronically Signed   By: Misty Stanley M.D.   On: 08/21/2016 19:32   Mr Brain Wo Contrast  Result Date: 08/22/2016 CLINICAL DATA:  History of hypertension and stroke. Altered mental status with speech disturbance. EXAM: MRI HEAD WITHOUT CONTRAST MRA HEAD WITHOUT CONTRAST TECHNIQUE: Multiplanar, multiecho pulse sequences of  the brain and surrounding structures were obtained without intravenous contrast. Angiographic images of the head were obtained using MRA technique without contrast. COMPARISON:  Head CT 08/21/2016 FINDINGS: MRI HEAD FINDINGS Brain: Diffusion imaging does not show any acute or subacute infarction. The brainstem and cerebellum are normal. Cerebral hemispheres show moderate chronic small-vessel ischemic changes affecting the deep and subcortical white  matter. No cortical or large vessel territory infarction. No mass lesion, hemorrhage, hydrocephalus or extra-axial collection. Vascular: Major vessels at the base of the brain show flow. Skull and upper cervical spine: Negative Sinuses/Orbits: Clear/ normal Other: None significant MRA HEAD FINDINGS Both internal carotid arteries are widely patent through the skullbase and siphon regions. The anterior and middle cerebral vessels are patent without proximal stenosis, aneurysm or vascular malformation. Both vertebral arteries are widely patent to the basilar. No basilar stenosis. Posterior circulation branch vessels appear normal. IMPRESSION: No acute finding. Moderate chronic small-vessel ischemic changes of the cerebral hemispheric white matter. Normal intracranial MR angiography of the large and medium size vessels. Electronically Signed   By: Nelson Chimes M.D.   On: 08/22/2016 11:43   US Carotid Bilateral (at [facility] Only)  Result Date: 08/22/2016 CLINICAL DATA:  Left-sided weakness, TIA, hypertension and history of coronary artery disease. EXAM: BILATERAL CAROTID DUPLEX ULTRASOUND TECHNIQUE: Pearline Cables scale imaging, color Doppler and duplex ultrasound were performed of bilateral carotid and vertebral arteries in the neck. COMPARISON:  None. FINDINGS: Criteria: Quantification of carotid stenosis is based on velocity parameters that correlate the residual internal carotid diameter with NASCET-based stenosis levels, using the diameter of the distal internal carotid  lumen as the denominator for stenosis measurement. The following velocity measurements were obtained: RIGHT ICA:  71/15 cm/sec CCA:  [id number] cm/sec SYSTOLIC ICA/CCA RATIO:  0.9 DIASTOLIC ICA/CCA RATIO:  1.0 ECA:  70 cm/sec LEFT ICA:  78/15 cm/sec CCA:  [id number] cm/sec SYSTOLIC ICA/CCA RATIO:  0.9 DIASTOLIC ICA/CCA RATIO:  0.9 ECA:  89 cm/sec RIGHT CAROTID ARTERY: Minimal partially calcified plaque at the level of the carotid bulb. No evidence of ICA plaque or stenosis. RIGHT VERTEBRAL ARTERY: Antegrade flow with normal waveform and velocity. LEFT CAROTID ARTERY: No focal plaque identified. No evidence of left-sided carotid stenosis. LEFT VERTEBRAL ARTERY: Antegrade flow with normal waveform and velocity. IMPRESSION: Minimal plaque in the right carotid bulb. No evidence of bilateral ICA plaque or stenosis. Electronically Signed   By: Aletta Edouard M.D.   On: 08/22/2016 11:10   Dg Bone Density  Result Date: 08/19/2016 EXAM: DUAL X-RAY ABSORPTIOMETRY (DXA) FOR BONE MINERAL DENSITY IMPRESSION: Dear Dr. Letta Median, Your patient Kendra Ritter completed a BMD test on 08/18/2016 using the [place] (analysis version: 14.10) manufactured by [employer]. The following summarizes the results of our evaluation. PATIENT BIOGRAPHICAL: Name: Kendra Ritter, Kendra Ritter Patient ID: [id number] Birth Date: 15-Nov-1945 Height: 62.0 in. Gender: Female Exam Date: 08/18/2016 Weight: 167.0 lbs. Indications: Cervical CA, Postmenopausal, Recurrent Falls Fractures: Treatments: Multi-Vitamin with calcium ASSESSMENT: The BMD measured at Femur Neck Left is 0.961 g/cm2 with a T-score of -0.6. This patient is considered normal according to [facility]) criteria. Site Region Measured Measured WHO Young Adult BMD Date       Age      Classification T-score AP Spine L1-L4 9/18/2016 69.2 Normal 0.6 1.265 g/cm2 DualFemur Neck Left 08/18/2016 69.2 Normal -0.6 0.961 g/cm2 World Health Organization [facility]) criteria for  post-menopausal, Caucasian Women: Normal:       T-score at or above -1 SD Osteopenia:   T-score between -1 and -2.5 SD Osteoporosis: T-score at or below -2.5 SD RECOMMENDATIONS: [place] recommends that FDA-approved medical therapies be considered in postmenopausal women and men age 48 or older with a: 1. Hip or vertebral (clinical or morphometric) fracture. 2. T-score of < -2.5 at the spine or hip. 3. Ten-year fracture probability by [facility]  of 3% or greater for hip fracture or 20% or greater for major osteoporotic fracture. All treatment decisions require clinical judgment and consideration of individual patient factors, including patient preferences, co-morbidities, previous drug use, risk factors not captured in the FRAX model (e.g. falls, vitamin D deficiency, increased bone turnover, interval significant decline in bone density) and possible under - or over-estimation of fracture risk by FRAX. All patients should ensure an adequate intake of dietary calcium (1200 mg/d) and vitamin D (800 IU daily) unless contraindicated. FOLLOW-UP: People with diagnosed cases of osteoporosis or at high risk for fracture should have regular bone mineral density tests. For patients eligible for Medicare, routine testing is allowed once every 2 years. The testing frequency can be increased to one year for patients who have rapidly progressing disease, those who are receiving or discontinuing medical therapy to restore bone mass, or have additional risk factors. I have reviewed this report, and agree with the above findings. [facility] Electronically Signed   By: Lajean Manes M.D.   On: 08/19/2016 13:16   Mr Jodene Nam Head/brain [id number] Cm  Result Date: 08/22/2016 CLINICAL DATA:  History of hypertension and stroke. Altered mental status with speech disturbance. EXAM: MRI HEAD WITHOUT CONTRAST MRA HEAD WITHOUT CONTRAST TECHNIQUE: Multiplanar, multiecho pulse sequences of the brain and surrounding  structures were obtained without intravenous contrast. Angiographic images of the head were obtained using MRA technique without contrast. COMPARISON:  Head CT 08/21/2016 FINDINGS: MRI HEAD FINDINGS Brain: Diffusion imaging does not show any acute or subacute infarction. The brainstem and cerebellum are normal. Cerebral hemispheres show moderate chronic small-vessel ischemic changes affecting the deep and subcortical white matter. No cortical or large vessel territory infarction. No mass lesion, hemorrhage, hydrocephalus or extra-axial collection. Vascular: Major vessels at the base of the brain show flow. Skull and upper cervical spine: Negative Sinuses/Orbits: Clear/ normal Other: None significant MRA HEAD FINDINGS Both internal carotid arteries are widely patent through the skullbase and siphon regions. The anterior and middle cerebral vessels are patent without proximal stenosis, aneurysm or vascular malformation. Both vertebral arteries are widely patent to the basilar. No basilar stenosis. Posterior circulation branch vessels appear normal. IMPRESSION: No acute finding. Moderate chronic small-vessel ischemic changes of the cerebral hemispheric white matter. Normal intracranial MR angiography of the large and medium size vessels. Electronically Signed   By: Nelson Chimes M.D.   On: 08/22/2016 11:43   Ct Head Code Stroke W/o Cm  Result Date: 08/21/2016 CLINICAL DATA:  Code stroke. Left-sided weakness. Colon cancer and cervical cancer. EXAM: CT HEAD WITHOUT CONTRAST TECHNIQUE: Contiguous axial images were obtained from the base of the skull through the vertex without intravenous contrast. COMPARISON:  CT head 12/25/2008 FINDINGS: Brain: No evidence of acute infarction, hemorrhage, hydrocephalus, extra-axial collection or mass lesion/mass effect. Vascular: No hyperdense vessel or unexpected calcification. Skull: Negative Sinuses/Orbits: Negative Other: None ASPECTS ([place] Stroke Program Early CT Score) -  Ganglionic level infarction (caudate, lentiform nuclei, internal capsule, insula, M1-M3 cortex): 7 - Supraganglionic infarction (M4-M6 cortex): 3 Total score (0-10 with 10 being normal): 10 IMPRESSION: 1. Negative CT head 2. ASPECTS is 10 3. These results were called by telephone at the time of interpretation on 08/21/2016 at 6:24 pm to Dr. Lenise Arena , who verbally acknowledged these results. Electronically Signed   By: Franchot Gallo M.D.   On: 08/21/2016 18:25    EKG:   Orders placed or performed during the hospital encounter of 08/21/16  . ED EKG  . ED EKG  .  EKG 12-Lead  . EKG 12-Lead      Management plans discussed with the patient, Husband and they are in agreement.  CODE STATUS:     Code Status Orders        Start     Ordered   08/21/16 2300  Full code  Continuous     08/21/16 2300    Code Status History    Date Active Date Inactive Code Status Order ID Comments User Context   This patient has a current code status but no historical code status.      TOTAL TIME TAKING CARE OF THIS PATIENT: 42 minutes.   Note: This dictation was prepared with Dragon dictation along with smaller phrase technology. Any transcriptional errors that result from this process are unintentional.   @[facility] @  on 08/23/2016 at 12:05 PM  Between 7am to 6pm - Pager - [phone]  After 6pm go to [email or web address] - password [email or web address] [facility]  Office  [phone]  CC: Primary care physician; Letta Median, MD

## 2016-08-23 NOTE — Evaluation (Signed)
Occupational Therapy Evaluation Patient Details Name: Kendra Ritter MRN: [id number] DOB: Dec 23, 1945 Today's Date: 08/23/2016    History of Present Illness Pt. is a 70 y.o female who was admitted to [facility] with a TIA.    Clinical Impression   Pt. is a 70 y.o. female who was admitted to [facility] with a TIA. Pt. reports being back to baseline with her UE functioning, and daily self-care tasks. No further OT services are warranted at this time.           Follow Up Recommendations    No further OT services   Equipment Recommendations       Recommendations for Other Services       Precautions / Restrictions Restrictions Weight Bearing Restrictions: No                                                     ADL Overall ADL's : Needs assistance/impaired                                       General ADL Comments: Pt. reports being back to baseline of independence with her ADL/self-care tasks.      Vision  No change from baseline.   Perception     Praxis      Pertinent Vitals/Pain Pain Assessment: No/denies pain     Hand Dominance Right   Extremity/Trunk Assessment Upper Extremity Assessment Upper Extremity Assessment: Overall WFL for tasks assessed           Communication Communication Communication: No difficulties   Cognition Arousal/Alertness: Awake/alert Behavior During Therapy: WFL for tasks assessed/performed Overall Cognitive Status: Within Functional Limits for tasks assessed                     General Comments       Exercises       Shoulder Instructions      Home Living Family/patient expects to be discharged to:: Private residence Living Arrangements: Spouse/significant other   Type of Home: House Home Access: Stairs to enter Technical brewer of Steps: 5 Entrance Stairs-Rails: Right;Left;Can reach both Home Layout: One level     Bathroom Shower/Tub: Tub/shower unit         Home  Equipment: None          Prior Functioning/Environment Level of Independence: Independent                 OT Problem List:     OT Treatment/Interventions:      OT Goals(Current goals can be found in the care plan section) Acute Rehab OT Goals Patient Stated Goal: Pt. reports wanting to go home. OT Goal Formulation: With patient Potential to Achieve Goals: Good  OT Frequency:     Barriers to D/C:            Co-evaluation              End of Session    Activity Tolerance: Patient tolerated treatment well Patient left: in bed;with call bell/phone within reach;with bed alarm set   Time: [id number] OT Time Calculation (min): 15 min Charges:  OT General Charges $OT Visit: 1 Procedure OT Evaluation $OT Eval Moderate Complexity: 1 Procedure G-Codes:    Harrel Carina,  MS, OTR/L 08/23/2016, 10:19 AM

## 2016-08-23 NOTE — Progress Notes (Signed)
SLP Cancellation Note  Patient Details Name: Kendra Ritter MRN: [id number] DOB: 1946/06/05   Cancelled treatment:       Reason Eval/Treat Not Completed: SLP screened, no needs identified, will sign off. Chart Reviewed. Pt noted she was having no speech-language deficits nor swallowing deficits. Pt states she is at her baseline for speech-language and swallowing abilities. ST services are available for additional education and assessment if decline in status during admission. Nursing and Pt agree.    Rivka Safer, B.A. Clinical Graduate Student 08/23/2016, 9:20 AM

## 2016-08-23 NOTE — Progress Notes (Signed)
   08/23/16 1011  OT G-codes **NOT FOR INPATIENT CLASS**  Functional Assessment Tool Used Clinical judgement based on pt. current functional level  Functional Limitation Self care  Self Care Current Status [phone]) [facility]  Self Care Goal Status [id number]) [place]  08/23/2016: Late Entry G-Code Entry by Harrel Carina, MS, OTR/L. Initial eval/assessment performed by Harrel Carina, MS, OTR/L

## 2016-08-25 LAB — O&P RESULT

## 2016-08-25 LAB — OVA + PARASITE EXAM

## 2016-08-28 LAB — STOOL CULTURE: E COLI SHIGA TOXIN ASSAY: NEGATIVE

## 2016-08-28 LAB — STOOL CULTURE REFLEX - CMPCXR

## 2016-08-28 LAB — STOOL CULTURE REFLEX - RSASHR

## 2016-09-08 LAB — WBCS, STOOL: WBCS, STOOL: NONE SEEN

## 2016-10-18 NOTE — Addendum Note (Signed)
Encounter addended by: Hillis Range, PT on: 10/18/2016  8:58 AM<BR>    Actions taken: Visit Navigator Flowsheet section accepted, Pend clinical note, Sign clinical note

## 2016-10-18 NOTE — Progress Notes (Signed)
Physical Therapy Evaluation Patient Details Name: Kendra Ritter MRN: [id number] DOB: [birth year] Today's Date: 10/18/2016   Late Entry Gcodes for eval (date of service 08/22/16)  Functional assessment used: clinical judgement/gait ability Mobility Gcode: G8978 CI G8979CI G8980 CI 1-20% impaired for current, goal and discharge status.     Trotter,Margaret PT, DPT 10/18/2016, 8:53 AM

## 2016-10-25 ENCOUNTER — Emergency Department: Payer: Medicare Other

## 2016-10-25 ENCOUNTER — Encounter: Payer: Self-pay | Admitting: Intensive Care

## 2016-10-25 ENCOUNTER — Emergency Department
Admission: EM | Admit: 2016-10-25 | Discharge: 2016-10-25 | Disposition: A | Discharge status: Home / Self Care | Payer: Medicare Other | Attending: Emergency Medicine | Admitting: Emergency Medicine

## 2016-10-25 DIAGNOSIS — Z85038 Personal history of other malignant neoplasm of large intestine: Secondary | ICD-10-CM | POA: Diagnosis not present

## 2016-10-25 DIAGNOSIS — Z79899 Other long term (current) drug therapy: Secondary | ICD-10-CM | POA: Diagnosis not present

## 2016-10-25 DIAGNOSIS — I10 Essential (primary) hypertension: Secondary | ICD-10-CM | POA: Insufficient documentation

## 2016-10-25 DIAGNOSIS — N390 Urinary tract infection, site not specified: Secondary | ICD-10-CM | POA: Diagnosis not present

## 2016-10-25 DIAGNOSIS — N321 Vesicointestinal fistula: Secondary | ICD-10-CM | POA: Insufficient documentation

## 2016-10-25 DIAGNOSIS — R339 Retention of urine, unspecified: Secondary | ICD-10-CM | POA: Diagnosis present

## 2016-10-25 DIAGNOSIS — Z7982 Long term (current) use of aspirin: Secondary | ICD-10-CM | POA: Diagnosis not present

## 2016-10-25 DIAGNOSIS — R319 Hematuria, unspecified: Secondary | ICD-10-CM

## 2016-10-25 HISTORY — DX: Heart failure, unspecified: I50.9

## 2016-10-25 LAB — WET PREP, GENITAL
Clue Cells Wet Prep HPF POC: NONE SEEN
SPERM: NONE SEEN
Trich, Wet Prep: NONE SEEN
Yeast Wet Prep HPF POC: NONE SEEN

## 2016-10-25 LAB — URINALYSIS, COMPLETE (UACMP) WITH MICROSCOPIC
BILIRUBIN URINE: NEGATIVE
Glucose, UA: NEGATIVE mg/dL
Ketones, ur: NEGATIVE mg/dL
Nitrite: NEGATIVE
Protein, ur: 100 mg/dL — AB
SPECIFIC GRAVITY, URINE: 1.018 (ref 1.005–1.030)
pH: 5 (ref 5.0–8.0)

## 2016-10-25 LAB — CBC
HEMATOCRIT: 27.2 % — AB (ref 35.0–47.0)
HEMOGLOBIN: 9.1 g/dL — AB (ref 12.0–16.0)
MCH: 25.7 pg — ABNORMAL LOW (ref 26.0–34.0)
MCHC: 33.3 g/dL (ref 32.0–36.0)
MCV: 77.3 fL — AB (ref 80.0–100.0)
PLATELETS: 670 10*3/uL — AB (ref 150–440)
RBC: 3.52 MIL/uL — AB (ref 3.80–5.20)
RDW: 17.2 % — ABNORMAL HIGH (ref 11.5–14.5)
WBC: 9.2 10*3/uL (ref 3.6–11.0)

## 2016-10-25 LAB — COMPREHENSIVE METABOLIC PANEL
ALT: 7 U/L — ABNORMAL LOW (ref 14–54)
ANION GAP: 8 (ref 5–15)
AST: 23 U/L (ref 15–41)
Albumin: 3.2 g/dL — ABNORMAL LOW (ref 3.5–5.0)
Alkaline Phosphatase: 71 U/L (ref 38–126)
BUN: 18 mg/dL (ref 6–20)
CHLORIDE: 101 mmol/L (ref 101–111)
CO2: 30 mmol/L (ref 22–32)
Calcium: 8.6 mg/dL — ABNORMAL LOW (ref 8.9–10.3)
Creatinine, Ser: 1.08 mg/dL — ABNORMAL HIGH (ref 0.44–1.00)
GFR, EST AFRICAN AMERICAN: 59 mL/min — AB (ref >60)
GFR, EST NON AFRICAN AMERICAN: 51 mL/min — AB (ref >60)
Glucose, Bld: 114 mg/dL — ABNORMAL HIGH (ref 65–99)
POTASSIUM: 2.7 mmol/L — AB (ref 3.5–5.1)
Sodium: 139 mmol/L (ref 135–145)
Total Bilirubin: 0.4 mg/dL (ref 0.3–1.2)
Total Protein: 7.4 g/dL (ref 6.5–8.1)

## 2016-10-25 LAB — CHLAMYDIA/NGC RT PCR (ARMC ONLY)
Chlamydia Tr: NOT DETECTED
N GONORRHOEAE: NOT DETECTED

## 2016-10-25 LAB — LIPASE, BLOOD: LIPASE: 43 U/L (ref 11–51)

## 2016-10-25 MED ORDER — IOPAMIDOL (ISOVUE-300) INJECTION 61%
30.0000 mL | Freq: Once | INTRAVENOUS | Status: AC | PRN
  Administered 2016-10-25: 30 mL via ORAL

## 2016-10-25 MED ORDER — CEFTRIAXONE SODIUM-DEXTROSE 1-3.74 GM-% IV SOLR
1.0000 g | Freq: Once | INTRAVENOUS | Status: AC
  Administered 2016-10-25: 1 g via INTRAVENOUS
  Filled 2016-10-25: qty 50

## 2016-10-25 MED ORDER — POTASSIUM CHLORIDE CRYS ER 20 MEQ PO TBCR
40.0000 meq | EXTENDED_RELEASE_TABLET | Freq: Once | ORAL | Status: AC
  Administered 2016-10-25: 40 meq via ORAL
  Filled 2016-10-25: qty 2

## 2016-10-25 MED ORDER — CEPHALEXIN 500 MG PO CAPS: 500.0000 mg | ORAL_CAPSULE | Freq: Three times a day (TID) | ORAL | 0 refills | Status: DC

## 2016-10-25 MED ORDER — DEXTROSE 5 % IV SOLN: 1.0000 g | Freq: Once | INTRAVENOUS | Status: DC

## 2016-10-25 MED ORDER — IOPAMIDOL (ISOVUE-300) INJECTION 61%
100.0000 mL | Freq: Once | INTRAVENOUS | Status: AC | PRN
  Administered 2016-10-25: 100 mL via INTRAVENOUS

## 2016-10-25 NOTE — ED Provider Notes (Signed)
[facility] Emergency Department Provider Note   ____________________________________________   I have reviewed the triage vital signs and the nursing notes.   HISTORY  Chief Complaint Rectal Pain and Urinary Retention   History limited by: Not Limited   HPI Kendra Ritter is a 71 y.o. female who presents to the emergency department today because of concern for pain in her rectum and difficulty with urination. The patient states that the symptoms have been going on for a couple of weeks. The pain is located from her rectum to her genitalia. She states the pain has been constant. It is worse with pressure or when she tries to pee. She has not noticed any bad odor to her urine. Is only able to urinate drops when she needs to go. She denies any associated fevers. No nausea or vomiting.   Past Medical History:  Diagnosis Date  . Anemia   . BPPV (benign paroxysmal positional vertigo)   . Colon cancer ([place]) 1998  . GERD (gastroesophageal reflux disease)   . H/O aortic valve replacement   . Heart murmur   . Hx of cervical cancer   . Hypertension   . Stroke [facility])    multiple strokes, 2010, 2011, 2013     Patient Active Problem List   Diagnosis Date Noted  . Transient cerebral ischemia   . TIA (transient ischemic attack) 08/21/2016  . Chest pain 03/06/2014  . S/P MVR (mitral valve repair) 03/06/2014  . Murmur 03/06/2014  . SOB (shortness of breath) 03/06/2014  . HTN (hypertension) 03/06/2014    Past Surgical History:  Procedure Laterality Date  . AORTIC VALVE REPLACEMENT  2008   [facility]  . APPENDECTOMY    . CARDIAC CATHETERIZATION  2008   [facility]  . COLOSTOMY  1998   After colon cancer   . partial hysterectomy     cervical cancer    Prior to Admission medications   Medication Sig Start Date End Date Taking? Authorizing Provider  amLODipine (NORVASC) 10 MG tablet Take 1 tablet (10 mg total) by mouth daily. 04/10/14   Minna Merritts, MD  aspirin EC  81 MG EC tablet Take 1 tablet (81 mg total) by mouth daily. 08/24/16   Nicholes Mango, MD  carvedilol (COREG) 25 MG tablet Take 25 mg by mouth 2 (two) times daily with a meal.    Historical Provider, MD  ciprofloxacin (CIPRO) 250 MG tablet Take 1 tablet (250 mg total) by mouth 2 (two) times daily. 08/23/16   Nicholes Mango, MD  ferrous sulfate 325 (65 FE) MG tablet Take 325 mg by mouth daily with breakfast.    Historical Provider, MD  gabapentin (NEURONTIN) 300 MG capsule Take 300 mg by mouth 3 (three) times daily.  01/29/14   Historical Provider, MD  hydrochlorothiazide (HYDRODIURIL) 25 MG tablet Take 25 mg by mouth daily.    Historical Provider, MD  losartan (COZAAR) 100 MG tablet Take 100 mg by mouth daily.    Historical Provider, MD  omeprazole (PRILOSEC) 20 MG capsule Take 20 mg by mouth daily.    Historical Provider, MD    Allergies Lisinopril  Family History  Problem Relation Age of Onset  . Heart disease Mother   . Heart attack Mother   . Hypertension Mother   . Breast cancer Maternal Aunt     Social History Social History  Substance Use Topics  . Smoking status: Never Smoker  . Smokeless tobacco: Never Used  . Alcohol use No  Review of Systems  Constitutional: Negative for fever. Cardiovascular: Negative for chest pain. Respiratory: Negative for shortness of breath. Gastrointestinal: Pain in the rectum. Genitourinary: Decreased ability to urinate.  Musculoskeletal: Negative for back pain. Skin: Negative for rash. Neurological: Negative for headaches, focal weakness or numbness.  10-point ROS otherwise negative.  ____________________________________________   PHYSICAL EXAM:  VITAL SIGNS: ED Triage Vitals  Enc Vitals Group     BP 10/25/16 1140 128/67     Pulse Rate 10/25/16 1140 63     Resp 10/25/16 1140 16     Temp 10/25/16 1140 98.4 F (36.9 C)     Temp Source 10/25/16 1140 Oral     SpO2 10/25/16 1140 100 %     Weight 10/25/16 1141 158 lb (71.7 kg)      Height 10/25/16 1141 5\' 1"  (1.549 m)     Head Circumference --      Peak Flow --      Pain Score 10/25/16 1141 10    Constitutional: Alert and oriented. Well appearing and in no distress. Eyes: Conjunctivae are normal. Normal extraocular movements. ENT   Head: Normocephalic and atraumatic.   Nose: No congestion/rhinnorhea.   Mouth/Throat: Mucous membranes are moist.   Neck: No stridor. Hematological/Lymphatic/Immunilogical: No cervical lymphadenopathy. Cardiovascular: Normal rate, regular rhythm.  No murmurs, rubs, or gallops.  Respiratory: Normal respiratory effort without tachypnea nor retractions. Breath sounds are clear and equal bilaterally. No wheezes/rales/rhonchi. Gastrointestinal: Soft and mildly tender to palpation in the lower abdomen.  No rebound. No guarding.  Genitourinary: Deferred Musculoskeletal: Normal range of motion in all extremities. No lower extremity edema. Neurologic:  Normal speech and language. No gross focal neurologic deficits are appreciated.  Skin:  Skin is warm, dry and intact. No rash noted. Psychiatric: Mood and affect are normal. Speech and behavior are normal. Patient exhibits appropriate insight and judgment.  ____________________________________________    LABS (pertinent positives/negatives)  Labs Reviewed  WET PREP, GENITAL - Abnormal; Notable for the following:       Result Value   WBC, Wet Prep HPF POC FEW (*)    All other components within normal limits  COMPREHENSIVE METABOLIC PANEL - Abnormal; Notable for the following:    Potassium 2.7 (*)    Glucose, Bld 114 (*)    Creatinine, Ser 1.08 (*)    Calcium 8.6 (*)    Albumin 3.2 (*)    ALT 7 (*)    GFR calc non Af Amer 51 (*)    GFR calc Af Amer 59 (*)    All other components within normal limits  CBC - Abnormal; Notable for the following:    RBC 3.52 (*)    Hemoglobin 9.1 (*)    HCT 27.2 (*)    MCV 77.3 (*)    MCH 25.7 (*)    RDW 17.2 (*)    Platelets 670 (*)     All other components within normal limits  URINALYSIS, COMPLETE (UACMP) WITH MICROSCOPIC - Abnormal; Notable for the following:    Color, Urine YELLOW (*)    APPearance CLOUDY (*)    Hgb urine dipstick MODERATE (*)    Protein, ur 100 (*)    Leukocytes, UA LARGE (*)    Bacteria, UA MANY (*)    Squamous Epithelial / LPF 6-30 (*)    Non Squamous Epithelial 0-5 (*)    All other components within normal limits  CHLAMYDIA/NGC RT PCR ([facility] ONLY)  LIPASE, BLOOD     ____________________________________________   EKG  None  ____________________________________________    RADIOLOGY  CT abd/pel IMPRESSION: 1. Fistulous connection between the small bowel of the lower pelvis and the the anterior wall of the bladder, best seen on coronal reconstructed images (series 5, images 44 through 46). Associated thickening of the anterior walls of the bladder, associated thickening of the walls of the adjacent small bowel and associated air in the bladder. The thickening of the walls of the small bowel in the pelvis could be reactive to the adjacent fistula or indicative of a concomitant small bowel enteritis of infectious, inflammatory or ischemic nature. 2. Status post partial colectomy with left lower quadrant colostomy creation. No bowel obstruction. 3. Small amount of free fluid in the pelvis. No circumscribed abscess collection identified. No free intraperitoneal air identified. 4. Left-sided hydroureter, mild to moderate in degree. No obstructing stone seen. The hydroureter is suspicious for ascending infection. 5. Aortic atherosclerosis. Additional chronic/incidental findings detailed above.  I, Nance Pear, personally discussed these images and results by phone with the on-call radiologist and used this discussion as part of my medical decision making.     ____________________________________________   PROCEDURES  Procedures  ____________________________________________   INITIAL IMPRESSION / ASSESSMENT AND PLAN / ED COURSE  Pertinent labs & imaging results that were available during my care of the patient were reviewed by me and considered in my medical decision making (see chart for details).  Patient presented to the emergency department today because of concerns for lower abdominal pain. On exam patient did have some mild lower abdominal tenderness. Urine was concerning for infection. The patient had a CT performed given the tenderness. This was concerning for a possible vicious connection between the small bowel and the bladder. I discussed this finding with the patient. Patient was given dose of IV antibiotics given concern for UTI. Patient will follow-up with surgery.  ____________________________________________   FINAL CLINICAL IMPRESSION(S) / ED DIAGNOSES  Final diagnoses:  Enterovesical fistula  Urinary tract infection with hematuria, site unspecified     Note: This dictation was prepared with Dragon dictation. Any transcriptional errors that result from this process are unintentional     Nance Pear, MD 10/25/16 1820

## 2016-10-25 NOTE — ED Notes (Signed)
Pelvic cart set up outside of room.

## 2016-10-25 NOTE — Discharge Instructions (Signed)
Please seek medical attention for any high fevers, chest pain, shortness of breath, change in behavior, persistent vomiting, bloody stool or any other new or concerning symptoms.  

## 2016-10-25 NOTE — ED Triage Notes (Signed)
Patient reports for the last couple of weeks she has been having constant rectal pain and urinary retention. States "When I sneeze or cough the pain is even worse" Denies bleeding or abnormal D/C. 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 Denies any problems with colostomy. Reports when she goes to use the bathroom, there is very little output. PCP appointment scheduled for the 25th and patient reports not being able to wait that long.

## 2016-10-25 NOTE — ED Notes (Signed)
Called CT that patient finished contrast.

## 2016-10-27 ENCOUNTER — Ambulatory Visit: Payer: Self-pay | Admitting: Surgery

## 2016-10-31 ENCOUNTER — Emergency Department: Payer: Medicare Other

## 2016-10-31 ENCOUNTER — Emergency Department
Admission: EM | Admit: 2016-10-31 | Discharge: 2016-10-31 | Disposition: A | Discharge status: Home / Self Care | Payer: Medicare Other | Attending: Emergency Medicine | Admitting: Emergency Medicine

## 2016-10-31 DIAGNOSIS — Z7982 Long term (current) use of aspirin: Secondary | ICD-10-CM | POA: Insufficient documentation

## 2016-10-31 DIAGNOSIS — I110 Hypertensive heart disease with heart failure: Secondary | ICD-10-CM | POA: Diagnosis not present

## 2016-10-31 DIAGNOSIS — Z79899 Other long term (current) drug therapy: Secondary | ICD-10-CM | POA: Diagnosis not present

## 2016-10-31 DIAGNOSIS — R102 Pelvic and perineal pain: Secondary | ICD-10-CM | POA: Diagnosis present

## 2016-10-31 DIAGNOSIS — I509 Heart failure, unspecified: Secondary | ICD-10-CM | POA: Diagnosis not present

## 2016-10-31 DIAGNOSIS — Z952 Presence of prosthetic heart valve: Secondary | ICD-10-CM | POA: Insufficient documentation

## 2016-10-31 DIAGNOSIS — N321 Vesicointestinal fistula: Secondary | ICD-10-CM

## 2016-10-31 DIAGNOSIS — Z85038 Personal history of other malignant neoplasm of large intestine: Secondary | ICD-10-CM | POA: Diagnosis not present

## 2016-10-31 DIAGNOSIS — K632 Fistula of intestine: Secondary | ICD-10-CM | POA: Insufficient documentation

## 2016-10-31 DIAGNOSIS — Z8541 Personal history of malignant neoplasm of cervix uteri: Secondary | ICD-10-CM | POA: Insufficient documentation

## 2016-10-31 LAB — CBC WITH DIFFERENTIAL/PLATELET
BASOS ABS: 0.1 10*3/uL (ref 0–0.1)
BASOS PCT: 1 %
Eosinophils Absolute: 0.2 10*3/uL (ref 0–0.7)
Eosinophils Relative: 3 %
HCT: 28.9 % — ABNORMAL LOW (ref 35.0–47.0)
HEMOGLOBIN: 9.5 g/dL — AB (ref 12.0–16.0)
LYMPHS ABS: 1.4 10*3/uL (ref 1.0–3.6)
Lymphocytes Relative: 17 %
MCH: 26.2 pg (ref 26.0–34.0)
MCHC: 33 g/dL (ref 32.0–36.0)
MCV: 79.6 fL — ABNORMAL LOW (ref 80.0–100.0)
Monocytes Absolute: 0.7 10*3/uL (ref 0.2–0.9)
Monocytes Relative: 8 %
NEUTROS PCT: 71 %
Neutro Abs: 6 10*3/uL (ref 1.4–6.5)
Platelets: 611 10*3/uL — ABNORMAL HIGH (ref 150–440)
RBC: 3.63 MIL/uL — AB (ref 3.80–5.20)
RDW: 18 % — ABNORMAL HIGH (ref 11.5–14.5)
WBC: 8.5 10*3/uL (ref 3.6–11.0)

## 2016-10-31 LAB — COMPREHENSIVE METABOLIC PANEL
ALT: 9 U/L — AB (ref 14–54)
AST: 24 U/L (ref 15–41)
Albumin: 3.3 g/dL — ABNORMAL LOW (ref 3.5–5.0)
Alkaline Phosphatase: 75 U/L (ref 38–126)
Anion gap: 8 (ref 5–15)
BUN: 22 mg/dL — ABNORMAL HIGH (ref 6–20)
CHLORIDE: 105 mmol/L (ref 101–111)
CO2: 25 mmol/L (ref 22–32)
CREATININE: 1.48 mg/dL — AB (ref 0.44–1.00)
Calcium: 8.7 mg/dL — ABNORMAL LOW (ref 8.9–10.3)
GFR, EST AFRICAN AMERICAN: 41 mL/min — AB (ref >60)
GFR, EST NON AFRICAN AMERICAN: 35 mL/min — AB (ref >60)
Glucose, Bld: 130 mg/dL — ABNORMAL HIGH (ref 65–99)
POTASSIUM: 3.3 mmol/L — AB (ref 3.5–5.1)
Sodium: 138 mmol/L (ref 135–145)
TOTAL PROTEIN: 7.2 g/dL (ref 6.5–8.1)
Total Bilirubin: 0.2 mg/dL — ABNORMAL LOW (ref 0.3–1.2)

## 2016-10-31 MED ORDER — SODIUM CHLORIDE 0.9 % IV BOLUS (SEPSIS)
1000.0000 mL | Freq: Once | INTRAVENOUS | Status: AC
  Administered 2016-10-31: 1000 mL via INTRAVENOUS

## 2016-10-31 MED ORDER — MORPHINE SULFATE (PF) 2 MG/ML IV SOLN
2.0000 mg | Freq: Once | INTRAVENOUS | Status: AC
  Administered 2016-10-31: 2 mg via INTRAVENOUS

## 2016-10-31 MED ORDER — HYDROCODONE-ACETAMINOPHEN 5-325 MG PO TABS: 1.0000 | ORAL_TABLET | Freq: Three times a day (TID) | ORAL | 0 refills | Status: DC | PRN

## 2016-10-31 MED ORDER — ONDANSETRON HCL 4 MG/2ML IJ SOLN
4.0000 mg | Freq: Once | INTRAMUSCULAR | Status: AC
  Administered 2016-10-31: 4 mg via INTRAVENOUS
  Filled 2016-10-31: qty 2

## 2016-10-31 MED ORDER — MORPHINE SULFATE (PF) 4 MG/ML IV SOLN
4.0000 mg | Freq: Once | INTRAVENOUS | Status: DC
  Filled 2016-10-31: qty 1

## 2016-10-31 MED ORDER — IOPAMIDOL (ISOVUE-300) INJECTION 61%
75.0000 mL | Freq: Once | INTRAVENOUS | Status: AC | PRN
  Administered 2016-10-31: 75 mL via INTRAVENOUS
  Filled 2016-10-31: qty 75

## 2016-10-31 MED ORDER — IOPAMIDOL (ISOVUE-300) INJECTION 61%
15.0000 mL | INTRAVENOUS | Status: AC
  Administered 2016-10-31: 15 mL via ORAL
  Filled 2016-10-31 (×2): qty 15

## 2016-10-31 NOTE — ED Triage Notes (Addendum)
Pt arrives to ER via POV c/o burning with urination, frequency. Pt currently being treated for UTI. Pt alert and oriented X4, active, cooperative, pt in NAD. RR even and unlabored, color WNL.   Pt reports that she was diagnosed with UTI on 10/25/16 and pain has never left, reports she came today because "my husband". No new changes concerning for her to come to ER.

## 2016-10-31 NOTE — ED Notes (Signed)
Myrna from lab called and need patients blood to be recollected.  I notified Jinny Blossom, RN

## 2016-10-31 NOTE — ED Notes (Signed)
Pt reports pain in her vagina, states she was dx with UTI on 1/15. Pt states pain feels like a yeast infection and reports brown feces appearing discharge from her vagina at this time. Pt states when she was dx with UTI she was also told that her bladder and intestines were growing together. Pt is alert and oriented at this time, NAD noted.

## 2016-10-31 NOTE — ED Provider Notes (Signed)
[facility] Emergency Department Provider Note  ____________________________________________  Time seen: Approximately 7:26 PM  I have reviewed the triage vital signs and the nursing notes.   HISTORY  Chief Complaint Dysuria    HPI Kendra Ritter is a 71 y.o. female presents the emergency department with pelvic pain, urgency, frequency, foul urine odor has worsened since emergency room visit on 1/15. She states that she was seen in the emergency room on 1/15 for similar symptoms and was given IV antibiotics and discharged on Keflex. Patient states the symptoms have not improved and pelvic pain is getting worse. Patient states she now can see feces in her urine and urine has a foul odor. Patient denies fever, shortness of breath, chest pain, nausea, vomiting, abdominal pain.   Past Medical History:  Diagnosis Date  . Anemia   . BPPV (benign paroxysmal positional vertigo)   . CHF (congestive heart failure) ([place])   . Colon cancer ([place]) 1998  . GERD (gastroesophageal reflux disease)   . H/O aortic valve replacement   . Heart murmur   . Hx of cervical cancer   . Hypertension   . Stroke [facility])    multiple strokes, 2010, 2011, 2013     Patient Active Problem List   Diagnosis Date Noted  . Transient cerebral ischemia   . TIA (transient ischemic attack) 08/21/2016  . Chest pain 03/06/2014  . S/P MVR (mitral valve repair) 03/06/2014  . Murmur 03/06/2014  . SOB (shortness of breath) 03/06/2014  . HTN (hypertension) 03/06/2014    Past Surgical History:  Procedure Laterality Date  . AORTIC VALVE REPLACEMENT  2008   [facility]  . APPENDECTOMY    . CARDIAC CATHETERIZATION  2008   [facility]  . COLOSTOMY  1998   After colon cancer   . partial hysterectomy     cervical cancer    Prior to Admission medications   Medication Sig Start Date End Date Taking? Authorizing Provider  amLODipine (NORVASC) 10 MG tablet Take 1 tablet (10 mg total) by mouth daily. 04/10/14    Minna Merritts, MD  aspirin EC 81 MG EC tablet Take 1 tablet (81 mg total) by mouth daily. 08/24/16   Nicholes Mango, MD  carvedilol (COREG) 25 MG tablet Take 25 mg by mouth 2 (two) times daily with a meal.    Historical Provider, MD  cephALEXin (KEFLEX) 500 MG capsule Take 1 capsule (500 mg total) by mouth 3 (three) times daily. 10/25/16   Nance Pear, MD  ciprofloxacin (CIPRO) 250 MG tablet Take 1 tablet (250 mg total) by mouth 2 (two) times daily. 08/23/16   Nicholes Mango, MD  ferrous sulfate 325 (65 FE) MG tablet Take 325 mg by mouth daily with breakfast.    Historical Provider, MD  gabapentin (NEURONTIN) 300 MG capsule Take 300 mg by mouth 3 (three) times daily.  01/29/14   Historical Provider, MD  hydrochlorothiazide (HYDRODIURIL) 25 MG tablet Take 25 mg by mouth daily.    Historical Provider, MD  losartan (COZAAR) 100 MG tablet Take 100 mg by mouth daily.    Historical Provider, MD  omeprazole (PRILOSEC) 20 MG capsule Take 20 mg by mouth daily.    Historical Provider, MD    Allergies Lisinopril  Family History  Problem Relation Age of Onset  . Heart disease Mother   . Heart attack Mother   . Hypertension Mother   . Breast cancer Maternal Aunt     Social History Social History  Substance Use Topics  .  Smoking status: Never Smoker  . Smokeless tobacco: Never Used  . Alcohol use No     Review of Systems  Constitutional: No fever/chills ENT: No upper respiratory complaints. Cardiovascular: No chest pain. Respiratory: No SOB. Gastrointestinal: No abdominal pain.  No nausea, no vomiting.  Musculoskeletal: Negative for musculoskeletal pain. Skin: Negative for rash, abrasions, lacerations, ecchymosis. Neurological: Negative for headaches, numbness or tingling   ____________________________________________   PHYSICAL EXAM:  VITAL SIGNS: ED Triage Vitals [10/31/16 1634]  Enc Vitals Group     BP 135/68     Pulse Rate 74     Resp 18     Temp 99.4 F (37.4 C)     Temp  Source Oral     SpO2 96 %     Weight 160 lb (72.6 kg)     Height 5\' 1"  (1.549 m)     Head Circumference      Peak Flow      Pain Score 6     Pain Loc      Pain Edu?      Excl. in [place]?      Constitutional: Alert and oriented. Well appearing and in no acute distress. Eyes: Conjunctivae are normal. PERRL. EOMI. Head: Atraumatic. ENT:      Ears:      Nose: No congestion/rhinnorhea.      Mouth/Throat: Mucous membranes are moist.  Neck: No stridor.   Cardiovascular: Normal rate, regular rhythm. Good peripheral circulation. Respiratory: Normal respiratory effort without tachypnea or retractions. Lungs CTAB. Good air entry to the bases with no decreased or absent breath sounds. Gastrointestinal: Bowel sounds 4 quadrants. Soft and nontender to palpation. No guarding or rigidity. No palpable masses. No distention. No CVA tenderness. Musculoskeletal: Full range of motion to all extremities. No gross deformities appreciated. Neurologic:  Normal speech and language. No gross focal neurologic deficits are appreciated.  Skin:  Skin is warm, dry and intact. No rash noted. Psychiatric: Mood and affect are normal. Speech and behavior are normal. Patient exhibits appropriate insight and judgement.   ____________________________________________   LABS (all labs ordered are listed, but only abnormal results are displayed)  Labs Reviewed  URINALYSIS, COMPLETE (UACMP) WITH MICROSCOPIC  COMPREHENSIVE METABOLIC PANEL  CBC WITH DIFFERENTIAL/PLATELET   ____________________________________________  EKG   ____________________________________________  RADIOLOGY   No results found.  ____________________________________________    PROCEDURES  Procedure(s) performed:    Procedures    Medications  iopamidol (ISOVUE-300) 61 % injection 15 mL (not administered)     ____________________________________________   INITIAL IMPRESSION / ASSESSMENT AND PLAN / ED COURSE  Pertinent  labs & imaging results that were available during my care of the patient were reviewed by me and considered in my medical decision making (see chart for details).  Review of the [place] CSRS was performed in accordance of the [place] prior to dispensing any controlled drugs.     Patient's working diagnosis is consistent with urinary tract infection and enterovesical fistula. Vital signs and exam are reassuring. Dr. Reita Cliche was consulted and recommended ordering a CBC, CMP, urinalysis and repeat CT scan and consulting surgery. At this time, we are waiting on lab results and imagining. Care was transferred to [facility].     ____________________________________________  FINAL CLINICAL IMPRESSION(S) / ED DIAGNOSES  Final diagnoses:  None      NEW MEDICATIONS STARTED DURING THIS VISIT:  New Prescriptions   No medications on file        This chart was dictated using voice  recognition software/Dragon. Despite best efforts to proofread, errors can occur which can change the meaning. Any change was purely unintentional.    Laban Emperor, PA-C 10/31/16 1940    Lisa Roca, MD 10/31/16 2017

## 2016-10-31 NOTE — Discharge Instructions (Signed)
Your should continue to take the antibiotic as directed. Take the pain medicine as needed. Follow-up with Dr. Burt Knack on Friday as scheduled. Return to the ED for worsening pain or bleeding from the anus or with urination.

## 2016-10-31 NOTE — ED Notes (Signed)
This RN notified PA and lab technician that urine sent was contaminated with stool and would be unable to collect specimen without stool due to patient condition.

## 2016-10-31 NOTE — ED Provider Notes (Signed)
----------------------------------------- 9:20 PM on 10/31/2016 -----------------------------------------   Blood pressure 135/68, pulse 74, temperature 99.4 F (37.4 C), temperature source Oral, resp. rate 18, height 5\' 1"  (1.549 m), weight 72.6 kg, SpO2 96 %.  Assuming care from A. Earleen Newport, PA-C.  In short, Kendra Ritter is a 71 y.o. female with a chief complaint of Dysuria .  Refer to the original H&P for additional details.  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 _______________________________________________________  LABS  Labs Reviewed  COMPREHENSIVE METABOLIC PANEL - Abnormal; Notable for the following:       Result Value   Potassium 3.3 (*)    Glucose, Bld 130 (*)    BUN 22 (*)    Creatinine, Ser 1.48 (*)    Calcium 8.7 (*)    Albumin 3.3 (*)    ALT 9 (*)    Total Bilirubin 0.2 (*)    GFR calc non Af Amer 35 (*)    GFR calc Af Amer 41 (*)    All other components within normal limits  CBC WITH DIFFERENTIAL/PLATELET - Abnormal; Notable for the following:    RBC 3.63 (*)    Hemoglobin 9.5 (*)    HCT 28.9 (*)    MCV 79.6 (*)    RDW 18.0 (*)    Platelets 611 (*)    All other components within normal limits  __________________________________________________________  RADIOLOGY  CT ADB/Pelvis w/ CM  IMPRESSION: 1. Fistulous connection between small bowel and bladder as before accounting for air within the renal collecting system bilaterally and within the bladder. No significant change apart from more air noted within the bladder and air refluxed within the collecting systems. 2. Left lower quadrant colostomy with parastomal small bowel containing hernia. No bowel obstruction. 3. Cholecystectomy.  Mild fatty infiltration of liver.  CT ABD/Pelvis w/ CM (10/25/2016)  IMPRESSION: 1. Fistulous connection between the small bowel of the lower pelvis and the the anterior  wall of the bladder, best seen on coronal reconstructed images (series 5, images 44 through 46). Associated thickening of the anterior walls of the bladder, associated thickening of the walls of the adjacent small bowel and associated air in the bladder. The thickening of the walls of the small bowel in the pelvis could be reactive to the adjacent fistula or indicative of a concomitant small bowel enteritis of infectious, inflammatory or ischemic nature. 2. Status post partial colectomy with left lower quadrant colostomy creation. No bowel obstruction. 3. Small amount of free fluid in the pelvis. No circumscribed abscess collection identified. No free intraperitoneal air identified. 4. Left-sided hydroureter, mild to moderate in degree. No obstructing stone seen. The hydroureter is suspicious for ascending infection. 5. Aortic atherosclerosis. Additional chronic/incidental findings detailed above. ___________________________________________________________  PROCEDURES  Morphine 4 mg IVP Zofran 4 mg IVP NS 1000 ml IV _________________________________________________________    ----------------------------------------- 10:58 PM on 10/31/2016 -----------------------------------------  Spoke with Dr. Burt Knack regarding the case. He suggest outpatient follow-up as scheduled. Continue to dose previously prescribed Keflex TID. Patient is scheduled for office visit on Friday, January 26. 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 She is discharged with a prescription for Norco for pain relief. Continue the Keflex as directed. See Dr. Burt Knack on Friday. Return to the ED for acutely worsening pain, dysuria, or hematuria.   Final diagnoses:  Colovesical fistula     Kendra Ritter V Modesta Messing, PA-C  10/31/16 [place], MD 11/01/16 [phone]

## 2016-10-31 NOTE — ED Notes (Signed)
Pt discharged to home.  Family member driving.  Discharge instructions reviewed.  Verbalized understanding.  No questions or concerns at this time.  Teach back verified.  Pt in NAD.  No items left in ED.   

## 2016-11-02 ENCOUNTER — Other Ambulatory Visit: Payer: Self-pay

## 2016-11-05 ENCOUNTER — Ambulatory Visit: Payer: Self-pay | Admitting: Surgery

## 2016-11-08 ENCOUNTER — Other Ambulatory Visit: Payer: Self-pay

## 2016-11-09 ENCOUNTER — Encounter: Payer: Self-pay | Admitting: Surgery

## 2016-11-09 ENCOUNTER — Ambulatory Visit (INDEPENDENT_AMBULATORY_CARE_PROVIDER_SITE_OTHER): Payer: Medicare Other | Admitting: Surgery

## 2016-11-09 VITALS — BP 167/101 | HR 94 | Temp 98.8°F | Ht 63.0 in | Wt 164.0 lb

## 2016-11-09 DIAGNOSIS — N321 Vesicointestinal fistula: Secondary | ICD-10-CM

## 2016-11-09 NOTE — Patient Instructions (Addendum)
We will send your referral to see [facility] Colorectal Surgery. They will call you with your appointment date and time.   Dr. Rebeca Alert will see you tomorrow at 1:00 PM but to arrive at 12:40 PM so they could check you in. Please bring your medications to your appointment.

## 2016-11-11 NOTE — Progress Notes (Signed)
Patient ID: Kendra Ritter, female   DOB: 06-29-1946, 71 y.o.   MRN: [id number]  HPI Kendra Ritter is a 71 y.o. female w a previous hx of total colectomy w and end ileostomy more than 20 years ago at [facility] for colon CA. Now presents with low pelvic pain, moderate and sharp . Intermittent . She also reports foul smelling urine, feces in her urine. No chills, fevers. She has associated urgency and frequency. Recent visit to the ED prompted a CT scan that I have personally reviewed. There is air within the bladder and ureters, fistula between SB and Bladder. No free air. There is also a parastomal hernia no signs of incarceration. She has a hx of CHF, s/p MVR ([facility]) multiple strokes. Reviewed remote access chart and apparently she did have radium implant in the 1970s. EF 40%.  HPI  Past Medical History:  Diagnosis Date  . Anemia   . BPPV (benign paroxysmal positional vertigo)   . CHF (congestive heart failure) ([place])   . Colon cancer ([place]) 1998  . GERD (gastroesophageal reflux disease)   . H/O aortic valve replacement   . Heart murmur   . Hx of cervical cancer   . Hypertension   . Stroke [facility])    multiple strokes, 2010, 2011, 2013     Past Surgical History:  Procedure Laterality Date  . AORTIC VALVE REPLACEMENT  2008   [facility]  . APPENDECTOMY    . CARDIAC CATHETERIZATION  2008   [facility]  . COLOSTOMY  1998   After colon cancer   . partial hysterectomy     cervical cancer    Family History  Problem Relation Age of Onset  . Heart disease Mother   . Heart attack Mother   . Hypertension Mother   . Breast cancer Maternal Aunt     Social History Social History  Substance Use Topics  . Smoking status: Never Smoker  . Smokeless tobacco: Never Used  . Alcohol use No    Allergies  Allergen Reactions  . Lisinopril Anaphylaxis and Cough    Other reaction(s): Other (See Comments) Other Reaction: bad cough    Current Outpatient Prescriptions  Medication Sig Dispense Refill  .  amLODipine (NORVASC) 10 MG tablet Take 1 tablet (10 mg total) by mouth daily. 30 tablet 6  . aspirin EC 81 MG EC tablet Take 1 tablet (81 mg total) by mouth daily.    . carvedilol (COREG) 25 MG tablet Take 25 mg by mouth 2 (two) times daily with a meal.    . ferrous sulfate 325 (65 FE) MG tablet Take 325 mg by mouth daily with breakfast.    . gabapentin (NEURONTIN) 300 MG capsule Take 300 mg by mouth 3 (three) times daily.     . hydrochlorothiazide (HYDRODIURIL) 25 MG tablet Take 25 mg by mouth daily.    Marland Kitchen latanoprost (XALATAN) 0.005 % ophthalmic solution Place 1 drop into both eyes at bedtime.    Marland Kitchen losartan (COZAAR) 100 MG tablet Take 100 mg by mouth daily.    . meclizine (ANTIVERT) 25 MG tablet Take 1 tablet by mouth 1 day or 1 dose.    Marland Kitchen omeprazole (PRILOSEC) 20 MG capsule Take 20 mg by mouth daily.    . potassium chloride (KLOR-CON) 20 MEQ packet Take 1 packet by mouth 1 day or 1 dose.     No current facility-administered medications for this visit.      Review of Systems A 10 point review of  systems was asked and was negative except for the information on the HPI  Physical Exam Blood pressure (!) 167/101, pulse 94, temperature 98.8 F (37.1 C), temperature source Oral, height 5\' 3"  (1.6 m), weight 74.4 kg (164 lb). CONSTITUTIONAL:NAD EYES: Pupils are equal, round, and reactive to light, Sclera are non-icteric. EARS, NOSE, MOUTH AND THROAT: The oropharynx is clear. The oral mucosa is pink and moist. Hearing is intact to voice. LYMPH NODES:  Lymph nodes in the neck are normal. RESPIRATORY:  Lungs are clear. There is normal respiratory effort, with equal breath sounds bilaterally, and without pathologic use of accessory muscles. CARDIOVASCULAR: Heart is regular without murmurs, gallops, or rubs. GI: The abdomen is soft, nontender, and nondistended. There are no palpable masses. There is no hepatosplenomegaly. There are normal bowel sounds in all quadrants. There is an end ileostomy,  pink and patent. Reducible parastomal hernia. GU: Rectal deferred.   MUSCULOSKELETAL: Normal muscle strength and tone. No cyanosis or edema.   SKIN: Turgor is good and there are no pathologic skin lesions or ulcers. NEUROLOGIC: Motor and sensation is grossly normal. Cranial nerves are grossly intact. PSYCH:  Oriented to person, place and time. Affect is normal.  Data Reviewed I have personally reviewed the patient's imaging, laboratory findings and medical records.    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 This definitely poses a technically challenging case. She will probably need a laparotomy w sb resection, bladder repair and repair of parastomal hernia ( [place] ). I also reviewed her chart and apparently she did have implantation of radium at some point in time. GIven the complexity and potential significant complications I personally believe she is better serve at a tertiary institution. I had a lengthy discussion with the pt regarding this issue and she understands. No need for immediate surgical intervention at this time.   Caroleen Hamman, MD FACS General Surgeon 11/11/2016, 1:27 PM

## 2016-11-15 ENCOUNTER — Telehealth: Payer: Self-pay

## 2016-11-15 NOTE — Telephone Encounter (Signed)
Patient has not heard from [facility] yet for her surgery. She is in a lot of pain and has been to the emergency room twice since November . Patient is out of pain pills, still hurting and still bleeding a lot. She is considering to go to the emergency room again today but states she doesn't want to go to the one here, but to the one in [place].

## 2016-11-15 NOTE — Telephone Encounter (Signed)
A referral has been sent to [facility] to fax # [phone].  This has been sent twice as urgent.   Patient has been advised that once this is reviewed with [facility], they will call patient within 5-7 business days to make an appointment.   Patient was advised that if pain is intolerable, to go to ED for treatment.

## 2016-11-16 NOTE — Telephone Encounter (Signed)
Appointment has been made.   11/17/16 @ 10:00am--with Dr Audie Clear [address] [place] [address]  Patient is aware of appointment.

## 2017-01-20 DIAGNOSIS — E860 Dehydration: Secondary | ICD-10-CM | POA: Insufficient documentation

## 2017-07-05 ENCOUNTER — Telehealth: Payer: Self-pay | Admitting: Gastroenterology

## 2017-07-05 NOTE — Telephone Encounter (Signed)
Patient called and is ready to schedule a colonoscopy.

## 2017-07-06 NOTE — Telephone Encounter (Signed)
Left patient a voice message I returned call to schedule her colonoscopy.  Thanks [employer]

## 2017-07-08 ENCOUNTER — Telehealth: Payer: Self-pay

## 2017-07-08 NOTE — Telephone Encounter (Signed)
Patient returned phone call to schedule colonoscopy.  She has a colostomy bag.  She has been asked to schedule an appt to discuss colonoscopy.  Thanks [employer]

## 2017-07-26 ENCOUNTER — Other Ambulatory Visit: Payer: Self-pay | Admitting: Family Medicine

## 2017-07-26 DIAGNOSIS — Z1231 Encounter for screening mammogram for malignant neoplasm of breast: Secondary | ICD-10-CM

## 2017-08-10 ENCOUNTER — Ambulatory Visit
Admission: RE | Admit: 2017-08-10 | Discharge: 2017-08-10 | Disposition: A | Discharge status: Home / Self Care | Payer: Medicare Other | Source: Ambulatory Visit | Attending: Family Medicine | Admitting: Family Medicine

## 2017-08-10 DIAGNOSIS — Z1231 Encounter for screening mammogram for malignant neoplasm of breast: Secondary | ICD-10-CM | POA: Diagnosis present

## 2017-08-12 ENCOUNTER — Ambulatory Visit: Payer: Medicare Other | Admitting: Gastroenterology

## 2017-08-23 ENCOUNTER — Telehealth: Payer: Self-pay

## 2017-08-23 ENCOUNTER — Ambulatory Visit: Payer: Medicare Other | Admitting: Gastroenterology

## 2017-08-23 ENCOUNTER — Other Ambulatory Visit: Payer: Self-pay

## 2017-08-23 ENCOUNTER — Encounter: Payer: Self-pay | Admitting: Gastroenterology

## 2017-08-23 VITALS — BP 190/120 | HR 86 | Ht 63.0 in | Wt 160.0 lb

## 2017-08-23 DIAGNOSIS — D538 Other specified nutritional anemias: Secondary | ICD-10-CM

## 2017-08-23 DIAGNOSIS — K912 Postsurgical malabsorption, not elsewhere classified: Secondary | ICD-10-CM

## 2017-08-23 MED ORDER — CHOLESTYRAMINE 4 G PO PACK: 4.0000 g | PACK | Freq: Two times a day (BID) | ORAL | 2 refills | Status: DC

## 2017-08-23 NOTE — Telephone Encounter (Signed)
[place] regarding pts blood pressure during office visit.  Pt has stated that she has not taken her blood pressure medications today.  She is asymptomatic.    Pt has been advised to take her blood pressure meds as soon as she gets home.  Report to PCP Dr. Rebeca Alert as well for follow up appt.  Thank you, Sharyn Lull

## 2017-08-23 NOTE — Patient Instructions (Signed)
Pt has been advised to take BP medication upon immediately arriving at home and contact her PCP to discuss BP medication management.  After BP is managed go to [facility] for lab work ordered by Dr. Marius Ditch during todays office visit.

## 2017-08-23 NOTE — Progress Notes (Signed)
Cephas Darby, MD [address]  [place]  [place], [place] [address]  Main: [phone]  Fax: [phone]    Gastroenterology Consultation  Referring Provider:     [facility], [place] Physician:  Letta Median, MD Primary Gastroenterologist:  Dr. Cephas Darby Reason for Consultation:  Colonoscopy         HPI:   OUITA NISH is a 71 y.o. female referred by Dr. Rebeca Alert, Durene Cal, MD  for consultation & management of colonoscopy.  She has --hx cervical cancer at age 4 with radiation and radium implantation --mitral heart valve annuloplasty 2008 --1999 partial left colectomy with end colostomy for rectal cancer --12/13/2016 enterovesicular fistula takedown, ileocolonic resection to just beyond hepatic flexure, with primary bladder repair, moderately short bowel (total length ~ 130cm) at [facility]  She currently has permanent, left-sided end colostomy associated with peristomal hernia.  She reports having high ostomy output resulting in fatigue, lightheadedness.  She empties her ostomy bag every 1/2-2 hours and 3 times at night.  She reports the output is liquid brown.  She was suggested to take Imodium as needed which is not really helping.  She has not tried any fiber supplements or bile acid sequestrants.  She denies any weight loss, fever, chills, blood in stools, abdominal pain, nausea, vomiting.  Her last follow-up at [facility] with colorectal surgery was in 04/2017.  She was on TPN for short term postoperatively.  She also has a history of chronic normocytic anemia.  Her blood pressure is very high in clinic today and she did not take clonidine because she reports symptoms of lightheadedness after the medication.  She denies headache, chest pain, shortness of breath in clinic today.  NSAIDs: None  Antiplts/Anticoagulants/Anti thrombotics: None  GI Procedures: Colonoscopy at [facility] in 12/2016 via ostomy.  There is no evidence of Crohn's disease The mucosa  appeared unremarkable.  The neo terminal ileum was normal A: Colon, biopsy  - Fragments of benign colonic mucosa - No changes suggestive of ischemic colitis identified - No active inflammation, viral cytopathic effect, granulomas, or dysplasia identified A: "Small bowel and colon containing fistula to bladder", resection - Multiple colonic adenomatous polyps, two contain high grade dysplasia; no carcinoma identified - Focal full thickness colonic wall disruption (clincal history of colovesical fistula) - Two intact anastomoses - Serosal adhesions - Distal margin has focal epithelial atypia that is indefinite for low grade dysplasia - Proximal margin is unremarkable - Fourteen histologically unremarkable lymph nodes have been examined - No granulomas or viral cytopathic effect identified  Past Medical History:  Diagnosis Date  . Anemia   . BPPV (benign paroxysmal positional vertigo)   . CHF (congestive heart failure) ([place])   . Colon cancer ([place]) 1998  . GERD (gastroesophageal reflux disease)   . H/O aortic valve replacement   . Heart murmur   . Hx of cervical cancer   . Hypertension   . Stroke [facility])    multiple strokes, 2010, 2011, 2013     Past Surgical History:  Procedure Laterality Date  . AORTIC VALVE REPLACEMENT  2008   [facility]  . APPENDECTOMY    . CARDIAC CATHETERIZATION  2008   [facility]  . COLOSTOMY  1998   After colon cancer   . partial hysterectomy     cervical cancer    Prior to Admission medications   Medication Sig Start Date End Date Taking? Authorizing Provider  acetaminophen (TYLENOL) 325 MG tablet Take 650 mg  by mouth. 12/30/16   [provider]  amLODipine (NORVASC) 10 MG tablet Take 1 tablet (10 mg total) by mouth daily. 04/10/14   Minna Merritts, MD  aspirin EC 81 MG EC tablet Take 1 tablet (81 mg total) by mouth daily. 08/24/16   Nicholes Mango, MD  carvedilol (COREG) 25 MG tablet Take 25 mg by mouth 2 (two) times daily with a meal.    [provider]  cloNIDine (CATAPRES) 0.1 MG tablet  07/19/17   [provider]  ferrous sulfate 325 (65 FE) MG tablet Take 325 mg by mouth daily with breakfast.    [provider]  fluconazole (DIFLUCAN) 150 MG tablet TAKE 1 TABLET BY MOUTH NOW FOR YEAST INFECTION, REPEAT IN 3 DAYS IF SYMPTOMS PERSIST 05/18/17   [provider]  gabapentin (NEURONTIN) 300 MG capsule Take 300 mg by mouth 3 (three) times daily.  01/29/14   [provider]  hydrochlorothiazide (HYDRODIURIL) 25 MG tablet Take 25 mg by mouth daily.    [provider]  latanoprost (XALATAN) 0.005 % ophthalmic solution Place 1 drop into both eyes at bedtime. 04/22/16   [provider]  losartan (COZAAR) 100 MG tablet Take 100 mg by mouth daily.    [provider]  meclizine (ANTIVERT) 25 MG tablet Take 1 tablet by mouth 1 day or 1 dose.    [provider]  omeprazole (PRILOSEC) 20 MG capsule Take 20 mg by mouth daily.    [provider]  omeprazole (PRILOSEC) 40 MG capsule  07/19/17   [provider]  potassium chloride (KLOR-CON) 20 MEQ packet Take 1 packet by mouth 1 day or 1 dose. 09/28/16   [provider]  spironolactone (ALDACTONE) 25 MG tablet TAKE ONE TABLET BY MOUTH DAILY- TO REPLACE HCTZ 08/02/17   [provider]  triamcinolone ointment (KENALOG) 0.1 % APPLY TO AFFECTED AREA TWICE A DAY 05/18/17   [provider]    Family History  Problem Relation Age of Onset  . Heart disease Mother   . Heart attack Mother   . Hypertension Mother   . Breast cancer Maternal Aunt      Social History   Tobacco Use  . Smoking status: Never Smoker  . Smokeless tobacco: Never Used  Substance Use Topics  . Alcohol use: No  . Drug use: No    Allergies as of 08/23/2017 - Review Complete 08/23/2017  Allergen Reaction Noted  . Lisinopril Anaphylaxis and Cough 04/10/2014    Review of Systems:    All systems reviewed and  negative except where noted in HPI.   Physical Exam:  BP (!) 190/120   Pulse 86   Ht 5\' 3"  (1.6 m)   Wt 160 lb (72.6 kg)   BMI 28.34 kg/m  No LMP recorded. Patient has had a hysterectomy.  General:   Alert,  Well-developed, well-nourished, pleasant and cooperative in NAD Head:  Normocephalic and atraumatic. Eyes:  Sclera clear, no icterus.   Conjunctiva pink. Ears:  Normal auditory acuity. Nose:  No deformity, discharge, or lesions. Mouth:  No deformity or lesions,oropharynx pink & moist. Neck:  Supple; no masses or thyromegaly. Lungs:  Respirations even and unlabored.  Clear throughout to auscultation.   No wheezes, crackles, or rhonchi. No acute distress. Heart:  Regular rate and rhythm; no murmurs, clicks, rubs, or gallops. Abdomen:  Normal bowel sounds. Soft, non-tender and distended, and colostomy in the left flank region, nontender, parastomal hernia, without masses, no hepatosplenomegaly  noted.  No guarding or rebound tenderness.   Rectal: Nor performed Msk:  Symmetrical without gross deformities. Good, equal movement & strength bilaterally. Pulses:  Normal pulses noted. Extremities:  No clubbing or edema.  No cyanosis. Neurologic:  Alert and oriented x3;  grossly normal neurologically. Skin:  Intact without significant lesions or rashes. No jaundice. Lymph Nodes:  No significant cervical adenopathy. Psych:  Alert and cooperative. Normal mood and affect.  Imaging Studies: Reviewed  Assessment and Plan:   AUDRYANNA ZURITA is a 71 y.o. 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  She is probably dehydrated from high ostomy output causing her symptoms.  She has normocytic anemia which has been chronic probably anemia of chronic disease  -Start cholestyramine 4 g packet 1-2 times daily -Check  ferritin, CBC, CMP, vitamin B12, folate, electrolytes, TSH  Follow up in 3 weeks   Cephas Darby, MD

## 2017-08-26 ENCOUNTER — Other Ambulatory Visit
Admission: RE | Admit: 2017-08-26 | Discharge: 2017-08-26 | Disposition: A | Discharge status: Home / Self Care | Payer: Medicare Other | Source: Ambulatory Visit | Attending: Gastroenterology | Admitting: Gastroenterology

## 2017-08-26 DIAGNOSIS — K912 Postsurgical malabsorption, not elsewhere classified: Secondary | ICD-10-CM | POA: Insufficient documentation

## 2017-08-26 DIAGNOSIS — D538 Other specified nutritional anemias: Secondary | ICD-10-CM | POA: Diagnosis present

## 2017-08-26 DIAGNOSIS — K90829 Short bowel syndrome, unspecified: Secondary | ICD-10-CM

## 2017-08-26 LAB — COMPREHENSIVE METABOLIC PANEL
ALK PHOS: 92 U/L (ref 38–126)
ALT: 13 U/L — AB (ref 14–54)
ANION GAP: 9 (ref 5–15)
AST: 27 U/L (ref 15–41)
Albumin: 3.9 g/dL (ref 3.5–5.0)
BILIRUBIN TOTAL: 0.7 mg/dL (ref 0.3–1.2)
BUN: 51 mg/dL — AB (ref 6–20)
CALCIUM: 9.3 mg/dL (ref 8.9–10.3)
CO2: 20 mmol/L — ABNORMAL LOW (ref 22–32)
CREATININE: 2.85 mg/dL — AB (ref 0.44–1.00)
Chloride: 110 mmol/L (ref 101–111)
GFR calc non Af Amer: 16 mL/min — ABNORMAL LOW (ref >60)
GFR, EST AFRICAN AMERICAN: 18 mL/min — AB (ref >60)
Glucose, Bld: 99 mg/dL (ref 65–99)
Potassium: 4.4 mmol/L (ref 3.5–5.1)
Sodium: 139 mmol/L (ref 135–145)
Total Protein: 7.8 g/dL (ref 6.5–8.1)

## 2017-08-26 LAB — CBC
HCT: 35.9 % (ref 35.0–47.0)
HEMOGLOBIN: 11.2 g/dL — AB (ref 12.0–16.0)
MCH: 25.7 pg — AB (ref 26.0–34.0)
MCHC: 31.1 g/dL — ABNORMAL LOW (ref 32.0–36.0)
MCV: 82.7 fL (ref 80.0–100.0)
Platelets: 358 10*3/uL (ref 150–440)
RBC: 4.34 MIL/uL (ref 3.80–5.20)
RDW: 17.7 % — ABNORMAL HIGH (ref 11.5–14.5)
WBC: 7.4 10*3/uL (ref 3.6–11.0)

## 2017-08-26 LAB — TSH: TSH: 1.404 u[IU]/mL (ref 0.350–4.500)

## 2017-08-26 LAB — FERRITIN: FERRITIN: 17 ng/mL (ref 11–307)

## 2017-08-26 LAB — VITAMIN B12: Vitamin B-12: 115 pg/mL — ABNORMAL LOW (ref 180–914)

## 2017-08-26 LAB — FOLATE: FOLATE: 24 ng/mL (ref >5.9)

## 2017-08-26 LAB — PHOSPHORUS: PHOSPHORUS: 4.3 mg/dL (ref 2.5–4.6)

## 2017-08-26 LAB — MAGNESIUM: Magnesium: 1.5 mg/dL — ABNORMAL LOW (ref 1.7–2.4)

## 2017-08-29 ENCOUNTER — Telehealth: Payer: Self-pay | Admitting: Gastroenterology

## 2017-08-29 ENCOUNTER — Encounter: Payer: Self-pay | Admitting: Emergency Medicine

## 2017-08-29 ENCOUNTER — Emergency Department
Admission: EM | Admit: 2017-08-29 | Discharge: 2017-08-29 | Disposition: A | Discharge status: Home / Self Care | Payer: Medicare Other | Attending: Emergency Medicine | Admitting: Emergency Medicine

## 2017-08-29 DIAGNOSIS — Z8541 Personal history of malignant neoplasm of cervix uteri: Secondary | ICD-10-CM | POA: Insufficient documentation

## 2017-08-29 DIAGNOSIS — Z85038 Personal history of other malignant neoplasm of large intestine: Secondary | ICD-10-CM | POA: Diagnosis not present

## 2017-08-29 DIAGNOSIS — E860 Dehydration: Secondary | ICD-10-CM | POA: Insufficient documentation

## 2017-08-29 DIAGNOSIS — Z79899 Other long term (current) drug therapy: Secondary | ICD-10-CM | POA: Insufficient documentation

## 2017-08-29 DIAGNOSIS — Z7982 Long term (current) use of aspirin: Secondary | ICD-10-CM | POA: Diagnosis not present

## 2017-08-29 DIAGNOSIS — I110 Hypertensive heart disease with heart failure: Secondary | ICD-10-CM | POA: Insufficient documentation

## 2017-08-29 DIAGNOSIS — Z433 Encounter for attention to colostomy: Secondary | ICD-10-CM

## 2017-08-29 DIAGNOSIS — I509 Heart failure, unspecified: Secondary | ICD-10-CM | POA: Insufficient documentation

## 2017-08-29 LAB — COMPREHENSIVE METABOLIC PANEL
ALBUMIN: 4.2 g/dL (ref 3.5–5.0)
ALT: 15 U/L (ref 14–54)
AST: 31 U/L (ref 15–41)
Alkaline Phosphatase: 98 U/L (ref 38–126)
Anion gap: 8 (ref 5–15)
BILIRUBIN TOTAL: 0.4 mg/dL (ref 0.3–1.2)
BUN: 33 mg/dL — AB (ref 6–20)
CO2: 22 mmol/L (ref 22–32)
Calcium: 9.4 mg/dL (ref 8.9–10.3)
Chloride: 107 mmol/L (ref 101–111)
Creatinine, Ser: 1.73 mg/dL — ABNORMAL HIGH (ref 0.44–1.00)
GFR calc Af Amer: 33 mL/min — ABNORMAL LOW (ref >60)
GFR calc non Af Amer: 29 mL/min — ABNORMAL LOW (ref >60)
GLUCOSE: 122 mg/dL — AB (ref 65–99)
Potassium: 3.6 mmol/L (ref 3.5–5.1)
SODIUM: 137 mmol/L (ref 135–145)
TOTAL PROTEIN: 8.1 g/dL (ref 6.5–8.1)

## 2017-08-29 LAB — URINALYSIS, COMPLETE (UACMP) WITH MICROSCOPIC
Bilirubin Urine: NEGATIVE
Glucose, UA: NEGATIVE mg/dL
KETONES UR: NEGATIVE mg/dL
NITRITE: NEGATIVE
PROTEIN: 30 mg/dL — AB
Specific Gravity, Urine: 1.017 (ref 1.005–1.030)
pH: 5 (ref 5.0–8.0)

## 2017-08-29 LAB — CBC
HEMATOCRIT: 34 % — AB (ref 35.0–47.0)
Hemoglobin: 11 g/dL — ABNORMAL LOW (ref 12.0–16.0)
MCH: 26.1 pg (ref 26.0–34.0)
MCHC: 32.5 g/dL (ref 32.0–36.0)
MCV: 80.5 fL (ref 80.0–100.0)
Platelets: 410 10*3/uL (ref 150–440)
RBC: 4.23 MIL/uL (ref 3.80–5.20)
RDW: 17.3 % — AB (ref 11.5–14.5)
WBC: 8.3 10*3/uL (ref 3.6–11.0)

## 2017-08-29 LAB — LIPASE, BLOOD: Lipase: 55 U/L — ABNORMAL HIGH (ref 11–51)

## 2017-08-29 MED ORDER — SODIUM CHLORIDE 0.9 % IV BOLUS (SEPSIS)
1000.0000 mL | Freq: Once | INTRAVENOUS | Status: AC
  Administered 2017-08-29: 1000 mL via INTRAVENOUS

## 2017-08-29 MED ORDER — CHOLESTYRAMINE 4 G PO PACK
4.0000 g | PACK | Freq: Once | ORAL | Status: DC
  Filled 2017-08-29: qty 1

## 2017-08-29 MED ORDER — LOPERAMIDE HCL 2 MG PO CAPS
2.0000 mg | ORAL_CAPSULE | Freq: Once | ORAL | Status: AC
  Administered 2017-08-29: 2 mg via ORAL
  Filled 2017-08-29: qty 1

## 2017-08-29 NOTE — Telephone Encounter (Signed)
Patient left a voice message that she is returning a call.

## 2017-08-29 NOTE — ED Triage Notes (Signed)
Pt in via POV; sent over from her GI doctor due to dehydration.  Pt with chronic diarrhea, reports diarrhea has been more than normal x a couple of months.  NAD noted at this time.

## 2017-08-29 NOTE — Discharge Instructions (Signed)
It is safe to take loperamide up to 6 times a day.  Please make sure he remained well-hydrated and follow-up with your gastroenterologist tomorrow for recheck.  Return to the emergency department for any concerns whatsoever.  It was a pleasure to take care of you today, and thank you for coming to our emergency department.  If you have any questions or concerns before leaving please ask the nurse to grab me and I'm more than happy to go through your aftercare instructions again.  If you were prescribed any opioid pain medication today such as Norco, Vicodin, Percocet, morphine, hydrocodone, or oxycodone please make sure you do not drive when you are taking this medication as it can alter your ability to drive safely.  If you have any concerns once you are home that you are not improving or are in fact getting worse before you can make it to your follow-up appointment, please do not hesitate to call 911 and come back for further evaluation.  Darel Hong, MD  Results for orders placed or performed during the hospital encounter of 08/29/17  Lipase, blood  Result Value Ref Range   Lipase 55 (H) 11 - 51 U/L  Comprehensive metabolic panel  Result Value Ref Range   Sodium 137 135 - 145 mmol/L   Potassium 3.6 3.5 - 5.1 mmol/L   Chloride 107 101 - 111 mmol/L   CO2 22 22 - 32 mmol/L   Glucose, Bld 122 (H) 65 - 99 mg/dL   BUN 33 (H) 6 - 20 mg/dL   Creatinine, Ser 1.73 (H) 0.44 - 1.00 mg/dL   Calcium 9.4 8.9 - 10.3 mg/dL   Total Protein 8.1 6.5 - 8.1 g/dL   Albumin 4.2 3.5 - 5.0 g/dL   AST 31 15 - 41 U/L   ALT 15 14 - 54 U/L   Alkaline Phosphatase 98 38 - 126 U/L   Total Bilirubin 0.4 0.3 - 1.2 mg/dL   GFR calc non Af Amer 29 (L) >60 mL/min   GFR calc Af Amer 33 (L) >60 mL/min   Anion gap 8 5 - 15  CBC  Result Value Ref Range   WBC 8.3 3.6 - 11.0 K/uL   RBC 4.23 3.80 - 5.20 MIL/uL   Hemoglobin 11.0 (L) 12.0 - 16.0 g/dL   HCT 34.0 (L) 35.0 - 47.0 %   MCV 80.5 80.0 - 100.0 fL   MCH 26.1  26.0 - 34.0 pg   MCHC 32.5 32.0 - 36.0 g/dL   RDW 17.3 (H) 11.5 - 14.5 %   Platelets 410 150 - 440 K/uL  Urinalysis, Complete w Microscopic  Result Value Ref Range   Color, Urine YELLOW (A) YELLOW   APPearance TURBID (A) CLEAR   Specific Gravity, Urine 1.017 1.005 - 1.030   pH 5.0 5.0 - 8.0   Glucose, UA NEGATIVE NEGATIVE mg/dL   Hgb urine dipstick SMALL (A) NEGATIVE   Bilirubin Urine NEGATIVE NEGATIVE   Ketones, ur NEGATIVE NEGATIVE mg/dL   Protein, ur 30 (A) NEGATIVE mg/dL   Nitrite NEGATIVE NEGATIVE   Leukocytes, UA LARGE (A) NEGATIVE   RBC / HPF TOO NUMEROUS TO COUNT 0 - 5 RBC/hpf   WBC, UA TOO NUMEROUS TO COUNT 0 - 5 WBC/hpf   Bacteria, UA MANY (A) NONE SEEN   Squamous Epithelial / LPF TOO NUMEROUS TO COUNT (A) NONE SEEN   WBC Clumps PRESENT    Non Squamous Epithelial 6-30 (A) NONE SEEN   Mm Screening Breast Tomo Bilateral  Result Date: 08/11/2017 CLINICAL DATA:  Screening. EXAM: 2D DIGITAL SCREENING BILATERAL MAMMOGRAM WITH CAD AND ADJUNCT TOMO COMPARISON:  Previous exam(s). ACR Breast Density Category b: There are scattered areas of fibroglandular density. FINDINGS: There are no findings suspicious for malignancy. Images were processed with CAD. IMPRESSION: No mammographic evidence of malignancy. A result letter of this screening mammogram will be mailed directly to the patient. RECOMMENDATION: Screening mammogram in one year. (Code:SM-B-01Y) BI-RADS CATEGORY  1: Negative. Electronically Signed   By: Curlene Dolphin M.D.   On: 08/11/2017 13:20

## 2017-08-29 NOTE — ED Notes (Signed)
Pt presents today dehydration, pt PCP GI Clinic sent pt for dehydration. Pt is a/o and ambulatory. Pt is resting in bed.

## 2017-08-29 NOTE — ED Notes (Signed)
Pt states she is unable to provide urine specimen at this time, labeled cup provided for when she can.

## 2017-08-29 NOTE — ED Provider Notes (Signed)
[facility] Emergency Department Provider Note  ____________________________________________   First MD Initiated Contact with Patient 08/29/17 1815     (approximate)  I have reviewed the triage vital signs and the nursing notes.   HISTORY  Chief Complaint Dehydration   HPI Kendra Ritter is a 71 y.o. female who comes to the emergency department with dehydration.  She has a complex past medical history including previous hemicolectomy and end colostomy since the 90s.  She has been followed by gastroenterology for the past 2 months secondary to increased ostomy output.  She was seen by her gastroenterologist 3 days ago and she had acute kidney injury with a glomerular filtration rate down to 18.  She was advised to begin taking loperamide although she is only been taking it twice a day.  Today she called her gastroenterologist and told Dr. Marius Ditch that she was still feeling ill and that her ostomy output had only slightly decreased.  She denies fevers or chills.  She's in no pain.  Her symptoms have been insidious in onset and have been slowly improving.  She cannot tolerate cholestyramine.  Her symptoms do seem to be somewhat improved with loperamide.   Past Medical History:  Diagnosis Date  . Anemia   . BPPV (benign paroxysmal positional vertigo)   . CHF (congestive heart failure) ([place])   . Colon cancer ([place]) 1998  . GERD (gastroesophageal reflux disease)   . H/O aortic valve replacement   . Heart murmur   . Hx of cervical cancer   . Hypertension   . Stroke [facility])    multiple strokes, 2010, 2011, 2013     Patient Active Problem List   Diagnosis Date Noted  . 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 Transient cerebral ischemia   . TIA (transient ischemic attack) 08/21/2016  . Gastroesophageal reflux disease 09/22/2015  . History of CVA (cerebrovascular accident) 09/22/2015  . Sleep apnea 09/22/2015  . Chest pain 03/06/2014  . S/P MVR (mitral valve repair)  03/06/2014  . Murmur 03/06/2014  . SOB (shortness of breath) 03/06/2014  . HTN (hypertension) 03/06/2014    Past Surgical History:  Procedure Laterality Date  . AORTIC VALVE REPLACEMENT  2008   [facility]  . APPENDECTOMY    . CARDIAC CATHETERIZATION  2008   [facility]  . COLOSTOMY  1998   After colon cancer   . partial hysterectomy     cervical cancer    Prior to Admission medications   Medication Sig Start Date End Date Taking? Authorizing Provider  acetaminophen (TYLENOL) 325 MG tablet Take 650 mg by mouth. 12/30/16   [provider]  amLODipine (NORVASC) 10 MG tablet Take 1 tablet (10 mg total) by mouth daily. 04/10/14   Minna Merritts, MD  aspirin EC 81 MG EC tablet Take 1 tablet (81 mg total) by mouth daily. 08/24/16   Nicholes Mango, MD  carvedilol (COREG) 25 MG tablet Take 25 mg by mouth 2 (two) times daily with a meal.    [provider]  cholestyramine (QUESTRAN) 4 g packet Take 1 packet (4 g total) 2 (two) times daily by mouth. 08/23/17   Vanga, Tally Due, MD  cloNIDine (CATAPRES) 0.1 MG tablet  07/19/17   [provider]  ferrous sulfate 325 (65 FE) MG tablet Take 325 mg by mouth daily with breakfast.    [provider]  gabapentin (NEURONTIN) 300 MG capsule Take 300 mg by mouth 3 (three) times daily.  01/29/14   [provider]  latanoprost (XALATAN) 0.005 % ophthalmic solution Place 1 drop into both eyes at bedtime. 04/22/16   [provider]  losartan (COZAAR) 100 MG tablet Take 100 mg by mouth daily.    [provider]  meclizine (ANTIVERT) 25 MG tablet Take 1 tablet by mouth 1 day or 1 dose.    [provider]  omeprazole (PRILOSEC) 40 MG capsule  07/19/17   [provider]  ondansetron (ZOFRAN-ODT) 4 MG disintegrating tablet Take 4 mg by mouth. 01/20/17   [provider]  potassium chloride (KLOR-CON) 20 MEQ packet Take 1 packet by mouth 1 day or 1 dose. 09/28/16   [provider]    spironolactone (ALDACTONE) 25 MG tablet TAKE ONE TABLET BY MOUTH DAILY- TO REPLACE HCTZ 08/02/17   [provider]  triamcinolone ointment (KENALOG) 0.1 % APPLY TO AFFECTED AREA TWICE A DAY 05/18/17   [provider]    Allergies Lisinopril  Family History  Problem Relation Age of Onset  . Heart disease Mother   . Heart attack Mother   . Hypertension Mother   . Breast cancer Maternal Aunt     Social History Social History   Tobacco Use  . Smoking status: Never Smoker  . Smokeless tobacco: Never Used  Substance Use Topics  . Alcohol use: No  . Drug use: No    Review of Systems Constitutional: No fever/chills Eyes: No visual changes. ENT: No sore throat. Cardiovascular: Denies chest pain. Respiratory: Denies shortness of breath. Gastrointestinal: No abdominal pain.  No nausea, no vomiting.  Positive for diarrhea.  No constipation. Genitourinary: Negative for dysuria. Musculoskeletal: Negative for back pain. Skin: Negative for rash. Neurological: Negative for headaches, focal weakness or numbness.   ____________________________________________   PHYSICAL EXAM:  VITAL SIGNS: ED Triage Vitals  Enc Vitals Group     BP 08/29/17 1710 (!) 167/95     Pulse Rate 08/29/17 1710 83     Resp 08/29/17 1710 16     Temp 08/29/17 1710 99.3 F (37.4 C)     Temp Source 08/29/17 1710 Oral     SpO2 08/29/17 1710 100 %     Weight 08/29/17 1711 160 lb (72.6 kg)     Height 08/29/17 1711 5\' 1"  (1.549 m)     Head Circumference --      Peak Flow --      Pain Score 08/29/17 1710 7     Pain Loc --      Pain Edu? --      Excl. in [place]? --     Constitutional: Alert and oriented x 4 well appearing nontoxic appropriate cooperative speaks in full clear sentences no diaphoresis Eyes: PERRL EOMI. Head: Atraumatic. Nose: No congestion/rhinnorhea. Mouth/Throat: No trismus Neck: No stridor.   Cardiovascular: Normal rate, regular rhythm. Grossly normal heart sounds.  Good  peripheral circulation. Respiratory: Normal respiratory effort.  No retractions. Lungs CTAB and moving good air Gastrointestinal: Soft nondistended nontender no rebound or guarding no peritonitis colostomy in place pink patent productive Musculoskeletal: No lower extremity edema   Neurologic:  Normal speech and language. No gross focal neurologic deficits are appreciated. Skin:  Skin is warm, dry and intact. No rash noted. Psychiatric: Mood and affect are normal. Speech and behavior are normal.    ____________________________________________   DIFFERENTIAL includes but not limited to  Infectious diarrhea, high output ostomy, dehydration ____________________________________________   LABS (all labs ordered are listed, but only abnormal results are displayed)  Labs Reviewed  LIPASE, BLOOD - Abnormal; Notable for the following components:      Result Value   Lipase 55 (*)    All other components within normal limits  COMPREHENSIVE METABOLIC PANEL - Abnormal; Notable for the following components:   Glucose, Bld 122 (*)    BUN 33 (*)    Creatinine, Ser 1.73 (*)    GFR calc non Af Amer 29 (*)    GFR calc Af Amer 33 (*)    All other components within normal limits  CBC - Abnormal; Notable for the following components:   Hemoglobin 11.0 (*)    HCT 34.0 (*)    RDW 17.3 (*)    All other components within normal limits  URINALYSIS, COMPLETE (UACMP) WITH MICROSCOPIC - Abnormal; Notable for the following components:   Color, Urine YELLOW (*)    APPearance TURBID (*)    Hgb urine dipstick SMALL (*)    Protein, ur 30 (*)    Leukocytes, UA LARGE (*)    Bacteria, UA MANY (*)    Squamous Epithelial / LPF TOO NUMEROUS TO COUNT (*)    Non Squamous Epithelial 6-30 (*)    All other components within normal limits    Blood work reviewed by me shows improving renal  function __________________________________________  EKG   ____________________________________________  RADIOLOGY   ____________________________________________   PROCEDURES  Procedure(s) performed: no  Procedures  Critical Care performed: no  Observation: no ____________________________________________   INITIAL IMPRESSION / ASSESSMENT AND PLAN / ED COURSE  Pertinent labs & imaging results that were available during my care of the patient were reviewed by me and considered in my medical decision making (see chart for details).  The patient arrives very well-appearing hemodynamically stable with a benign abdomen.  I appreciate her acute kidney injury but it is actually significantly improved from 3 days ago.  The patient is only taking loperamide 2 times a day when it is safe to increase that up to at least 6.  Patient would prefer not to be admitted so I will reach out to gastroenterology to discuss.     ----------------------------------------- 7:18 PM on 08/29/2017 -----------------------------------------  I discussed the case with on-call gastroenterologist Dr. Allen Norris who agrees that if the patient feels improved and does not want to stay it is reasonable for her to be treated as an outpatient.  She is currently only taking loperamide twice a day and I instructed her that it is safe to take it up to 6 times a day.  I will reach out to Dr. Marius Ditch to help establish close follow up.  She verbalized understanding and agreement with the plan. ____________________________________________   FINAL CLINICAL IMPRESSION(S) / ED DIAGNOSES  Final diagnoses:  Dehydration  Encounter for attention to colostomy [facility])      NEW MEDICATIONS STARTED DURING THIS VISIT:  This SmartLink is deprecated. Use AVSMEDLIST instead to display the medication list for a patient.   Note:  This document was prepared using Dragon voice recognition software and may include unintentional  dictation errors.     Darel Hong, MD 08/30/17 [phone]

## 2017-08-29 NOTE — Telephone Encounter (Signed)
She is seen by me in clinic on Friday. Labs revealed AKI, B12 and iron def. AKI secondary to dehydration from high ostomy output. She feels tired  Advised her to go to ER today. She needs to be admitted for IVFs, and control high ostomy output. She agreed  I will see her tomorrow in the hospital  Cephas Darby, MD [address]  [place]  [place], [place] [address]  Main: [phone]  Fax: [phone] Pager: [phone]

## 2017-09-12 ENCOUNTER — Ambulatory Visit: Payer: Medicare Other | Admitting: Gastroenterology

## 2017-09-12 ENCOUNTER — Encounter: Payer: Self-pay | Admitting: Gastroenterology

## 2017-09-12 VITALS — BP 133/82 | HR 64 | Temp 98.1°F | Ht 63.0 in | Wt 163.4 lb

## 2017-09-12 DIAGNOSIS — K90829 Short bowel syndrome, unspecified: Secondary | ICD-10-CM

## 2017-09-12 DIAGNOSIS — K529 Noninfective gastroenteritis and colitis, unspecified: Secondary | ICD-10-CM | POA: Diagnosis not present

## 2017-09-12 DIAGNOSIS — K912 Postsurgical malabsorption, not elsewhere classified: Secondary | ICD-10-CM

## 2017-09-12 DIAGNOSIS — D513 Other dietary vitamin B12 deficiency anemia: Secondary | ICD-10-CM | POA: Diagnosis not present

## 2017-09-12 DIAGNOSIS — D508 Other iron deficiency anemias: Secondary | ICD-10-CM

## 2017-09-12 MED ORDER — FERROUS SULFATE 325 (65 FE) MG PO TBEC: 325.0000 mg | DELAYED_RELEASE_TABLET | Freq: Two times a day (BID) | ORAL | 1 refills | Status: DC

## 2017-09-12 MED ORDER — CYANOCOBALAMIN 1000 MCG/ML IJ SOLN: 1000.0000 ug | INTRAMUSCULAR | 0 refills | Status: DC

## 2017-09-12 NOTE — Progress Notes (Signed)
Cephas Darby, MD [address]  [place]  [place], [place] [address]  Main: [phone]  Fax: [phone]    Gastroenterology Consultation  Referring Provider:     Letta Median, MD Primary Care Physician:  Letta Median, MD Primary Gastroenterologist:  Dr. Cephas Darby Reason for Consultation:  High ostomy output        HPI:   Kendra Ritter is a 71 y.o. female referred by Dr. Letta Median, MD  for consultation & management of colonoscopy.  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  She reports having high ostomy output resulting in fatigue, lightheadedness.  She empties her ostomy bag every 1/2-2 hours and 3 times at night.  She reports the output is liquid brown.  She was suggested to take Imodium as needed which is not really helping.  She has not tried any fiber supplements or bile acid sequestrants.  She denies any weight loss, fever, chills, blood in stools, abdominal pain, nausea, vomiting.  Her last follow-up at [facility] with colorectal surgery was in 04/2017.  She was on TPN for short term postoperatively.  She also has a history of chronic normocytic anemia.  Her blood pressure is very high in clinic today and she did not take clonidine because she reports symptoms of lightheadedness after the medication.  She denies headache, chest pain, shortness of breath in clinic today.  Follow up visit 09/12/17: Since last visit, she was found to have AKI on labs, I told her to go to ER to get fluids and Cr improved. She did not tolerate cholestyramine as  she developed cramps. She increased imodium to 5times daily and her ostomy output has improved. Still emptying her bag 3 times at night and several times during day  NSAIDs: None  Antiplts/Anticoagulants/Anti thrombotics: None  GI Procedures: Colonoscopy at [facility] in 12/2016 via ostomy.  There is no evidence of Crohn's disease The mucosa appeared unremarkable.  The neo terminal ileum was normal A: Colon, biopsy  - Fragments of benign colonic mucosa - No changes suggestive of ischemic colitis identified - No active inflammation, viral cytopathic effect, granulomas, or dysplasia identified A: "Small bowel and colon containing fistula to bladder", resection - Multiple colonic adenomatous polyps, two contain high grade dysplasia; no carcinoma identified - Focal full thickness colonic wall disruption (clincal history of colovesical fistula) - Two intact anastomoses - Serosal adhesions - Distal margin has focal epithelial atypia that is indefinite for low grade dysplasia - Proximal margin is unremarkable - Fourteen histologically unremarkable lymph nodes have been examined - No granulomas or viral cytopathic effect identified  Past Medical History:  Diagnosis Date  . Anemia   . BPPV (benign paroxysmal positional vertigo)   . CHF (congestive heart failure) ([place])   . Colon cancer ([place]) 1998  . GERD (gastroesophageal reflux disease)   . H/O aortic valve replacement   . Heart murmur   . Hx of cervical cancer   . Hypertension   . Stroke [facility])    multiple strokes, 2010, 2011, 2013     Past Surgical History:  Procedure Laterality Date  . AORTIC VALVE REPLACEMENT  2008  [facility]  . APPENDECTOMY    . CARDIAC CATHETERIZATION  2008   [facility]  . COLOSTOMY  1998   After colon cancer   . partial hysterectomy     cervical cancer     Current Outpatient Medications:  .  acetaminophen (TYLENOL) 325 MG tablet, Take 650 mg by mouth., Disp: , Rfl:  .  amLODipine (NORVASC) 10 MG tablet, Take 1 tablet  (10 mg total) by mouth daily., Disp: 30 tablet, Rfl: 6 .  aspirin EC 81 MG EC tablet, Take 1 tablet (81 mg total) by mouth daily., Disp: , Rfl:  .  carvedilol (COREG) 25 MG tablet, Take 25 mg by mouth 2 (two) times daily with a meal., Disp: , Rfl:  .  cloNIDine (CATAPRES) 0.1 MG tablet, , Disp: , Rfl:  .  gabapentin (NEURONTIN) 300 MG capsule, Take 300 mg by mouth 3 (three) times daily. , Disp: , Rfl:  .  latanoprost (XALATAN) 0.005 % ophthalmic solution, Place 1 drop into both eyes at bedtime., Disp: , Rfl:  .  losartan (COZAAR) 100 MG tablet, Take 100 mg by mouth daily., Disp: , Rfl:  .  meclizine (ANTIVERT) 25 MG tablet, Take 1 tablet by mouth 1 day or 1 dose., Disp: , Rfl:  .  omeprazole (PRILOSEC) 40 MG capsule, , Disp: , Rfl:  .  ondansetron (ZOFRAN-ODT) 4 MG disintegrating tablet, Take 4 mg by mouth., Disp: , Rfl:  .  potassium chloride (KLOR-CON) 20 MEQ packet, Take 1 packet by mouth 1 day or 1 dose., Disp: , Rfl:  .  spironolactone (ALDACTONE) 25 MG tablet, TAKE ONE TABLET BY MOUTH DAILY- TO REPLACE HCTZ, Disp: , Rfl: 1 .  triamcinolone ointment (KENALOG) 0.1 %, APPLY TO AFFECTED AREA TWICE A DAY, Disp: , Rfl: 0 .  cyanocobalamin (,VITAMIN B-12,) 1000 MCG/ML injection, Inject 1 mL (1,000 mcg total) into the skin once a week for 10 doses. Weekly for 4weeks, then every 2weeks for 4weeks then monthly, Disp: 10 mL, Rfl: 0 .  ferrous sulfate 325 (65 FE) MG EC tablet, Take 1 tablet (325 mg total) by mouth 2 (two) times daily with a meal., Disp: 180 tablet, Rfl: 1   Family History  Problem Relation Age of Onset  . Heart disease Mother   . Heart attack Mother   . Hypertension Mother   . Breast cancer Maternal Aunt      Social History   Tobacco Use  . Smoking status: Never Smoker  . Smokeless tobacco: Never Used  Substance Use Topics  . Alcohol use: No  . Drug use: No    Allergies as of 09/12/2017 - Review Complete 09/12/2017  Allergen Reaction Noted  . Lisinopril Anaphylaxis and  Cough 04/10/2014    Review of Systems:    All systems reviewed and negative except where noted in HPI.   Physical Exam:  BP 133/82   Pulse 64   Temp 98.1 F (36.7 C) (Oral)   Ht 5\' 3"  (1.6 m)   Wt 163 lb 6.4 oz (74.1 kg)   BMI 28.95 kg/m  No LMP recorded. Patient has had a hysterectomy.  General:   Alert,  Well-developed, well-nourished, pleasant and cooperative in NAD Head:  Normocephalic and atraumatic. Eyes:  Sclera clear, no icterus.   Conjunctiva pink. Ears:  Normal auditory acuity. Nose:  No deformity, discharge, or lesions. Mouth:  No deformity or lesions,oropharynx pink & moist. Neck:  Supple; no masses or thyromegaly. Lungs:  Respirations even and unlabored.  Clear throughout  to auscultation.   No wheezes, crackles, or rhonchi. No acute distress. Heart:  Regular rate and rhythm; no murmurs, clicks, rubs, or gallops. Abdomen:  Normal bowel sounds. Soft, non-tender and distended, and colostomy in the left flank region, nontender, parastomal hernia, without masses, no hepatosplenomegaly noted.  No guarding or rebound tenderness.   Rectal: Nor performed Msk:  Symmetrical without gross deformities. Good, equal movement & strength bilaterally. Pulses:  Normal pulses noted. Extremities:  No clubbing or edema.  No cyanosis. Neurologic:  Alert and oriented x3;  grossly normal neurologically. Skin:  Intact without significant lesions or rashes. No jaundice. Lymph Nodes:  No significant cervical adenopathy. Psych:  Alert and cooperative. Normal mood and affect.  Imaging Studies: Reviewed  Assessment and Plan:   Kendra Ritter is a 71 y.o. female with history of rectal cancer status post left hemicolectomy, ileocolonic anastomosis, repair of enterovesicular fistula, fistula takedown, lysis of adhesions, moderate short bowel syndrome resulting in high ostomy output resulting in AKI which improved with IV fluids  High ostomy output: Colonoscopy normal - continue imodium 2mg   2pills every 6hrs - trial of Motofen, samples given - add fiber to increase bulk of stools  Normocytic anemia: secondary to iron and B12 deficiency - Start iron 325mg  BID with food - Start B12 subcut injections for 69months - Recheck labs in 61months  Follow up in 47months   Cephas Darby, MD

## 2017-10-17 ENCOUNTER — Other Ambulatory Visit: Payer: Self-pay

## 2017-10-17 DIAGNOSIS — D513 Other dietary vitamin B12 deficiency anemia: Secondary | ICD-10-CM

## 2017-10-17 MED ORDER — CYANOCOBALAMIN 1000 MCG/ML IJ SOLN: 1000.0000 ug | INTRAMUSCULAR | 0 refills | Status: DC

## 2017-11-17 ENCOUNTER — Other Ambulatory Visit: Payer: Self-pay

## 2017-11-17 DIAGNOSIS — D513 Other dietary vitamin B12 deficiency anemia: Secondary | ICD-10-CM

## 2017-11-17 MED ORDER — CYANOCOBALAMIN 1000 MCG/ML IJ SOLN: 1000.0000 ug | INTRAMUSCULAR | 0 refills | Status: DC

## 2018-03-07 ENCOUNTER — Telehealth: Payer: Self-pay

## 2018-03-07 NOTE — Telephone Encounter (Signed)
Please advise to which labs you would like patient have rechecked.  She called me Friday and stated that she has been feeling more tired than usual.  She has completed her B12 injections.  Please advise.  Thanks [employer]

## 2018-03-09 ENCOUNTER — Other Ambulatory Visit: Payer: Self-pay

## 2018-03-09 DIAGNOSIS — D508 Other iron deficiency anemias: Secondary | ICD-10-CM

## 2018-03-09 NOTE — Telephone Encounter (Signed)
Labs have been ordered and pt has bee notified and verbalized understanding

## 2018-03-22 ENCOUNTER — Other Ambulatory Visit
Admission: RE | Admit: 2018-03-22 | Discharge: 2018-03-22 | Disposition: A | Discharge status: Home / Self Care | Payer: Medicare HMO | Source: Ambulatory Visit | Attending: Gastroenterology | Admitting: Gastroenterology

## 2018-03-22 DIAGNOSIS — D508 Other iron deficiency anemias: Secondary | ICD-10-CM | POA: Insufficient documentation

## 2018-03-22 LAB — CBC WITH DIFFERENTIAL/PLATELET
Basophils Absolute: 0 K/uL (ref 0–0.1)
Basophils Relative: 1 %
Eosinophils Absolute: 0.1 K/uL (ref 0–0.7)
Eosinophils Relative: 1 %
HCT: 34.7 % — ABNORMAL LOW (ref 35.0–47.0)
Hemoglobin: 11.6 g/dL — ABNORMAL LOW (ref 12.0–16.0)
Lymphocytes Relative: 13 %
Lymphs Abs: 1 K/uL (ref 1.0–3.6)
MCH: 28.1 pg (ref 26.0–34.0)
MCHC: 33.5 g/dL (ref 32.0–36.0)
MCV: 83.9 fL (ref 80.0–100.0)
Monocytes Absolute: 0.6 K/uL (ref 0.2–0.9)
Monocytes Relative: 8 %
Neutro Abs: 6 K/uL (ref 1.4–6.5)
Neutrophils Relative %: 77 %
Platelets: 333 K/uL (ref 150–440)
RBC: 4.13 MIL/uL (ref 3.80–5.20)
RDW: 15.5 % — ABNORMAL HIGH (ref 11.5–14.5)
WBC: 7.7 K/uL (ref 3.6–11.0)

## 2018-03-22 LAB — IRON AND TIBC
Iron: 63 ug/dL (ref 28–170)
SATURATION RATIOS: 14 % (ref 10.4–31.8)
TIBC: 444 ug/dL (ref 250–450)
UIBC: 381 ug/dL

## 2018-03-22 LAB — VITAMIN B12: VITAMIN B 12: 213 pg/mL (ref 180–914)

## 2018-03-22 LAB — FOLATE: Folate: 16 ng/mL

## 2018-03-22 LAB — FERRITIN: Ferritin: 30 ng/mL (ref 11–307)

## 2018-03-27 ENCOUNTER — Telehealth: Payer: Self-pay | Admitting: Gastroenterology

## 2018-03-27 NOTE — Telephone Encounter (Signed)
Pt needs b12 shot send to pharamcy

## 2018-03-27 NOTE — Telephone Encounter (Signed)
Orders for pt B-12 shot has already been ordered and will be administered at office visit on Wednesday 03/29/18

## 2018-03-29 ENCOUNTER — Ambulatory Visit: Payer: Medicare Other | Admitting: Gastroenterology

## 2018-04-03 ENCOUNTER — Ambulatory Visit: Payer: Medicare HMO

## 2018-05-08 ENCOUNTER — Encounter: Payer: Self-pay | Admitting: Gastroenterology

## 2018-05-08 ENCOUNTER — Other Ambulatory Visit: Payer: Self-pay

## 2018-05-08 ENCOUNTER — Ambulatory Visit: Payer: Medicare HMO | Admitting: Gastroenterology

## 2018-05-08 VITALS — BP 186/122 | HR 84 | Resp 17 | Ht 63.0 in | Wt 159.6 lb

## 2018-05-08 DIAGNOSIS — K529 Noninfective gastroenteritis and colitis, unspecified: Secondary | ICD-10-CM

## 2018-05-08 DIAGNOSIS — Z9049 Acquired absence of other specified parts of digestive tract: Secondary | ICD-10-CM | POA: Diagnosis not present

## 2018-05-08 DIAGNOSIS — Z933 Colostomy status: Secondary | ICD-10-CM | POA: Diagnosis not present

## 2018-05-08 MED ORDER — DIPHENOXYLATE-ATROPINE 2.5-0.025 MG PO TABS
2.0000 | ORAL_TABLET | Freq: Four times a day (QID) | ORAL | 0 refills | Status: AC
Start: 2018-05-08 — End: 2018-05-22

## 2018-05-08 NOTE — Progress Notes (Signed)
Cephas Darby, MD [address]  [place]  [place], [place] [address]  Main: [phone]  Fax: [phone]    Gastroenterology Consultation  Referring Provider:     Letta Median, MD Primary Care Physician:  Letta Median, MD Primary Gastroenterologist:  Dr. Cephas Darby Reason for Consultation:  High ostomy output        HPI:   Kendra Ritter is a 72 y.o. female referred by Dr. Letta Median, MD  for consultation & management of colonoscopy.  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  She reports having high ostomy output resulting in fatigue, lightheadedness.  She empties her ostomy bag every 1/2-2 hours and 3 times at night.  She reports the output is liquid brown.  She was suggested to take Imodium as needed which is not really helping.  She has not tried any fiber supplements or bile acid sequestrants.  She denies any weight loss, fever, chills, blood in stools, abdominal pain, nausea, vomiting.  Her last follow-up at [facility] with colorectal surgery was in 04/2017.  She was on TPN for short term postoperatively.  She also has a history of chronic normocytic anemia.  Her blood pressure is very high in clinic today and she did not take clonidine because she reports symptoms of lightheadedness after the medication.  She denies headache, chest pain, shortness of breath in clinic today.  Follow up visit 09/12/17: Since last visit, she was found to have AKI on labs, I told her to go to ER to get fluids and Cr improved. She did not tolerate cholestyramine as  she developed cramps. She increased imodium to 5times daily and her ostomy output has improved. Still emptying her bag 3 times at night and several times during day  Follow-up visit 05/08/2018 She reports ongoing high ostomy output, watery brown output, emptying bag several times daily, feels fatigue and dehydrated. She is taking Imodium only twice daily. She does not recall if motofen helped. She is also here to receive B12 shot. Her most recent hemoglobin is stable, B12 levels are normal, so are iron studies and folate levels.  NSAIDs: None  Antiplts/Anticoagulants/Anti thrombotics: None  GI Procedures: Colonoscopy at [facility] in 12/2016 via ostomy.  There is no evidence of Crohn's disease The mucosa appeared unremarkable.  The neo terminal ileum was normal A: Colon, biopsy  - Fragments of benign colonic mucosa - No changes suggestive of ischemic colitis identified - No active inflammation, viral cytopathic effect, granulomas, or dysplasia identified A: "Small bowel and colon containing fistula to bladder", resection - Multiple colonic adenomatous polyps, two contain high grade dysplasia; no carcinoma identified - Focal full thickness colonic wall disruption (clincal history of colovesical fistula) - Two intact anastomoses - Serosal adhesions - Distal margin has focal epithelial atypia that is indefinite for low grade dysplasia - Proximal margin is unremarkable - Fourteen histologically unremarkable lymph nodes have been examined - No granulomas or viral cytopathic effect identified  Past Medical History:  Diagnosis Date  . Anemia   . BPPV (benign paroxysmal positional vertigo)   . CHF (congestive heart failure) ([place])   . Colon cancer ([place]) 1998  .  GERD (gastroesophageal reflux disease)   . H/O aortic valve replacement   . Heart murmur   . Hx of cervical cancer   . Hypertension   . Stroke [facility])    multiple strokes, 2010, 2011, 2013     Past Surgical History:  Procedure Laterality  Date  . AORTIC VALVE REPLACEMENT  2008   [facility]  . APPENDECTOMY    . CARDIAC CATHETERIZATION  2008   [facility]  . COLOSTOMY  1998   After colon cancer   . partial hysterectomy     cervical cancer     Current Outpatient Medications:  .  acetaminophen (TYLENOL) 325 MG tablet, Take 650 mg by mouth., Disp: , Rfl:  .  amLODipine (NORVASC) 10 MG tablet, Take 1 tablet (10 mg total) by mouth daily., Disp: 30 tablet, Rfl: 6 .  aspirin EC 81 MG EC tablet, Take 1 tablet (81 mg total) by mouth daily., Disp: , Rfl:  .  carvedilol (COREG) 25 MG tablet, Take 25 mg by mouth 2 (two) times daily with a meal., Disp: , Rfl:  .  cloNIDine (CATAPRES) 0.1 MG tablet, , Disp: , Rfl:  .  cyanocobalamin (,VITAMIN B-12,) 1000 MCG/ML injection, INJECT 1 ML (1,000 MCG TOTAL) INTO THE SKIN EVERY 30 (THIRTY) DAYS., Disp: , Rfl: 0 .  gabapentin (NEURONTIN) 300 MG capsule, Take 300 mg by mouth 3 (three) times daily. , Disp: , Rfl:  .  latanoprost (XALATAN) 0.005 % ophthalmic solution, Place 1 drop into both eyes at bedtime., Disp: , Rfl:  .  meclizine (ANTIVERT) 25 MG tablet, Take 1 tablet by mouth 1 day or 1 dose., Disp: , Rfl:  .  omeprazole (PRILOSEC) 40 MG capsule, , Disp: , Rfl:  .  ondansetron (ZOFRAN-ODT) 4 MG disintegrating tablet, Take 4 mg by mouth., Disp: , Rfl:  .  potassium chloride (KLOR-CON) 20 MEQ packet, Take 1 packet by mouth 1 day or 1 dose., Disp: , Rfl:  .  spironolactone (ALDACTONE) 25 MG tablet, TAKE ONE TABLET BY MOUTH DAILY- TO REPLACE HCTZ, Disp: , Rfl: 1 .  triamcinolone ointment (KENALOG) 0.1 %, APPLY TO AFFECTED AREA TWICE A DAY, Disp: , Rfl: 0 .  diphenoxylate-atropine (LOMOTIL) 2.5-0.025 MG tablet, Take 2 tablets by mouth 4 (four) times daily for 14 days., Disp: 112 tablet, Rfl: 0 .  ferrous sulfate 325 (65 FE) MG EC tablet, Take 1 tablet (325 mg total) by mouth 2 (two) times daily with a meal., Disp: 180 tablet, Rfl: 1 .  losartan (COZAAR) 100 MG tablet, Take 100 mg by mouth daily., Disp: , Rfl:   .  omeprazole (PRILOSEC) 20 MG capsule, Take by mouth., Disp: , Rfl:    Family History  Problem Relation Age of Onset  . Heart disease Mother   . Heart attack Mother   . Hypertension Mother   . Breast cancer Maternal Aunt      Social History   Tobacco Use  . Smoking status: Never Smoker  . Smokeless tobacco: Never Used  Substance Use Topics  . Alcohol use: No  . Drug use: No    Allergies as of 05/08/2018 - Review Complete 05/08/2018  Allergen Reaction Noted  . Lisinopril Anaphylaxis and Cough 04/10/2014    Review of Systems:    All systems reviewed and negative except where noted in HPI.   Physical Exam:  BP (!) 186/122 (BP Location: Left Arm, Patient Position: Sitting, Cuff Size: Large)   Pulse 84   Resp 17   Ht  5\' 3"  (1.6 m)   Wt 159 lb 9.6 oz (72.4 kg)   BMI 28.27 kg/m  No LMP recorded. Patient has had a hysterectomy.  General:   Alert,  Well-developed, well-nourished, pleasant and cooperative in NAD Head:  Normocephalic and atraumatic. Eyes:  Sclera clear, no icterus.   Conjunctiva pink. Ears:  Normal auditory acuity. Nose:  No deformity, discharge, or lesions. Mouth:  No deformity or lesions,oropharynx pink & moist. Neck:  Supple; no masses or thyromegaly. Lungs:  Respirations even and unlabored.  Clear throughout to auscultation.   No wheezes, crackles, or rhonchi. No acute distress. Heart:  Regular rate and rhythm; no murmurs, clicks, rubs, or gallops. Abdomen:  Normal bowel sounds. Soft, non-tender and distended, and colostomy in the left flank region, nontender, parastomal hernia, without masses, no hepatosplenomegaly noted.  No guarding or rebound tenderness.   Rectal: Nor performed Msk:  Symmetrical without gross deformities. Good, equal movement & strength bilaterally. Pulses:  Normal pulses noted. Extremities:  No clubbing or edema.  No cyanosis. Neurologic:  Alert and oriented x3;  grossly normal neurologically. Skin:  Intact without significant  lesions or rashes. No jaundice. Lymph Nodes:  No significant cervical adenopathy. Psych:  Alert and cooperative. Normal mood and affect.  Imaging Studies: Reviewed  Assessment and Plan:   Kendra Ritter is a 72 y.o. 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

## 2018-05-08 NOTE — Progress Notes (Signed)
B-12 injection given in clinic today Left deltoid  NDC# 67227-737-50 Lot # [id number] Exp Date: 11/2019

## 2018-05-09 ENCOUNTER — Other Ambulatory Visit
Admission: RE | Admit: 2018-05-09 | Discharge: 2018-05-09 | Disposition: A | Discharge status: Home / Self Care | Payer: Medicare HMO | Source: Ambulatory Visit | Attending: Gastroenterology | Admitting: Gastroenterology

## 2018-05-09 DIAGNOSIS — Z933 Colostomy status: Secondary | ICD-10-CM | POA: Diagnosis present

## 2018-05-09 LAB — BASIC METABOLIC PANEL
Anion gap: 8 (ref 5–15)
BUN: 21 mg/dL (ref 8–23)
CALCIUM: 8.9 mg/dL (ref 8.9–10.3)
CO2: 24 mmol/L (ref 22–32)
CREATININE: 1.03 mg/dL — AB (ref 0.44–1.00)
Chloride: 110 mmol/L (ref 98–111)
GFR, EST NON AFRICAN AMERICAN: 54 mL/min — AB (ref >60)
Glucose, Bld: 94 mg/dL (ref 70–99)
Potassium: 3.2 mmol/L — ABNORMAL LOW (ref 3.5–5.1)
Sodium: 142 mmol/L (ref 135–145)

## 2018-05-09 LAB — C DIFFICILE QUICK SCREEN W PCR REFLEX
C Diff antigen: NEGATIVE
C Diff interpretation: NOT DETECTED
C Diff toxin: NEGATIVE

## 2018-05-09 LAB — MAGNESIUM: Magnesium: 1.7 mg/dL (ref 1.7–2.4)

## 2018-05-11 LAB — PANCREATIC ELASTASE, FECAL: Pancreatic Elastase-1, Stool: >500 ug Elast./g (ref >200)

## 2018-05-31 ENCOUNTER — Ambulatory Visit: Payer: Medicare HMO | Admitting: Gastroenterology

## 2018-05-31 ENCOUNTER — Other Ambulatory Visit: Payer: Self-pay

## 2018-05-31 ENCOUNTER — Encounter: Payer: Self-pay | Admitting: Gastroenterology

## 2018-05-31 VITALS — BP 161/92 | HR 78 | Resp 17 | Ht 63.0 in | Wt 157.0 lb

## 2018-05-31 DIAGNOSIS — R197 Diarrhea, unspecified: Secondary | ICD-10-CM

## 2018-05-31 DIAGNOSIS — K9419 Other complications of enterostomy: Secondary | ICD-10-CM | POA: Diagnosis not present

## 2018-05-31 DIAGNOSIS — D508 Other iron deficiency anemias: Secondary | ICD-10-CM | POA: Diagnosis not present

## 2018-05-31 DIAGNOSIS — K909 Intestinal malabsorption, unspecified: Secondary | ICD-10-CM

## 2018-05-31 MED ORDER — DIPHENOXYLATE-ATROPINE 2.5-0.025 MG PO TABS: 2.0000 | ORAL_TABLET | Freq: Four times a day (QID) | ORAL | 0 refills | Status: AC

## 2018-05-31 MED ORDER — CYANOCOBALAMIN 1000 MCG/ML IJ SOLN
1000.0000 ug | Freq: Once | INTRAMUSCULAR | Status: AC
  Administered 2018-05-31: 1000 ug via INTRAMUSCULAR

## 2018-05-31 NOTE — Progress Notes (Signed)
Cephas Darby, MD [address]  [place]  [place], [place] [address]  Main: [phone]  Fax: [phone]    Gastroenterology Consultation  Referring Provider:     Letta Median, MD Primary Care Physician:  Letta Median, MD Primary Gastroenterologist:  Dr. Cephas Darby Reason for Consultation:  High ostomy output        HPI:   Kendra Ritter is a 72 y.o. female referred by Dr. Letta Median, MD  for consultation & management of colonoscopy.  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  She reports having high ostomy output resulting in fatigue, lightheadedness.  She empties her ostomy bag every 1/2-2 hours and 3 times at night.  She reports the output is liquid brown.  She was suggested to take Imodium as needed which is not really helping.  She has not tried any fiber supplements or bile acid sequestrants.  She denies any weight loss, fever, chills, blood in stools, abdominal pain, nausea, vomiting.  Her last follow-up at [facility] with colorectal surgery was in 04/2017.  She was on TPN for short term postoperatively.  She also has a history of chronic normocytic anemia.  Her blood pressure is very high in clinic today and she did not take clonidine because she reports symptoms of lightheadedness after the medication.  She denies headache, chest pain, shortness of breath in clinic today.  Follow up visit 09/12/17: Since last visit, she was found to have AKI on labs, I told her to go to ER to get fluids and Cr improved. She did not tolerate cholestyramine as  she developed cramps. She increased imodium to 5times daily and her ostomy output has improved. Still emptying her bag 3 times at night and several times during day  Follow-up visit 05/08/2018 She reports ongoing high ostomy output, watery brown output, emptying bag several times daily, feels fatigue and dehydrated. She is taking Imodium only twice daily. She does not recall if motofen helped. She is also here to receive B12 shot. Her most recent hemoglobin is stable, B12 levels are normal, so are iron studies and folate levels.  Follow-up visit 05/31/2018 Overall, her ostomy output has improved, the stools are applesauce consistency, brown and she is emptying the bag only 3 times in a day. And, not waking up at night to empty her bag. She is currently on Lomotil 2 tablets 4 times daily along with Imodium 1 tablet 4 times daily. Pancreatic fecal elastase levels normal, C. Difficile was negative.she continues to take B12 injections. Her weight has been stable. Her kidney function is improving  NSAIDs: None  Antiplts/Anticoagulants/Anti thrombotics: None  GI Procedures: Colonoscopy at [facility] in 12/2016 via ostomy.  There is no evidence of Crohn's disease The mucosa appeared unremarkable.  The neo terminal ileum was normal A: Colon, biopsy  - Fragments of benign colonic mucosa - No changes suggestive of ischemic colitis identified - No active inflammation, viral cytopathic effect, granulomas, or dysplasia identified A: "Small bowel and colon containing fistula to bladder", resection - Multiple colonic adenomatous polyps, two contain high grade dysplasia; no carcinoma identified - Focal full thickness colonic wall disruption (clincal  history of colovesical fistula) - Two intact anastomoses - Serosal adhesions - Distal margin has focal epithelial atypia that is indefinite for low grade dysplasia - Proximal margin is unremarkable - Fourteen histologically unremarkable lymph nodes have been examined -  No granulomas or viral cytopathic effect identified  Past Medical History:  Diagnosis Date  . Anemia   . BPPV (benign paroxysmal positional vertigo)   . CHF (congestive heart failure) ([place])   . Colon cancer ([place]) 1998  . GERD (gastroesophageal reflux disease)   . H/O aortic valve replacement   . Heart murmur   . Hx of cervical cancer   . Hypertension   . Stroke [facility])    multiple strokes, 2010, 2011, 2013     Past Surgical History:  Procedure Laterality Date  . AORTIC VALVE REPLACEMENT  2008   [facility]  . APPENDECTOMY    . CARDIAC CATHETERIZATION  2008   [facility]  . COLOSTOMY  1998   After colon cancer   . partial hysterectomy     cervical cancer     Current Outpatient Medications:  .  acetaminophen (TYLENOL) 325 MG tablet, Take 650 mg by mouth., Disp: , Rfl:  .  amLODipine (NORVASC) 10 MG tablet, Take 1 tablet (10 mg total) by mouth daily., Disp: 30 tablet, Rfl: 6 .  aspirin EC 81 MG EC tablet, Take 1 tablet (81 mg total) by mouth daily., Disp: , Rfl:  .  carvedilol (COREG) 25 MG tablet, Take 25 mg by mouth 2 (two) times daily with a meal., Disp: , Rfl:  .  cloNIDine (CATAPRES) 0.1 MG tablet, , Disp: , Rfl:  .  cyanocobalamin (,VITAMIN B-12,) 1000 MCG/ML injection, INJECT 1 ML (1,000 MCG TOTAL) INTO THE SKIN EVERY 30 (THIRTY) DAYS., Disp: , Rfl: 0 .  gabapentin (NEURONTIN) 300 MG capsule, Take 300 mg by mouth 3 (three) times daily. , Disp: , Rfl:  .  hydrochlorothiazide (HYDRODIURIL) 25 MG tablet, Take by mouth., Disp: , Rfl:  .  latanoprost (XALATAN) 0.005 % ophthalmic solution, Place 1 drop into both eyes at bedtime., Disp: , Rfl:  .  losartan (COZAAR) 100 MG tablet, Take 100 mg by mouth daily., Disp: , Rfl:  .  meclizine (ANTIVERT) 25 MG tablet, Take 1 tablet by mouth 1 day or 1 dose., Disp: , Rfl:  .  omeprazole (PRILOSEC) 20 MG capsule, Take by mouth., Disp: , Rfl:  .  potassium chloride (KLOR-CON) 20 MEQ packet, Take 1 packet by mouth 1 day or 1 dose., Disp: , Rfl:  .   promethazine (PHENERGAN) 25 MG tablet, Take 1/2 to 1 tablet every 8hrs for nausea., Disp: , Rfl:  .  spironolactone (ALDACTONE) 25 MG tablet, TAKE ONE TABLET BY MOUTH DAILY- TO REPLACE HCTZ, Disp: , Rfl: 1 .  triamcinolone ointment (KENALOG) 0.1 %, APPLY TO AFFECTED AREA TWICE A DAY, Disp: , Rfl: 0 .  diphenoxylate-atropine (LOMOTIL) 2.5-0.025 MG tablet, Take 2 tablets by mouth 4 (four) times daily., Disp: 720 tablet, Rfl: 0 .  ferrous sulfate 325 (65 FE) MG EC tablet, Take 1 tablet (325 mg total) by mouth 2 (two) times daily with a meal., Disp: 180 tablet, Rfl: 1 .  ondansetron (ZOFRAN-ODT) 4 MG disintegrating tablet, Take 4 mg by mouth., Disp: , Rfl:    Family History  Problem Relation Age of Onset  . Heart disease Mother   . Heart attack Mother   . Hypertension Mother   . Breast cancer Maternal Aunt      Social  History   Tobacco Use  . Smoking status: Never Smoker  . Smokeless tobacco: Never Used  Substance Use Topics  . Alcohol use: No  . Drug use: No    Allergies as of 05/31/2018 - Review Complete 05/31/2018  Allergen Reaction Noted  . Lisinopril Anaphylaxis and Cough 04/10/2014    Review of Systems:    All systems reviewed and negative except where noted in HPI.   Physical Exam:  BP (!) 161/92 (BP Location: Left Arm, Patient Position: Sitting, Cuff Size: Large)   Pulse 78   Resp 17   Ht 5\' 3"  (1.6 m)   Wt 157 lb (71.2 kg)   BMI 27.81 kg/m  No LMP recorded. Patient has had a hysterectomy.  General:   Alert,  Well-developed, well-nourished, pleasant and cooperative in NAD Head:  Normocephalic and atraumatic. Eyes:  Sclera clear, no icterus.   Conjunctiva pink. Ears:  Normal auditory acuity. Nose:  No deformity, discharge, or lesions. Mouth:  No deformity or lesions,oropharynx pink & moist. Neck:  Supple; no masses or thyromegaly. Lungs:  Respirations even and unlabored.  Clear throughout to auscultation.   No wheezes, crackles, or rhonchi. No acute  distress. Heart:  Regular rate and rhythm; no murmurs, clicks, rubs, or gallops. Abdomen:  Normal bowel sounds. Soft, non-tender and distended, and colostomy in the left flank region, nontender, parastomal hernia, without masses, no hepatosplenomegaly noted.  No guarding or rebound tenderness.   Rectal: Nor performed Msk:  Symmetrical without gross deformities. Good, equal movement & strength bilaterally. Pulses:  Normal pulses noted. Extremities:  No clubbing or edema.  No cyanosis. Neurologic:  Alert and oriented x3;  grossly normal neurologically. Skin:  Intact without significant lesions or rashes. No jaundice. Lymph Nodes:  No significant cervical adenopathy. Psych:  Alert and cooperative. Normal mood and affect.  Imaging Studies: Reviewed  Assessment and Plan:   Kendra Ritter is a 72 y.o. 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

## 2018-06-23 ENCOUNTER — Other Ambulatory Visit: Payer: Self-pay | Admitting: Gastroenterology

## 2018-06-23 DIAGNOSIS — D513 Other dietary vitamin B12 deficiency anemia: Secondary | ICD-10-CM

## 2018-08-13 ENCOUNTER — Other Ambulatory Visit: Payer: Self-pay | Admitting: Gastroenterology

## 2018-08-23 ENCOUNTER — Other Ambulatory Visit: Payer: Self-pay | Admitting: Internal Medicine

## 2018-08-23 DIAGNOSIS — Z1231 Encounter for screening mammogram for malignant neoplasm of breast: Secondary | ICD-10-CM

## 2018-09-01 ENCOUNTER — Ambulatory Visit: Payer: Medicare HMO | Admitting: Gastroenterology

## 2018-09-11 DIAGNOSIS — H179 Unspecified corneal scar and opacity: Secondary | ICD-10-CM | POA: Insufficient documentation

## 2018-09-18 ENCOUNTER — Other Ambulatory Visit: Payer: Self-pay

## 2018-09-18 ENCOUNTER — Ambulatory Visit: Payer: Medicare HMO | Admitting: Gastroenterology

## 2018-09-18 ENCOUNTER — Encounter: Payer: Self-pay | Admitting: Gastroenterology

## 2018-09-18 VITALS — BP 173/95 | HR 70 | Resp 16 | Ht 63.0 in | Wt 161.4 lb

## 2018-09-18 DIAGNOSIS — D508 Other iron deficiency anemias: Secondary | ICD-10-CM

## 2018-09-18 DIAGNOSIS — D518 Other vitamin B12 deficiency anemias: Secondary | ICD-10-CM

## 2018-09-18 DIAGNOSIS — Z933 Colostomy status: Secondary | ICD-10-CM

## 2018-09-18 NOTE — Progress Notes (Signed)
Cephas Darby, MD [address]  [place]  [place], [place] [address]  Main: [phone]  Fax: [phone]    Gastroenterology Consultation  Referring Provider:     Letta Median, MD Primary Care Physician:  Letta Median, MD Primary Gastroenterologist:  Dr. Cephas Darby Reason for Consultation:  High ostomy output        HPI:   Kendra Ritter is a 72 y.o. female referred by Dr. Letta Median, MD  for consultation & management of colonoscopy.  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  She reports having high ostomy output resulting in fatigue, lightheadedness.  She empties her ostomy bag every 1/2-2 hours and 3 times at night.  She reports the output is liquid brown.  She was suggested to take Imodium as needed which is not really helping.  She has not tried any fiber supplements or bile acid sequestrants.  She denies any weight loss, fever, chills, blood in stools, abdominal pain, nausea, vomiting.  Her last follow-up at [facility] with colorectal surgery was in 04/2017.  She was on TPN for short term postoperatively.  She also has a history of chronic normocytic anemia.  Her blood pressure is very high in clinic today and she did not take clonidine because she reports symptoms of lightheadedness after the medication.  She denies headache, chest pain, shortness of breath in clinic today.  Follow up visit 09/12/17: Since last visit, she was found to have AKI on labs, I told her to go to ER to get fluids and Cr improved. She did not tolerate cholestyramine as  she developed cramps. She increased imodium to 5times daily and her ostomy output has improved. Still emptying her bag 3 times at night and several times during day  Follow-up visit 05/08/2018 She reports ongoing high ostomy output, watery brown output, emptying bag several times daily, feels fatigue and dehydrated. She is taking Imodium only twice daily. She does not recall if motofen helped. She is also here to receive B12 shot. Her most recent hemoglobin is stable, B12 levels are normal, so are iron studies and folate levels.  Follow-up visit 05/31/2018 Overall, her ostomy output has improved, the stools are applesauce consistency, brown and she is emptying the bag only 3 times in a day. And, not waking up at night to empty her bag. She is currently on Lomotil 2 tablets 4 times daily along with Imodium 1 tablet 4 times daily. Pancreatic fecal elastase levels normal, C. Difficile was negative.she continues to take B12 injections. Her weight has been stable. Her kidney function is improving  Follow-up visit 09/18/2018 She denies any complaints today.  She reports having moderate ostomy output, emptying bag about 3 times a day. Stool consistency varies between liquid and soft but mostly liquid.  She is taking oral iron as well as oral B12.  She is taking Imodium as well as Lomotil 4 times a day  NSAIDs: None  Antiplts/Anticoagulants/Anti thrombotics: None  GI Procedures: Colonoscopy at [facility] in 12/2016 via ostomy.  There is no evidence of Crohn's disease The mucosa appeared unremarkable.  The neo terminal ileum was normal A: Colon, biopsy  -  Fragments of benign colonic mucosa - No changes suggestive of ischemic colitis identified - No active inflammation, viral cytopathic effect, granulomas, or dysplasia identified A: "Small bowel and colon containing fistula to bladder", resection - Multiple colonic adenomatous polyps, two contain high grade dysplasia; no carcinoma identified - Focal full  thickness colonic wall disruption (clincal history of colovesical fistula) - Two intact anastomoses - Serosal adhesions - Distal margin has focal epithelial atypia that is indefinite for low grade dysplasia - Proximal margin is unremarkable - Fourteen histologically unremarkable lymph nodes have been examined - No granulomas or viral cytopathic effect identified  Past Medical History:  Diagnosis Date  . Anemia   . BPPV (benign paroxysmal positional vertigo)   . CHF (congestive heart failure) ([place])   . Colon cancer ([place]) 1998  . GERD (gastroesophageal reflux disease)   . H/O aortic valve replacement   . Heart murmur   . Hx of cervical cancer   . Hypertension   . Stroke [facility])    multiple strokes, 2010, 2011, 2013     Past Surgical History:  Procedure Laterality Date  . AORTIC VALVE REPLACEMENT  2008   [facility]  . APPENDECTOMY    . CARDIAC CATHETERIZATION  2008   [facility]  . COLOSTOMY  1998   After colon cancer   . partial hysterectomy     cervical cancer     Current Outpatient Medications:  .  acetaminophen (TYLENOL) 325 MG tablet, Take 650 mg by mouth., Disp: , Rfl:  .  amLODipine (NORVASC) 10 MG tablet, Take 1 tablet (10 mg total) by mouth daily., Disp: 30 tablet, Rfl: 6 .  aspirin EC 81 MG EC tablet, Take 1 tablet (81 mg total) by mouth daily., Disp: , Rfl:  .  carvedilol (COREG) 25 MG tablet, Take 25 mg by mouth 2 (two) times daily with a meal., Disp: , Rfl:  .  cloNIDine (CATAPRES) 0.1 MG tablet, , Disp: , Rfl:  .  ferrous sulfate 325 (65 FE) MG tablet, TAKE 1 TABLET (325 MG TOTAL) BY MOUTH 2 (TWO) TIMES DAILY WITH A MEAL., Disp: 180 tablet, Rfl: 1 .  gabapentin (NEURONTIN) 300 MG capsule, Take 300 mg by mouth 3 (three) times daily. , Disp: , Rfl:  .  hydrALAZINE (APRESOLINE) 25 MG tablet, Take 25 mg by mouth 3 (three) times daily., Disp: , Rfl: 1 .  hydrochlorothiazide (HYDRODIURIL) 25 MG tablet, Take by mouth., Disp: , Rfl:  .  latanoprost (XALATAN) 0.005 % ophthalmic  solution, Place 1 drop into both eyes at bedtime., Disp: , Rfl:  .  losartan (COZAAR) 100 MG tablet, Take 100 mg by mouth daily., Disp: , Rfl:  .  omeprazole (PRILOSEC) 20 MG capsule, Take by mouth., Disp: , Rfl:  .  potassium chloride (KLOR-CON) 20 MEQ packet, Take 1 packet by mouth 1 day or 1 dose., Disp: , Rfl:  .  promethazine (PHENERGAN) 25 MG tablet, Take 1/2 to 1 tablet every 8hrs for nausea., Disp: , Rfl:  .  spironolactone (ALDACTONE) 25 MG tablet, TAKE ONE TABLET BY MOUTH DAILY- TO REPLACE HCTZ, Disp: , Rfl: 1 .  triamcinolone ointment (KENALOG) 0.1 %, APPLY TO AFFECTED AREA TWICE A DAY, Disp: , Rfl: 0 .  cyanocobalamin (,VITAMIN B-12,) 1000 MCG/ML injection, INJECT 1 ML (1,000 MCG TOTAL) INTO THE SKIN EVERY 30 (THIRTY) DAYS., Disp: , Rfl: 0 .  cyanocobalamin (,VITAMIN B-12,) 1000 MCG/ML injection, INJECT 1 ML (1,000 MCG TOTAL) INTO THE SKIN EVERY 30 (THIRTY) DAYS. (Patient not taking:  Reported on 09/18/2018), Disp: 1 mL, Rfl: 2 .  meclizine (ANTIVERT) 25 MG tablet, Take 1 tablet by mouth 1 day or 1 dose., Disp: , Rfl:  .  ondansetron (ZOFRAN-ODT) 4 MG disintegrating tablet, Take 4 mg by mouth., Disp: , Rfl:    Family History  Problem Relation Age of Onset  . Heart disease Mother   . Heart attack Mother   . Hypertension Mother   . Breast cancer Maternal Aunt      Social History   Tobacco Use  . Smoking status: Never Smoker  . Smokeless tobacco: Never Used  Substance Use Topics  . Alcohol use: No  . Drug use: No    Allergies as of 09/18/2018 - Review Complete 09/18/2018  Allergen Reaction Noted  . Lisinopril Anaphylaxis and Cough 04/10/2014    Review of Systems:    All systems reviewed and negative except where noted in HPI.   Physical Exam:  BP (!) 173/95 (BP Location: Left Arm, Patient Position: Sitting, Cuff Size: Large)   Pulse 70   Resp 16   Ht 5\' 3"  (1.6 m)   Wt 161 lb 6.4 oz (73.2 kg)   BMI 28.59 kg/m  No LMP recorded. Patient has had a  hysterectomy.  General:   Alert,  Well-developed, well-nourished, pleasant and cooperative in NAD Head:  Normocephalic and atraumatic. Eyes:  Sclera clear, no icterus.   Conjunctiva pink. Ears:  Normal auditory acuity. Nose:  No deformity, discharge, or lesions. Mouth:  No deformity or lesions,oropharynx pink & moist. Neck:  Supple; no masses or thyromegaly. Lungs:  Respirations even and unlabored.  Clear throughout to auscultation.   No wheezes, crackles, or rhonchi. No acute distress. Heart:  Regular rate and rhythm; no murmurs, clicks, rubs, or gallops. Abdomen:  Normal bowel sounds. Soft, non-tender and distended, and colostomy in the left flank region, nontender, parastomal hernia, without masses, no hepatosplenomegaly noted.  No guarding or rebound tenderness.   Rectal: Nor performed Msk:  Symmetrical without gross deformities. Good, equal movement & strength bilaterally. Pulses:  Normal pulses noted. Extremities:  No clubbing or edema.  No cyanosis. Neurologic:  Alert and oriented x3;  grossly normal neurologically. Skin:  Intact without significant lesions or rashes. No jaundice Psych:  Alert and cooperative. Normal mood and affect.   Imaging Studies: Reviewed  Assessment and Plan:   Kendra Ritter is a 72 y.o. 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

## 2018-09-19 ENCOUNTER — Other Ambulatory Visit: Payer: Self-pay | Admitting: Gastroenterology

## 2018-09-19 DIAGNOSIS — E559 Vitamin D deficiency, unspecified: Secondary | ICD-10-CM

## 2018-09-19 LAB — BASIC METABOLIC PANEL
BUN/Creatinine Ratio: 19 (ref 12–28)
BUN: 20 mg/dL (ref 8–27)
CALCIUM: 9.5 mg/dL (ref 8.7–10.3)
CHLORIDE: 102 mmol/L (ref 96–106)
CO2: 22 mmol/L (ref 20–29)
CREATININE: 1.07 mg/dL — AB (ref 0.57–1.00)
GFR calc non Af Amer: 52 mL/min/{1.73_m2} — ABNORMAL LOW (ref >59)
GFR, EST AFRICAN AMERICAN: 60 mL/min/{1.73_m2} (ref >59)
GLUCOSE: 101 mg/dL — AB (ref 65–99)
Potassium: 3.5 mmol/L (ref 3.5–5.2)
Sodium: 140 mmol/L (ref 134–144)

## 2018-09-19 LAB — IRON AND TIBC
Iron Saturation: 12 % — ABNORMAL LOW (ref 15–55)
Iron: 56 ug/dL (ref 27–139)
Total Iron Binding Capacity: 464 ug/dL — ABNORMAL HIGH (ref 250–450)
UIBC: 408 ug/dL — AB (ref 118–369)

## 2018-09-19 LAB — FERRITIN: FERRITIN: 59 ng/mL (ref 15–150)

## 2018-09-19 LAB — CBC
Hematocrit: 35 % (ref 34.0–46.6)
Hemoglobin: 11 g/dL — ABNORMAL LOW (ref 11.1–15.9)
MCH: 26.4 pg — ABNORMAL LOW (ref 26.6–33.0)
MCHC: 31.4 g/dL — ABNORMAL LOW (ref 31.5–35.7)
MCV: 84 fL (ref 79–97)
PLATELETS: 371 10*3/uL (ref 150–450)
RBC: 4.17 x10E6/uL (ref 3.77–5.28)
RDW: 14.1 % (ref 12.3–15.4)
WBC: 7.5 10*3/uL (ref 3.4–10.8)

## 2018-09-19 LAB — VITAMIN B12: VITAMIN B 12: 1007 pg/mL (ref 232–1245)

## 2018-09-19 LAB — VITAMIN D 25 HYDROXY (VIT D DEFICIENCY, FRACTURES): Vit D, 25-Hydroxy: 11.8 ng/mL — ABNORMAL LOW (ref 30.0–100.0)

## 2018-09-19 LAB — MAGNESIUM: MAGNESIUM: 1.6 mg/dL (ref 1.6–2.3)

## 2018-09-19 MED ORDER — VITAMIN D (ERGOCALCIFEROL) 1.25 MG (50000 UNIT) PO CAPS: 50000.0000 [IU] | ORAL_CAPSULE | ORAL | 0 refills | Status: AC

## 2018-09-20 ENCOUNTER — Telehealth: Payer: Self-pay | Admitting: Gastroenterology

## 2018-09-20 NOTE — Telephone Encounter (Signed)
Pt is calling to let Dr. Marius Ditch know the rx she was given Monday is giving her stomach cramps and making her dizzy please call pt, pls advise

## 2018-09-20 NOTE — Telephone Encounter (Signed)
We gave her creon. She can either completely stop or take only 1 pill with each meal  instead of 2 pills  Kendra Ritter

## 2018-09-20 NOTE — Telephone Encounter (Signed)
Pt is calling to let Dr. Marius Ditch know the rx she was given Monday is giving her stomach cramps and making her dizzy please call pt

## 2018-09-21 NOTE — Telephone Encounter (Signed)
Spoke with pt and she will stop taking medication for now and see how she feels and will call back if symptoms persist to make an appt

## 2018-09-22 ENCOUNTER — Other Ambulatory Visit: Payer: Self-pay | Admitting: Gastroenterology

## 2018-09-22 DIAGNOSIS — D513 Other dietary vitamin B12 deficiency anemia: Secondary | ICD-10-CM

## 2018-10-23 DIAGNOSIS — H2511 Age-related nuclear cataract, right eye: Secondary | ICD-10-CM | POA: Insufficient documentation

## 2018-11-14 ENCOUNTER — Encounter: Payer: Self-pay | Admitting: *Deleted

## 2018-11-14 ENCOUNTER — Emergency Department
Admission: EM | Admit: 2018-11-14 | Discharge: 2018-11-14 | Disposition: A | Discharge status: Home / Self Care | Payer: Medicare HMO | Attending: Emergency Medicine | Admitting: Emergency Medicine

## 2018-11-14 DIAGNOSIS — I509 Heart failure, unspecified: Secondary | ICD-10-CM | POA: Diagnosis not present

## 2018-11-14 DIAGNOSIS — Z952 Presence of prosthetic heart valve: Secondary | ICD-10-CM | POA: Insufficient documentation

## 2018-11-14 DIAGNOSIS — I110 Hypertensive heart disease with heart failure: Secondary | ICD-10-CM | POA: Insufficient documentation

## 2018-11-14 DIAGNOSIS — Z7982 Long term (current) use of aspirin: Secondary | ICD-10-CM | POA: Diagnosis not present

## 2018-11-14 DIAGNOSIS — R197 Diarrhea, unspecified: Secondary | ICD-10-CM | POA: Insufficient documentation

## 2018-11-14 DIAGNOSIS — R112 Nausea with vomiting, unspecified: Secondary | ICD-10-CM

## 2018-11-14 DIAGNOSIS — Z85038 Personal history of other malignant neoplasm of large intestine: Secondary | ICD-10-CM | POA: Insufficient documentation

## 2018-11-14 DIAGNOSIS — N3001 Acute cystitis with hematuria: Secondary | ICD-10-CM

## 2018-11-14 DIAGNOSIS — Z8673 Personal history of transient ischemic attack (TIA), and cerebral infarction without residual deficits: Secondary | ICD-10-CM | POA: Insufficient documentation

## 2018-11-14 DIAGNOSIS — Z79899 Other long term (current) drug therapy: Secondary | ICD-10-CM | POA: Diagnosis not present

## 2018-11-14 DIAGNOSIS — R1110 Vomiting, unspecified: Secondary | ICD-10-CM | POA: Diagnosis present

## 2018-11-14 LAB — COMPREHENSIVE METABOLIC PANEL
ALBUMIN: 3.7 g/dL (ref 3.5–5.0)
ALK PHOS: 59 U/L (ref 38–126)
ALT: 11 U/L (ref 0–44)
ANION GAP: 7 (ref 5–15)
AST: 32 U/L (ref 15–41)
BUN: 17 mg/dL (ref 8–23)
CALCIUM: 9.1 mg/dL (ref 8.9–10.3)
CO2: 25 mmol/L (ref 22–32)
Chloride: 106 mmol/L (ref 98–111)
Creatinine, Ser: 1.1 mg/dL — ABNORMAL HIGH (ref 0.44–1.00)
GFR calc non Af Amer: 50 mL/min — ABNORMAL LOW (ref >60)
GFR, EST AFRICAN AMERICAN: 58 mL/min — AB (ref >60)
Glucose, Bld: 122 mg/dL — ABNORMAL HIGH (ref 70–99)
Potassium: 3.3 mmol/L — ABNORMAL LOW (ref 3.5–5.1)
SODIUM: 138 mmol/L (ref 135–145)
Total Bilirubin: 1.1 mg/dL (ref 0.3–1.2)
Total Protein: 7.4 g/dL (ref 6.5–8.1)

## 2018-11-14 LAB — C DIFFICILE QUICK SCREEN W PCR REFLEX
C Diff antigen: NEGATIVE
C Diff interpretation: NOT DETECTED
C Diff toxin: NEGATIVE

## 2018-11-14 LAB — URINALYSIS, COMPLETE (UACMP) WITH MICROSCOPIC
BILIRUBIN URINE: NEGATIVE
GLUCOSE, UA: NEGATIVE mg/dL
Ketones, ur: NEGATIVE mg/dL
Nitrite: POSITIVE — AB
PH: 5 (ref 5.0–8.0)
Protein, ur: NEGATIVE mg/dL
SPECIFIC GRAVITY, URINE: 1.01 (ref 1.005–1.030)
WBC, UA: >50 WBC/hpf — ABNORMAL HIGH (ref 0–5)

## 2018-11-14 LAB — CBC
HCT: 36.4 % (ref 36.0–46.0)
Hemoglobin: 11.4 g/dL — ABNORMAL LOW (ref 12.0–15.0)
MCH: 26.9 pg (ref 26.0–34.0)
MCHC: 31.3 g/dL (ref 30.0–36.0)
MCV: 85.8 fL (ref 80.0–100.0)
NRBC: 0 % (ref 0.0–0.2)
PLATELETS: 416 10*3/uL — AB (ref 150–400)
RBC: 4.24 MIL/uL (ref 3.87–5.11)
RDW: 14.2 % (ref 11.5–15.5)
WBC: 8.3 10*3/uL (ref 4.0–10.5)

## 2018-11-14 LAB — LIPASE, BLOOD: LIPASE: 30 U/L (ref 11–51)

## 2018-11-14 MED ORDER — CEPHALEXIN 500 MG PO CAPS: 500.0000 mg | ORAL_CAPSULE | Freq: Three times a day (TID) | ORAL | 0 refills | Status: AC

## 2018-11-14 MED ORDER — ONDANSETRON 4 MG PO TBDP: 4.0000 mg | ORAL_TABLET | Freq: Three times a day (TID) | ORAL | 0 refills | Status: DC | PRN

## 2018-11-14 MED ORDER — SODIUM CHLORIDE 0.9 % IV SOLN
1.0000 g | Freq: Once | INTRAVENOUS | Status: AC
  Administered 2018-11-14: 1 g via INTRAVENOUS
  Filled 2018-11-14: qty 10

## 2018-11-14 MED ORDER — SODIUM CHLORIDE 0.9% FLUSH
3.0000 mL | Freq: Once | INTRAVENOUS | Status: AC
  Administered 2018-11-14: 3 mL via INTRAVENOUS

## 2018-11-14 MED ORDER — ONDANSETRON HCL 4 MG/2ML IJ SOLN
4.0000 mg | Freq: Once | INTRAMUSCULAR | Status: AC
  Administered 2018-11-14: 4 mg via INTRAVENOUS
  Filled 2018-11-14: qty 2

## 2018-11-14 MED ORDER — SODIUM CHLORIDE 0.9 % IV BOLUS
1000.0000 mL | Freq: Once | INTRAVENOUS | Status: AC
  Administered 2018-11-14: 1000 mL via INTRAVENOUS

## 2018-11-14 NOTE — ED Provider Notes (Signed)
[facility] Emergency Department Provider Note  ____________________________________________  Time seen: Approximately 11:58 AM  I have reviewed the triage vital signs and the nursing notes.   HISTORY  Chief Complaint Diarrhea and Abdominal Pain   HPI Kendra Ritter is a 73 y.o. female with a history of CVA, hypertension, aortic valve replacement, colon cancer status post colectomy and ostomy, CHF, anemia who presents for evaluation of vomiting and diarrhea.  Patient reports 1 week of several daily episodes of nonbloody nonbilious emesis and watery diarrhea, no melena, hematemesis, coffee-ground emesis, or hematochezia.  She is also complaining of intermittent sharp cramping abdominal pain for the last 7 days.  No pain at this time.  Patient has tried Imodium with no significant relief.  She reports over the last 24 hours 3 episodes of vomiting and 5 of diarrhea.  No fever or chills, no cough, congestion, chest pain or shortness of breath.  No recent antibiotic use.  No history of C. Difficile.  Past Medical History:  Diagnosis Date  . Anemia   . BPPV (benign paroxysmal positional vertigo)   . CHF (congestive heart failure) ([place])   . Colon cancer ([place]) 1998  . GERD (gastroesophageal reflux disease)   . H/O aortic valve replacement   . Heart murmur   . Hx of cervical cancer   . Hypertension   . Stroke [facility])    multiple strokes, 2010, 2011, 2013     Patient Active Problem List   Diagnosis Date Noted  . 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 Transient cerebral ischemia   . TIA (transient ischemic attack) 08/21/2016  . Gastroesophageal reflux disease 09/22/2015  . History of CVA (cerebrovascular accident) 09/22/2015  . Sleep apnea 09/22/2015  . Chest pain 03/06/2014  . S/P MVR (mitral valve repair) 03/06/2014  . Murmur 03/06/2014  . SOB (shortness of breath) 03/06/2014  . HTN (hypertension) 03/06/2014    Past Surgical History:  Procedure Laterality  Date  . AORTIC VALVE REPLACEMENT  2008   [facility]  . APPENDECTOMY    . CARDIAC CATHETERIZATION  2008   [facility]  . COLOSTOMY  1998   After colon cancer   . partial hysterectomy     cervical cancer    Prior to Admission medications   Medication Sig Start Date End Date Taking? Authorizing Provider  amLODipine (NORVASC) 10 MG tablet Take 1 tablet (10 mg total) by mouth daily. 04/10/14  Yes Minna Merritts, MD  aspirin EC 81 MG EC tablet Take 1 tablet (81 mg total) by mouth daily. 08/24/16  Yes Gouru, Illene Silver, MD  carvedilol (COREG) 25 MG tablet Take 25 mg by mouth 2 (two) times daily with a meal.   Yes [provider]  cloNIDine (CATAPRES) 0.1 MG tablet Take 0.1 mg by mouth daily.  07/19/17  Yes [provider]  Cyanocobalamin (B-12) 2500 MCG SUBL Place 2,500 mcg under the tongue daily.   Yes [provider]  diphenoxylate-atropine (LOMOTIL) 2.5-0.025 MG tablet Take 2 tablets by mouth 4 (four) times daily.   Yes [provider]  ferrous sulfate 325 (65 FE) MG tablet TAKE 1 TABLET (325 MG TOTAL) BY MOUTH 2 (TWO) TIMES DAILY WITH A MEAL. 08/14/18  Yes Vanga, Tally Due, MD  gabapentin (NEURONTIN) 300 MG capsule Take 300 mg by mouth 3 (three) times daily.  01/29/14  Yes [provider]  hydrALAZINE (APRESOLINE) 25 MG tablet Take 25 mg by mouth 3 (three) times daily. 07/31/18  Yes [provider]  latanoprost (XALATAN) 0.005 % ophthalmic solution Place 1 drop into both eyes at bedtime. 04/22/16  Yes [provider]  Omega-3 Fatty Acids (FISH OIL) 1000 MG CAPS Take 1,000 mg by mouth daily.   Yes [provider]  Potassium 99 MG TABS Take 99 mg by mouth daily.   Yes [provider]  spironolactone (ALDACTONE) 25 MG tablet TAKE ONE TABLET BY MOUTH DAILY- TO REPLACE HCTZ 08/02/17  Yes [provider]  Vitamin D, Ergocalciferol, (DRISDOL) 1.25 MG (50000 UT) CAPS capsule Take 1 capsule (50,000 Units total) by mouth every 7  (seven) days for 12 doses. 09/19/18 12/06/18 Yes Vanga, Tally Due, MD  cephALEXin (KEFLEX) 500 MG capsule Take 1 capsule (500 mg total) by mouth 3 (three) times daily for 7 days. 11/14/18 11/21/18  Rudene Re, MD  ondansetron (ZOFRAN ODT) 4 MG disintegrating tablet Take 1 tablet (4 mg total) by mouth every 8 (eight) hours as needed. 11/14/18   Rudene Re, MD    Allergies Lisinopril  Family History  Problem Relation Age of Onset  . Heart disease Mother   . Heart attack Mother   . Hypertension Mother   . Breast cancer Maternal Aunt     Social History Social History   Tobacco Use  . Smoking status: Never Smoker  . Smokeless tobacco: Never Used  Substance Use Topics  . Alcohol use: No  . Drug use: No    Review of Systems  Constitutional: Negative for fever. Eyes: Negative for visual changes. ENT: Negative for sore throat. Neck: No neck pain  Cardiovascular: Negative for chest pain. Respiratory: Negative for shortness of breath. Gastrointestinal: + cramping abdominal pain, vomiting and diarrhea. Genitourinary: Negative for dysuria. Musculoskeletal: Negative for back pain. Skin: Negative for rash. Neurological: Negative for headaches, weakness or numbness. Psych: No SI or HI  ____________________________________________   PHYSICAL EXAM:  VITAL SIGNS: ED Triage Vitals [11/14/18 0951]  Enc Vitals Group     BP (!) 150/87     Pulse Rate 78     Resp 16     Temp 98.4 F (36.9 C)     Temp Source Oral     SpO2 98 %     Weight 161 lb (73 kg)     Height 5\' 2"  (1.575 m)     Head Circumference      Peak Flow      Pain Score 7     Pain Loc      Pain Edu?      Excl. in [place]?     Constitutional: Alert and oriented. Well appearing and in no apparent distress. HEENT:      Head: Normocephalic and atraumatic.         Eyes: Conjunctivae are normal. Sclera is non-icteric.       Mouth/Throat: Mucous membranes are moist.       Neck: Supple with no signs of  meningismus. Cardiovascular: Regular rate and rhythm. No murmurs, gallops, or rubs. 2+ symmetrical distal pulses are present in all extremities. No JVD. Respiratory: Normal respiratory effort. Lungs are clear to auscultation bilaterally. No wheezes, crackles, or rhonchi.  Gastrointestinal: Soft, mild diffuse tenderness to palpation, and non distended with positive bowel sounds. No rebound or guarding. Musculoskeletal: Nontender with normal range of motion in all extremities. No edema, cyanosis, or erythema of extremities. Neurologic: Normal speech and language. Face is symmetric. Moving all extremities. No gross focal neurologic deficits are appreciated. Skin: Skin is warm, dry and intact. No rash noted. Psychiatric:  Mood and affect are normal. Speech and behavior are normal.  ____________________________________________   LABS (all labs ordered are listed, but only abnormal results are displayed)  Labs Reviewed  COMPREHENSIVE METABOLIC PANEL - Abnormal; Notable for the following components:      Result Value   Potassium 3.3 (*)    Glucose, Bld 122 (*)    Creatinine, Ser 1.10 (*)    GFR calc non Af Amer 50 (*)    GFR calc Af Amer 58 (*)    All other components within normal limits  CBC - Abnormal; Notable for the following components:   Hemoglobin 11.4 (*)    Platelets 416 (*)    All other components within normal limits  URINALYSIS, COMPLETE (UACMP) WITH MICROSCOPIC - Abnormal; Notable for the following components:   Color, Urine YELLOW (*)    APPearance CLOUDY (*)    Hgb urine dipstick LARGE (*)    Nitrite POSITIVE (*)    Leukocytes, UA LARGE (*)    RBC / HPF >50 (*)    WBC, UA >50 (*)    Bacteria, UA MANY (*)    All other components within normal limits  C DIFFICILE QUICK SCREEN W PCR REFLEX  URINE CULTURE  LIPASE, BLOOD   ____________________________________________  EKG  none  ____________________________________________  RADIOLOGY  none    ____________________________________________   PROCEDURES  Procedure(s) performed: None Procedures Critical Care performed:  None ____________________________________________   INITIAL IMPRESSION / ASSESSMENT AND PLAN / ED COURSE   73 y.o. female with a history of CVA, hypertension, aortic valve replacement, colon cancer status post colectomy and ostomy, CHF, anemia who presents for evaluation of vomiting and diarrhea x 7 days.  Patient is well-appearing and in no distress with normal vital signs, abdomen is soft with mild diffuse tenderness throughout, no localized rebound or guarding or tenderness, positive bowel sounds.  Differential diagnoses including viral gastroenteritis versus enteritis versus colitis versus C. Difficile  We will give IV fluids and Zofran for nausea and vomiting.  Will check basic labs.  We will send stool for culture for C. difficile.  Clinical Course as of Nov 14 1456  Tue Nov 14, 2018  1456 Differential negative.  UA is positive for UTI.  No signs of sepsis.  Patient was given Rocephin.  Nausea has resolved.  Remains with no abdominal pain.  Tolerating p.o. with no further episodes of vomiting.  Will discharge home on Keflex, Zofran, and follow-up with primary care doctor.  Discussed return precautions.   [CV]    Clinical Course User Index [CV] Alfred Levins [place], MD     As part of my medical decision making, I reviewed the following data within the [place] notes reviewed and incorporated, Labs reviewed , Old chart reviewed, Notes from prior ED visits and [place] Controlled Substance Database    Pertinent labs & imaging results that were available during my care of the patient were reviewed by me and considered in my medical decision making (see chart for details).    ____________________________________________   FINAL CLINICAL IMPRESSION(S) / ED DIAGNOSES  Final diagnoses:  Nausea vomiting and diarrhea  Acute cystitis  with hematuria      NEW MEDICATIONS STARTED DURING THIS VISIT:  ED Discharge Orders         Ordered    ondansetron (ZOFRAN ODT) 4 MG disintegrating tablet  Every 8 hours PRN     11/14/18 1457    cephALEXin (KEFLEX) 500 MG capsule  3 times  daily     11/14/18 1457           Note:  This document was prepared using Dragon voice recognition software and may include unintentional dictation errors.    Alfred Levins, [place], MD 11/14/18 1500

## 2018-11-14 NOTE — ED Triage Notes (Signed)
Pt is here for diarrhea for about a week and a half.  Pt states that this is unrelieved with OTC antidiarrheal medications.  Pt sates that this has had about 9 episodes of diarrhea in the past 24 hours.  She states that she has had a lack of appetite and when she does eat she has had to vomit right away.  Pt also reports abdominal pain.  No fever and chills.

## 2018-11-14 NOTE — ED Notes (Signed)
Pt is unable to provide UA nor a stool sample at this time.

## 2018-11-16 LAB — URINE CULTURE

## 2018-11-21 ENCOUNTER — Ambulatory Visit: Payer: Medicare HMO | Admitting: Gastroenterology

## 2018-11-23 ENCOUNTER — Other Ambulatory Visit: Payer: Self-pay

## 2018-11-23 ENCOUNTER — Ambulatory Visit (INDEPENDENT_AMBULATORY_CARE_PROVIDER_SITE_OTHER): Payer: Medicare HMO | Admitting: Gastroenterology

## 2018-11-23 ENCOUNTER — Encounter: Payer: Self-pay | Admitting: Gastroenterology

## 2018-11-23 VITALS — BP 128/79 | HR 68 | Resp 17 | Ht 63.0 in | Wt 156.4 lb

## 2018-11-23 DIAGNOSIS — Z932 Ileostomy status: Secondary | ICD-10-CM

## 2018-11-23 DIAGNOSIS — R198 Other specified symptoms and signs involving the digestive system and abdomen: Secondary | ICD-10-CM

## 2018-11-23 NOTE — Progress Notes (Signed)
Cephas Darby, MD [address]  [place]  [place], [place] [address]  Main: [phone]  Fax: [phone]    Gastroenterology Consultation  Referring Provider:     Letta Median, MD Primary Care Physician:  Letta Median, MD Primary Gastroenterologist:  Dr. Cephas Darby Reason for Consultation:  High ostomy output        HPI:   Kendra Ritter is a 73 y.o. female referred by Dr. Letta Median, MD  for consultation & management of colonoscopy.  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  She reports having high ostomy output resulting in fatigue, lightheadedness.  She empties her ostomy bag every 1/2-2 hours and 3 times at night.  She reports the output is liquid brown.  She was suggested to take Imodium as needed which is not really helping.  She has not tried any fiber supplements or bile acid sequestrants.  She denies any weight loss, fever, chills, blood in stools, abdominal pain, nausea, vomiting.  Her last follow-up at [facility] with colorectal surgery was in 04/2017.  She was on TPN for short term postoperatively.  She also has a history of chronic normocytic anemia.  Her blood pressure is very high in clinic today and she did not take clonidine because she reports symptoms of lightheadedness after the medication.  She denies headache, chest pain, shortness of breath in clinic today.  Follow up visit 09/12/17: Since last visit, she was found to have AKI on labs, I told her to go to ER to get fluids and Cr improved. She did not tolerate cholestyramine as  she developed cramps. She increased imodium to 5times daily and her ostomy output has improved. Still emptying her bag 3 times at night and several times during day  Follow-up visit 05/08/2018 She reports ongoing high ostomy output, watery brown output, emptying bag several times daily, feels fatigue and dehydrated. She is taking Imodium only twice daily. She does not recall if motofen helped. She is also here to receive B12 shot. Her most recent hemoglobin is stable, B12 levels are normal, so are iron studies and folate levels.  Follow-up visit 05/31/2018 Overall, her ostomy output has improved, the stools are applesauce consistency, brown and she is emptying the bag only 3 times in a day. And, not waking up at night to empty her bag. She is currently on Lomotil 2 tablets 4 times daily along with Imodium 1 tablet 4 times daily. Pancreatic fecal elastase levels normal, C. Difficile was negative.she continues to take B12 injections. Her weight has been stable. Her kidney function is improving  Follow-up visit 09/18/2018 She denies any complaints today.  She reports having moderate ostomy output, emptying bag about 3 times a day. Stool consistency varies between liquid and soft but mostly liquid.  She is taking oral iron as well as oral B12.  She is taking Imodium as well as Lomotil 4 times a day  Follow up visit 11/23/18 She had high ostomy output, nonbloody diarrhea last week, went to ER, she had abnormal UA, treated with keflex for UTI, and was told she had stomach flu. Diarrhea has now resolved. Apple sauce  consistency, on lomotil 2pills QID and imodium 1 pill TID. She did not tolerate creon, gave cramps, so asked her to stop. Iron levels are improving. Hb is stable  NSAIDs: None  Antiplts/Anticoagulants/Anti thrombotics: None  GI Procedures: Colonoscopy at [facility] in 12/2016 via ostomy.  There is no evidence of Crohn's disease The mucosa appeared unremarkable.  The neo terminal ileum was normal A:  Colon, biopsy  - Fragments of benign colonic mucosa - No changes suggestive of ischemic colitis identified - No active inflammation, viral cytopathic effect, granulomas, or dysplasia identified A: "Small bowel and colon containing fistula to bladder", resection - Multiple colonic adenomatous polyps, two contain high grade dysplasia; no carcinoma identified - Focal full thickness colonic wall disruption (clincal history of colovesical fistula) - Two intact anastomoses - Serosal adhesions - Distal margin has focal epithelial atypia that is indefinite for low grade dysplasia - Proximal margin is unremarkable - Fourteen histologically unremarkable lymph nodes have been examined - No granulomas or viral cytopathic effect identified  Past Medical History:  Diagnosis Date  . Anemia   . BPPV (benign paroxysmal positional vertigo)   . CHF (congestive heart failure) ([place])   . Colon cancer ([place]) 1998  . GERD (gastroesophageal reflux disease)   . H/O aortic valve replacement   . Heart murmur   . Hx of cervical cancer   . Hypertension   . Stroke [facility])    multiple strokes, 2010, 2011, 2013     Past Surgical History:  Procedure Laterality Date  . AORTIC VALVE REPLACEMENT  2008   [facility]  . APPENDECTOMY    . CARDIAC CATHETERIZATION  2008   [facility]  . COLOSTOMY  1998   After colon cancer   . partial hysterectomy     cervical cancer     Current Outpatient Medications:  .  amLODipine (NORVASC) 10 MG tablet, Take 1 tablet (10 mg total) by mouth daily., Disp: 30 tablet, Rfl: 6 .  aspirin EC 81 MG EC tablet, Take 1 tablet (81 mg total) by mouth daily., Disp: , Rfl:  .  carvedilol (COREG) 25 MG tablet, Take 25 mg by mouth 2 (two) times daily with a meal., Disp: , Rfl:  .  cloNIDine (CATAPRES) 0.1 MG tablet, Take 0.1 mg by mouth daily. , Disp: , Rfl:  .  Cyanocobalamin (B-12) 2500 MCG SUBL, Place 2,500 mcg under the tongue daily., Disp: , Rfl:  .  diphenoxylate-atropine (LOMOTIL) 2.5-0.025 MG  tablet, Take 2 tablets by mouth 4 (four) times daily., Disp: , Rfl:  .  ferrous sulfate 325 (65 FE) MG tablet, TAKE 1 TABLET (325 MG TOTAL) BY MOUTH 2 (TWO) TIMES DAILY WITH A MEAL., Disp: 180 tablet, Rfl: 1 .  gabapentin (NEURONTIN) 300 MG capsule, Take 300 mg by mouth 3 (three) times daily. , Disp: , Rfl:  .  hydrALAZINE (APRESOLINE) 25 MG tablet, Take 25 mg by mouth 3 (three) times daily., Disp: , Rfl: 1 .  latanoprost (XALATAN) 0.005 % ophthalmic solution, Place 1 drop into both eyes at bedtime., Disp: , Rfl:  .  losartan (COZAAR) 100 MG tablet, , Disp: , Rfl:  .  Omega-3 Fatty Acids (FISH OIL) 1000 MG CAPS, Take 1,000 mg by mouth daily., Disp: , Rfl:  .  ondansetron (ZOFRAN ODT) 4 MG disintegrating tablet, Take 1 tablet (4 mg total) by mouth every 8 (eight) hours as needed., Disp: 20 tablet, Rfl: 0 .  Potassium 99 MG TABS, Take 99 mg by mouth daily., Disp: , Rfl:  .  spironolactone (ALDACTONE) 25 MG tablet, TAKE ONE TABLET BY MOUTH DAILY- TO REPLACE HCTZ, Disp: , Rfl: 1 .  Vitamin D, Ergocalciferol, (DRISDOL) 1.25 MG (50000 UT) CAPS capsule, Take 1 capsule (50,000 Units total) by mouth every 7 (seven) days for 12 doses., Disp: 12 capsule, Rfl: 0   Family History  Problem Relation Age of Onset  . Heart disease Mother   . Heart attack Mother   . Hypertension Mother   . Breast cancer Maternal Aunt      Social History   Tobacco Use  . Smoking status: Never Smoker  . Smokeless tobacco: Never Used  Substance Use Topics  . Alcohol use: No  . Drug use: No    Allergies as of 11/23/2018 - Review Complete 11/23/2018  Allergen Reaction Noted  . Lisinopril Anaphylaxis and Cough 04/10/2014    Review of Systems:    All systems reviewed and negative except where noted in HPI.   Physical Exam:  BP 128/79 (BP Location: Left Arm, Patient Position: Sitting, Cuff Size: Normal)   Pulse 68   Resp 17   Ht 5\' 3"  (1.6 m)   Wt 156 lb 6.4 oz (70.9 kg)   BMI 27.71 kg/m  No LMP recorded.  Patient has had a hysterectomy.  General:   Alert,  Well-developed, well-nourished, pleasant and cooperative in NAD Head:  Normocephalic and atraumatic. Eyes:  Sclera clear, no icterus.   Conjunctiva pink. Ears:  Normal auditory acuity. Nose:  No deformity, discharge, or lesions. Mouth:  No deformity or lesions,oropharynx pink & moist. Neck:  Supple; no masses or thyromegaly. Lungs:  Respirations even and unlabored.  Clear throughout to auscultation.   No wheezes, crackles, or rhonchi. No acute distress. Heart:  Regular rate and rhythm; no murmurs, clicks, rubs, or gallops. Abdomen:  Normal bowel sounds. Soft, non-tender and distended, and colostomy in the left flank region, nontender, parastomal hernia, without masses, no hepatosplenomegaly noted.  No guarding or rebound tenderness.   Rectal: Nor performed Msk:  Symmetrical without gross deformities. Good, equal movement & strength bilaterally. Pulses:  Normal pulses noted. Extremities:  No clubbing or edema.  No cyanosis. Neurologic:  Alert and oriented x3;  grossly normal neurologically. Skin:  Intact without significant lesions or rashes. No jaundice Psych:  Alert and cooperative. Normal mood and affect.   Imaging Studies: Reviewed  Assessment and Plan:   Kendra Ritter is a 73 y.o. 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

## 2018-12-20 ENCOUNTER — Other Ambulatory Visit: Payer: Self-pay | Admitting: Gastroenterology

## 2018-12-20 DIAGNOSIS — E559 Vitamin D deficiency, unspecified: Secondary | ICD-10-CM

## 2019-02-01 ENCOUNTER — Telehealth: Payer: Self-pay

## 2019-02-01 NOTE — Telephone Encounter (Signed)
Called patient from recall.  Patient did not want an Evisit.  Old recall will be deleted. New recall will be placed for August.

## 2019-04-11 ENCOUNTER — Other Ambulatory Visit: Payer: Self-pay

## 2019-04-11 ENCOUNTER — Telehealth: Payer: Self-pay | Admitting: Gastroenterology

## 2019-04-11 MED ORDER — DIPHENOXYLATE-ATROPINE 2.5-0.025 MG PO TABS: 2.0000 | ORAL_TABLET | Freq: Four times a day (QID) | ORAL | 0 refills | Status: DC | PRN

## 2019-04-11 NOTE — Telephone Encounter (Signed)
Medication has been refilled and sent to pharmacy, pt has been notified and verbalized understanding  

## 2019-04-11 NOTE — Telephone Encounter (Signed)
Pt is scheduled for f/u apt in August she needs refill on rx  diphenoxylate-atropine (LOMOTIL) 2.5-0.025 MG tablet send to [place]

## 2019-05-28 ENCOUNTER — Ambulatory Visit: Payer: Medicare HMO | Admitting: Gastroenterology

## 2019-06-14 ENCOUNTER — Other Ambulatory Visit: Payer: Self-pay | Admitting: Gastroenterology

## 2019-06-21 ENCOUNTER — Other Ambulatory Visit: Payer: Self-pay

## 2019-06-21 ENCOUNTER — Ambulatory Visit (INDEPENDENT_AMBULATORY_CARE_PROVIDER_SITE_OTHER): Payer: Medicare HMO | Admitting: Gastroenterology

## 2019-06-21 VITALS — BP 136/82 | HR 61 | Temp 98.1°F | Ht 63.0 in | Wt 156.8 lb

## 2019-06-21 DIAGNOSIS — D509 Iron deficiency anemia, unspecified: Secondary | ICD-10-CM

## 2019-06-21 DIAGNOSIS — K912 Postsurgical malabsorption, not elsewhere classified: Secondary | ICD-10-CM

## 2019-06-21 NOTE — Progress Notes (Signed)
Kendra Darby, MD [address]  [place]  [place], [place] [address]  Main: [phone]  Fax: [phone]    Gastroenterology Consultation  Referring Provider:     Letta Median, MD Primary Care Physician:  Letta Median, MD Primary Gastroenterologist:  Dr. Cephas Ritter Reason for Consultation:  High ostomy output        HPI:   Kendra Ritter is a 73 y.o. female referred by Dr. Letta Median, MD  for consultation & management of colonoscopy.  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  She reports having high ostomy output resulting in fatigue, lightheadedness.  She empties her ostomy bag every 1/2-2 hours and 3 times at night.  She reports the output is liquid brown.  She was suggested to take Imodium as needed which is not really helping.  She has not tried any fiber supplements or bile acid sequestrants.  She denies any weight loss, fever, chills, blood in stools, abdominal pain, nausea, vomiting.  Her last follow-up at [facility] with colorectal surgery was in 04/2017.  She was on TPN for short term postoperatively.  She also has a history of chronic normocytic anemia.  Her blood pressure is very high in clinic today and she did not take clonidine because she reports symptoms of lightheadedness after the medication.  She denies headache, chest pain, shortness of breath in clinic today.  Follow up visit 09/12/17: Since last visit, she was found to have AKI on labs, I told her to go to ER to get fluids and Cr improved. She did not tolerate cholestyramine as  she developed cramps. She increased imodium to 5times daily and her ostomy output has improved. Still emptying her bag 3 times at night and several times during day  Follow-up visit 05/08/2018 She reports ongoing high ostomy output, watery brown output, emptying bag several times daily, feels fatigue and dehydrated. She is taking Imodium only twice daily. She does not recall if motofen helped. She is also here to receive B12 shot. Her most recent hemoglobin is stable, B12 levels are normal, so are iron studies and folate levels.  Follow-up visit 05/31/2018 Overall, her ostomy output has improved, the stools are applesauce consistency, brown and she is emptying the bag only 3 times in a day. And, not waking up at night to empty her bag. She is currently on Lomotil 2 tablets 4 times daily along with Imodium 1 tablet 4 times daily. Pancreatic fecal elastase levels normal, C. Difficile was negative.she continues to take B12 injections. Her weight has been stable. Her kidney function is improving  Follow-up visit 09/18/2018 She denies any complaints today.  She reports having moderate ostomy output, emptying bag about 3 times a day. Stool consistency varies between liquid and soft but mostly liquid.  She is taking oral iron as well as oral B12.  She is taking Imodium as well as Lomotil 4 times a day  Follow up visit 11/23/18 She had high ostomy output, nonbloody diarrhea last week, went to ER, she had abnormal UA, treated with keflex for UTI, and was told she had stomach flu. Diarrhea has now resolved. Apple sauce  consistency, on lomotil 2pills QID and imodium 1 pill TID. She did not tolerate creon, gave cramps, so asked her to stop. Iron levels are improving. Hb is stable  Follow-up visit 06/21/2019 Patient did not have any major complaints today.  However, she does have high ostomy output and she has to change the bag frequently.  Her stools are anywhere from watery to applesauce consistency.  She is taking  Lomotil 2 pills only 2 times a day and Imodium 2 times a day.  She is taking oral iron 1 to 2 pills daily.  Her hemoglobin has been stable.  NSAIDs: None  Antiplts/Anticoagulants/Anti thrombotics: None  GI Procedures: Colonoscopy at [facility] in 12/2016 via ostomy.  There is no evidence of Crohn's disease The mucosa appeared unremarkable.  The neo terminal ileum was normal A: Colon, biopsy  - Fragments of benign colonic mucosa - No changes suggestive of ischemic colitis identified - No active inflammation, viral cytopathic effect, granulomas, or dysplasia identified A: "Small bowel and colon containing fistula to bladder", resection - Multiple colonic adenomatous polyps, two contain high grade dysplasia; no carcinoma identified - Focal full thickness colonic wall disruption (clincal history of colovesical fistula) - Two intact anastomoses - Serosal adhesions - Distal margin has focal epithelial atypia that is indefinite for low grade dysplasia - Proximal margin is unremarkable - Fourteen histologically unremarkable lymph nodes have been examined - No granulomas or viral cytopathic effect identified  Past Medical History:  Diagnosis Date  . Anemia   . BPPV (benign paroxysmal positional vertigo)   . CHF (congestive heart failure) ([place])   . Colon cancer ([place]) 1998  . GERD (gastroesophageal reflux disease)   . H/O aortic valve replacement   . Heart murmur   . Hx of cervical cancer   . Hypertension   . Stroke [facility])    multiple strokes, 2010, 2011, 2013     Past Surgical History:  Procedure Laterality Date  . AORTIC VALVE REPLACEMENT  2008   [facility]  . APPENDECTOMY    . CARDIAC CATHETERIZATION  2008   [facility]  . COLOSTOMY  1998   After colon cancer   . partial hysterectomy     cervical cancer     Current Outpatient Medications:  .  amLODipine (NORVASC) 10 MG tablet, Take 1 tablet (10 mg total) by mouth daily., Disp: 30 tablet, Rfl: 6 .  aspirin EC 81 MG EC tablet, Take 1 tablet (81  mg total) by mouth daily., Disp: , Rfl:  .  carvedilol (COREG) 25 MG tablet, Take 25 mg by mouth 2 (two) times daily with a meal., Disp: , Rfl:  .  cloNIDine (CATAPRES) 0.1 MG tablet, Take 0.1 mg by mouth daily. , Disp: , Rfl:  .  Cyanocobalamin (B-12) 2500 MCG SUBL, Place 2,500 mcg under the tongue daily., Disp: , Rfl:  .  diphenoxylate-atropine (LOMOTIL) 2.5-0.025 MG tablet, TAKE 2 TABLETS BY MOUTH FOUR TIMES DAILY AS NEEDED FOR DIARRHEA OR LOOSE STOOLS, Disp: 90 tablet, Rfl: 0 .  ferrous sulfate 325 (65 FE) MG tablet, TAKE 1 TABLET (325 MG TOTAL) BY MOUTH 2 (TWO) TIMES DAILY WITH A MEAL., Disp: 180 tablet, Rfl: 1 .  gabapentin (NEURONTIN) 300 MG capsule, Take 300 mg by mouth 3 (three) times daily. , Disp: , Rfl:  .  hydrALAZINE (APRESOLINE) 25 MG tablet, Take 25 mg by mouth 3 (three) times daily., Disp: , Rfl: 1 .  latanoprost (XALATAN) 0.005 % ophthalmic solution, Place 1 drop into both eyes at bedtime.,  Disp: , Rfl:  .  losartan (COZAAR) 100 MG tablet, , Disp: , Rfl:  .  Omega-3 Fatty Acids (FISH OIL) 1000 MG CAPS, Take 1,000 mg by mouth daily., Disp: , Rfl:  .  omeprazole (PRILOSEC) 40 MG capsule, , Disp: , Rfl:  .  ondansetron (ZOFRAN ODT) 4 MG disintegrating tablet, Take 1 tablet (4 mg total) by mouth every 8 (eight) hours as needed., Disp: 20 tablet, Rfl: 0 .  Potassium 99 MG TABS, Take 99 mg by mouth daily., Disp: , Rfl:  .  potassium chloride (K-DUR) 10 MEQ tablet, , Disp: , Rfl:  .  spironolactone (ALDACTONE) 25 MG tablet, TAKE ONE TABLET BY MOUTH DAILY- TO REPLACE HCTZ, Disp: , Rfl: 1 .  triamcinolone ointment (KENALOG) 0.1 %, , Disp: , Rfl:  .  prednisoLONE acetate (PRED FORTE) 1 % ophthalmic suspension, APPLY TO THE OPERATIVE EYE, 1 DROP FOUR TIMES A DAY, Disp: , Rfl:    Family History  Problem Relation Age of Onset  . Heart disease Mother   . Heart attack Mother   . Hypertension Mother   . Breast cancer Maternal Aunt      Social History   Tobacco Use  . Smoking status:  Never Smoker  . Smokeless tobacco: Never Used  Substance Use Topics  . Alcohol use: No  . Drug use: No    Allergies as of 06/21/2019 - Review Complete 06/21/2019  Allergen Reaction Noted  . Lisinopril Anaphylaxis and Cough 04/10/2014    Review of Systems:    All systems reviewed and negative except where noted in HPI.   Physical Exam:  BP 136/82   Pulse 61   Temp 98.1 F (36.7 C) (Oral)   Ht 5\' 3"  (1.6 m)   Wt 156 lb 12.8 oz (71.1 kg)   BMI 27.78 kg/m  No LMP recorded. Patient has had a hysterectomy.  General:   Alert,  Well-developed, well-nourished, pleasant and cooperative in NAD Head:  Normocephalic and atraumatic. Eyes:  Sclera clear, no icterus.   Conjunctiva pink. Ears:  Normal auditory acuity. Nose:  No deformity, discharge, or lesions. Mouth:  No deformity or lesions,oropharynx pink & moist. Neck:  Supple; no masses or thyromegaly. Lungs:  Respirations even and unlabored.  Clear throughout to auscultation.   No wheezes, crackles, or rhonchi. No acute distress. Heart:  Regular rate and rhythm; no murmurs, clicks, rubs, or gallops. Abdomen:  Normal bowel sounds. Soft, non-tender and distended, and colostomy in the left flank region, nontender, parastomal hernia, without masses, no hepatosplenomegaly noted.  No guarding or rebound tenderness.   Rectal: Nor performed Msk:  Symmetrical without gross deformities. Good, equal movement & strength bilaterally. Pulses:  Normal pulses noted. Extremities:  No clubbing or edema.  No cyanosis. Neurologic:  Alert and oriented x3;  grossly normal neurologically. Skin:  Intact without significant lesions or rashes. No jaundice Psych:  Alert and cooperative. Normal mood and affect.   Imaging Studies: Reviewed  Assessment and Plan:   Kendra Ritter is a 73 y.o. 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  High ostomy output: fairly regulated, does not have dehydration or electrolyte abnormalities Colonoscopy normal, Normal pancreatic fecal elastase Stool studies negative for infection - Advised her to increase Lomotil 2 pills to 4 times daily - continue imodium 2mg  1-2pills every 6hrs  as needed - Recheck labs today  Normocytic anemia: secondary to iron and B12 deficiency Normal ferritin, iron, high TIBS and low %iron, normal B12 Recheck CBC, iron studies Recommend EGD given her history of iron deficiency anemia, gastric biopsies to be performed   Follow up in 76months or sooner if diarrhea recurs  Kendra Darby, MD

## 2019-06-25 ENCOUNTER — Telehealth: Payer: Self-pay | Admitting: Gastroenterology

## 2019-06-25 NOTE — Telephone Encounter (Signed)
Pt left vm she is a pt of Dr. Verlin Grills and is trying to get in touch with Sharyn Lull

## 2019-06-26 ENCOUNTER — Other Ambulatory Visit
Admission: RE | Admit: 2019-06-26 | Discharge: 2019-06-26 | Disposition: A | Discharge status: Home / Self Care | Payer: Medicare HMO | Source: Ambulatory Visit | Attending: Gastroenterology | Admitting: Gastroenterology

## 2019-06-26 ENCOUNTER — Other Ambulatory Visit: Payer: Self-pay

## 2019-06-26 DIAGNOSIS — K912 Postsurgical malabsorption, not elsewhere classified: Secondary | ICD-10-CM

## 2019-06-26 DIAGNOSIS — Z20828 Contact with and (suspected) exposure to other viral communicable diseases: Secondary | ICD-10-CM | POA: Insufficient documentation

## 2019-06-26 DIAGNOSIS — Z01812 Encounter for preprocedural laboratory examination: Secondary | ICD-10-CM | POA: Diagnosis present

## 2019-06-27 LAB — COMPREHENSIVE METABOLIC PANEL
ALT: 13 IU/L (ref 0–32)
AST: 34 IU/L (ref 0–40)
Albumin/Globulin Ratio: 1.5 (ref 1.2–2.2)
Albumin: 4.1 g/dL (ref 3.7–4.7)
Alkaline Phosphatase: 71 IU/L (ref 39–117)
BUN/Creatinine Ratio: 16 (ref 12–28)
BUN: 21 mg/dL (ref 8–27)
Bilirubin Total: 0.7 mg/dL (ref 0.0–1.2)
CO2: 23 mmol/L (ref 20–29)
Calcium: 9.5 mg/dL (ref 8.7–10.3)
Chloride: 107 mmol/L — ABNORMAL HIGH (ref 96–106)
Creatinine, Ser: 1.35 mg/dL — ABNORMAL HIGH (ref 0.57–1.00)
GFR calc Af Amer: 45 mL/min/{1.73_m2} — ABNORMAL LOW (ref >59)
GFR calc non Af Amer: 39 mL/min/{1.73_m2} — ABNORMAL LOW (ref >59)
Globulin, Total: 2.7 g/dL (ref 1.5–4.5)
Glucose: 113 mg/dL — ABNORMAL HIGH (ref 65–99)
Potassium: 3.8 mmol/L (ref 3.5–5.2)
Sodium: 142 mmol/L (ref 134–144)
Total Protein: 6.8 g/dL (ref 6.0–8.5)

## 2019-06-27 LAB — SARS CORONAVIRUS 2 (TAT 6-24 HRS): SARS Coronavirus 2: NEGATIVE

## 2019-06-27 LAB — CBC
Hematocrit: 34.5 % (ref 34.0–46.6)
Hemoglobin: 11.1 g/dL (ref 11.1–15.9)
MCH: 27.5 pg (ref 26.6–33.0)
MCHC: 32.2 g/dL (ref 31.5–35.7)
MCV: 85 fL (ref 79–97)
Platelets: 342 10*3/uL (ref 150–450)
RBC: 4.04 x10E6/uL (ref 3.77–5.28)
RDW: 15 % (ref 11.7–15.4)
WBC: 7.8 10*3/uL (ref 3.4–10.8)

## 2019-06-27 LAB — VITAMIN B12: Vitamin B-12: 751 pg/mL (ref 232–1245)

## 2019-06-27 LAB — IRON AND TIBC
Iron Saturation: 16 % (ref 15–55)
Iron: 68 ug/dL (ref 27–139)
Total Iron Binding Capacity: 418 ug/dL (ref 250–450)
UIBC: 350 ug/dL (ref 118–369)

## 2019-06-27 LAB — FERRITIN: Ferritin: 46 ng/mL (ref 15–150)

## 2019-06-28 ENCOUNTER — Other Ambulatory Visit: Payer: Self-pay | Admitting: Gastroenterology

## 2019-06-28 ENCOUNTER — Encounter: Payer: Self-pay | Admitting: *Deleted

## 2019-06-28 DIAGNOSIS — N179 Acute kidney failure, unspecified: Secondary | ICD-10-CM

## 2019-06-29 ENCOUNTER — Encounter: Payer: Self-pay | Admitting: Certified Registered Nurse Anesthetist

## 2019-06-29 ENCOUNTER — Other Ambulatory Visit: Payer: Self-pay

## 2019-06-29 ENCOUNTER — Ambulatory Visit: Payer: Medicare HMO | Admitting: Certified Registered Nurse Anesthetist

## 2019-06-29 ENCOUNTER — Ambulatory Visit
Admission: RE | Admit: 2019-06-29 | Discharge: 2019-06-29 | Disposition: A | Discharge status: Home / Self Care | Payer: Medicare HMO | Attending: Gastroenterology | Admitting: Gastroenterology

## 2019-06-29 ENCOUNTER — Encounter
Admission: RE | Disposition: A | Discharge status: Home / Self Care | Payer: Self-pay | Source: Home / Self Care | Attending: Gastroenterology

## 2019-06-29 DIAGNOSIS — Z90710 Acquired absence of both cervix and uterus: Secondary | ICD-10-CM | POA: Insufficient documentation

## 2019-06-29 DIAGNOSIS — K3189 Other diseases of stomach and duodenum: Secondary | ICD-10-CM

## 2019-06-29 DIAGNOSIS — D132 Benign neoplasm of duodenum: Secondary | ICD-10-CM | POA: Insufficient documentation

## 2019-06-29 DIAGNOSIS — K449 Diaphragmatic hernia without obstruction or gangrene: Secondary | ICD-10-CM | POA: Insufficient documentation

## 2019-06-29 DIAGNOSIS — R011 Cardiac murmur, unspecified: Secondary | ICD-10-CM | POA: Diagnosis not present

## 2019-06-29 DIAGNOSIS — D509 Iron deficiency anemia, unspecified: Secondary | ICD-10-CM | POA: Diagnosis not present

## 2019-06-29 DIAGNOSIS — Z952 Presence of prosthetic heart valve: Secondary | ICD-10-CM | POA: Diagnosis not present

## 2019-06-29 DIAGNOSIS — Z79899 Other long term (current) drug therapy: Secondary | ICD-10-CM | POA: Insufficient documentation

## 2019-06-29 DIAGNOSIS — Z85038 Personal history of other malignant neoplasm of large intestine: Secondary | ICD-10-CM | POA: Diagnosis not present

## 2019-06-29 DIAGNOSIS — Z7982 Long term (current) use of aspirin: Secondary | ICD-10-CM | POA: Insufficient documentation

## 2019-06-29 DIAGNOSIS — Z8541 Personal history of malignant neoplasm of cervix uteri: Secondary | ICD-10-CM | POA: Diagnosis not present

## 2019-06-29 DIAGNOSIS — I509 Heart failure, unspecified: Secondary | ICD-10-CM | POA: Diagnosis not present

## 2019-06-29 DIAGNOSIS — I110 Hypertensive heart disease with heart failure: Secondary | ICD-10-CM | POA: Insufficient documentation

## 2019-06-29 DIAGNOSIS — Z803 Family history of malignant neoplasm of breast: Secondary | ICD-10-CM | POA: Diagnosis not present

## 2019-06-29 DIAGNOSIS — Z8673 Personal history of transient ischemic attack (TIA), and cerebral infarction without residual deficits: Secondary | ICD-10-CM | POA: Insufficient documentation

## 2019-06-29 DIAGNOSIS — H8110 Benign paroxysmal vertigo, unspecified ear: Secondary | ICD-10-CM | POA: Diagnosis not present

## 2019-06-29 DIAGNOSIS — K219 Gastro-esophageal reflux disease without esophagitis: Secondary | ICD-10-CM | POA: Insufficient documentation

## 2019-06-29 DIAGNOSIS — Z8249 Family history of ischemic heart disease and other diseases of the circulatory system: Secondary | ICD-10-CM | POA: Insufficient documentation

## 2019-06-29 HISTORY — PX: ESOPHAGOGASTRODUODENOSCOPY (EGD) WITH PROPOFOL: SHX5813

## 2019-06-29 SURGERY — ESOPHAGOGASTRODUODENOSCOPY (EGD) WITH PROPOFOL
Anesthesia: General

## 2019-06-29 MED ORDER — PROPOFOL 500 MG/50ML IV EMUL
INTRAVENOUS | Status: DC | PRN
  Administered 2019-06-29: 175 ug/kg/min via INTRAVENOUS

## 2019-06-29 MED ORDER — PROPOFOL 500 MG/50ML IV EMUL
INTRAVENOUS | Status: AC
  Filled 2019-06-29: qty 50

## 2019-06-29 MED ORDER — SODIUM CHLORIDE 0.9 % IV SOLN
INTRAVENOUS | Status: DC
  Administered 2019-06-29: 08:00:00 via INTRAVENOUS

## 2019-06-29 MED ORDER — LIDOCAINE HCL (CARDIAC) PF 100 MG/5ML IV SOSY
PREFILLED_SYRINGE | INTRAVENOUS | Status: DC | PRN
  Administered 2019-06-29: 50 mg via INTRAVENOUS

## 2019-06-29 MED ORDER — PROPOFOL 10 MG/ML IV BOLUS
INTRAVENOUS | Status: DC | PRN
  Administered 2019-06-29: 50 mg via INTRAVENOUS

## 2019-06-29 NOTE — Transfer of Care (Signed)
Immediate Anesthesia Transfer of Care Note  Patient: Kendra Ritter  Procedure(s) Performed: ESOPHAGOGASTRODUODENOSCOPY (EGD) WITH PROPOFOL (N/A )  Patient Location: PACU  Anesthesia Type:General  Level of Consciousness: awake and alert   Airway & Oxygen Therapy: Patient Spontanous Breathing and Patient connected to nasal cannula oxygen  Post-op Assessment: Report given to RN and Post -op Vital signs reviewed and stable  Post vital signs: Reviewed and stable  Last Vitals:  Vitals Value Taken Time  BP 99/57 06/29/19 0858  Temp    Pulse 59 06/29/19 0900  Resp 15 06/29/19 0900  SpO2 99 % 06/29/19 0900  Vitals shown include unvalidated device data.  Last Pain:  Vitals:   06/29/19 0856  TempSrc: Tympanic  PainSc: 0-No pain         Complications: No apparent anesthesia complications

## 2019-06-29 NOTE — Op Note (Signed)
[facility] Patient Name: Kendra Ritter Procedure Date: 06/29/2019 7:30 AM MRN: [id number] Account #: [id number] Date of Birth: [birth year] Admit Type: Outpatient Age: 73 Room: [facility] ENDO ROOM 1 Gender: Female Note Status: Finalized Procedure:            Upper GI endoscopy Indications:          Suspected upper gastrointestinal bleeding in patient                        with unexplained iron deficiency anemia, Iron                        deficiency anemia with no gastrointestinal bleeding                        source identified during previous colonoscopy Providers:            Lin Landsman MD, MD Referring MD:         Baxter Kail. Rebeca Alert MD, MD (Referring MD) Medicines:            Monitored Anesthesia Care Complications:        No immediate complications. Estimated blood loss: None. Procedure:            Pre-Anesthesia Assessment:                       - Prior to the procedure, a History and Physical was                        performed, and patient medications and allergies were                        reviewed. The patient is competent. The risks and                        benefits of the procedure and the sedation options and                        risks were discussed with the patient. All questions                        were answered and informed consent was obtained.                        Patient identification and proposed procedure were                        verified by the physician, the nurse, the                        anesthesiologist, the anesthetist and the technician in                        the pre-procedure area in the procedure room in the                        endoscopy suite. Mental Status Examination: alert and                        oriented. Airway Examination: normal oropharyngeal  airway and neck mobility. Respiratory Examination:                        clear to auscultation. CV Examination: normal.                         Prophylactic Antibiotics: The patient does not require                        prophylactic antibiotics. Prior Anticoagulants: The                        patient has taken no previous anticoagulant or                        antiplatelet agents except for aspirin. ASA Grade                        Assessment: III - A patient with severe systemic                        disease. After reviewing the risks and benefits, the                        patient was deemed in satisfactory condition to undergo                        the procedure. The anesthesia plan was to use monitored                        anesthesia care (MAC). Immediately prior to                        administration of medications, the patient was                        re-assessed for adequacy to receive sedatives. The                        heart rate, respiratory rate, oxygen saturations, blood                        pressure, adequacy of pulmonary ventilation, and                        response to care were monitored throughout the                        procedure. The physical status of the patient was                        re-assessed after the procedure.                       After obtaining informed consent, the endoscope was                        passed under direct vision. Throughout the procedure,                        the patient's blood pressure,  pulse, and oxygen                        saturations were monitored continuously. The Endoscope                        was introduced through the mouth, and advanced to the                        second part of duodenum. The upper GI endoscopy was                        accomplished without difficulty. The patient tolerated                        the procedure well. Findings:      A 5m circular area of pink flat, atrophic mucosa, with surround normal       small bowel mucosa with villi, with no bleeding was found in the second       portion of the  duodenum along the duodenal sweep. Biopsies were taken       with a cold forceps for histology.      The entire examined stomach was normal. Biopsies were taken with a cold       forceps for Helicobacter pylori testing.      A small hiatal hernia was present.      The cardia and gastric fundus were normal on retroflexion.      The gastroesophageal junction and examined esophagus were normal. Impression:           - A single spot with no bleeding in the duodenum.                        Biopsied.                       - Normal stomach. Biopsied.                       - Small hiatal hernia.                       - Normal gastroesophageal junction and esophagus. Recommendation:       - Discharge patient to home (with escort).                       - Resume previous diet today.                       - Continue present medications.                       - Await pathology results.                       - Return to my office as previously scheduled. Procedure Code(s):    --- Professional ---                       [id number] Esophagogastroduodenoscopy, flexible, transoral;                        with biopsy, single or multiple Diagnosis Code(s):    ---  Professional ---                       K31.89, Other diseases of stomach and duodenum                       K44.9, Diaphragmatic hernia without obstruction or                        gangrene                       D50.9, Iron deficiency anemia, unspecified CPT copyright 2019 American Medical Association. All rights reserved. The codes documented in this report are preliminary and upon coder review may  be revised to meet current compliance requirements. Dr. Ulyess Mort Lin Landsman MD, MD 06/29/2019 8:56:18 AM This report has been signed electronically. Number of Addenda: 0 Note Initiated On: 06/29/2019 7:30 AM Estimated Blood Loss: Estimated blood loss: none.      [facility]

## 2019-06-29 NOTE — Anesthesia Preprocedure Evaluation (Signed)
Anesthesia Evaluation  Patient identified by MRN, date of birth, ID band Patient awake    Reviewed: Allergy & Precautions, H&P , NPO status , Patient's Chart, lab work & pertinent test results, reviewed documented beta blocker date and time   History of Anesthesia Complications Negative for: history of anesthetic complications  Airway Mallampati: I  TM Distance: >3 FB Neck ROM: full    Dental  (+) Dental Advidsory Given, Partial Upper, Missing   Pulmonary neg shortness of breath, sleep apnea , neg COPD, neg recent URI,    Pulmonary exam normal        Cardiovascular Exercise Tolerance: Good hypertension, (-) angina+CHF  (-) Past MI and (-) Cardiac Stents Normal cardiovascular exam(-) dysrhythmias + Valvular Problems/Murmurs (s/p AVR)      Neuro/Psych neg Seizures TIACVA, No Residual Symptoms negative psych ROS   GI/Hepatic Neg liver ROS, GERD  ,  Endo/Other  negative endocrine ROS  Renal/GU Renal disease  negative genitourinary   Musculoskeletal   Abdominal   Peds  Hematology negative hematology ROS (+)   Anesthesia Other Findings Past Medical History: No date: Anemia No date: BPPV (benign paroxysmal positional vertigo) No date: CHF (congestive heart failure) ([place]) 1998: Colon cancer (HCC) No date: GERD (gastroesophageal reflux disease) No date: H/O aortic valve replacement No date: Heart murmur No date: Hx of cervical cancer No date: Hypertension No date: Stroke [facility])     Comment:  multiple strokes, 2010, 2011, 2013    Reproductive/Obstetrics negative OB ROS                             Anesthesia Physical Anesthesia Plan  ASA: III  Anesthesia Plan: General   Post-op Pain Management:    Induction: Intravenous  PONV Risk Score and Plan: 3 and Propofol infusion and TIVA  Airway Management Planned: Natural Airway and Nasal Cannula  Additional Equipment:   Intra-op Plan:    Post-operative Plan:   Informed Consent: I have reviewed the patients History and Physical, chart, labs and discussed the procedure including the risks, benefits and alternatives for the proposed anesthesia with the patient or authorized representative who has indicated his/her understanding and acceptance.     Dental Advisory Given  Plan Discussed with: Anesthesiologist, CRNA and Surgeon  Anesthesia Plan Comments:         Anesthesia Quick Evaluation

## 2019-06-29 NOTE — Anesthesia Post-op Follow-up Note (Signed)
Anesthesia QCDR form completed.        

## 2019-06-29 NOTE — Anesthesia Postprocedure Evaluation (Deleted)
Anesthesia Post Note  Patient: Kendra Ritter  Procedure(s) Performed: ESOPHAGOGASTRODUODENOSCOPY (EGD) WITH PROPOFOL (N/A )  Patient location during evaluation: PACU Anesthesia Type: General Level of consciousness: awake and alert Pain management: pain level controlled Vital Signs Assessment: post-procedure vital signs reviewed and stable Respiratory status: spontaneous breathing, nonlabored ventilation, respiratory function stable and patient connected to nasal cannula oxygen Cardiovascular status: blood pressure returned to baseline and stable Postop Assessment: no apparent nausea or vomiting Anesthetic complications: no     Last Vitals:  Vitals:   06/29/19 0906 06/29/19 0916  BP: (!) 144/73 (!) 157/80  Pulse: 60 (!) 59  Resp: 15 13  Temp:    SpO2: 97% 100%    Last Pain:  Vitals:   06/29/19 0916  TempSrc:   PainSc: 0-No pain                 Martha Clan

## 2019-06-29 NOTE — H&P (Signed)
Cephas Darby, MD [address]  [place]  [place], [place] [address]  Main: [phone]  Fax: [phone] Pager: [phone]  Primary Care Physician:  Letta Median, MD Primary Gastroenterologist:  Dr. Cephas Darby  Pre-Procedure History & Physical: HPI:  Kendra Ritter is a 73 y.o. female is here for an endoscopy.   Past Medical History:  Diagnosis Date  . Anemia   . BPPV (benign paroxysmal positional vertigo)   . CHF (congestive heart failure) ([place])   . Colon cancer ([place]) 1998  . GERD (gastroesophageal reflux disease)   . H/O aortic valve replacement   . Heart murmur   . Hx of cervical cancer   . Hypertension   . Stroke [facility])    multiple strokes, 2010, 2011, 2013     Past Surgical History:  Procedure Laterality Date  . AORTIC VALVE REPLACEMENT  2008   [facility]  . APPENDECTOMY    . CARDIAC CATHETERIZATION  2008   [facility]  . COLONOSCOPY WITH ESOPHAGOGASTRODUODENOSCOPY (EGD)    . COLOSTOMY  1998   After colon cancer   . partial hysterectomy     cervical cancer    Prior to Admission medications   Medication Sig Start Date End Date Taking? Authorizing Provider  amLODipine (NORVASC) 10 MG tablet Take 1 tablet (10 mg total) by mouth daily. 04/10/14  Yes Minna Merritts, MD  aspirin EC 81 MG EC tablet Take 1 tablet (81 mg total) by mouth daily. 08/24/16  Yes Gouru, Illene Silver, MD  carvedilol (COREG) 25 MG tablet Take 25 mg by mouth 2 (two) times daily with a meal.   Yes [provider]  cloNIDine (CATAPRES) 0.1 MG tablet Take 0.1 mg by mouth daily.  07/19/17  Yes [provider]  Cyanocobalamin (B-12) 2500 MCG SUBL Place 2,500 mcg under the tongue daily.   Yes [provider]  diphenoxylate-atropine (LOMOTIL) 2.5-0.025 MG tablet TAKE 2 TABLETS BY MOUTH FOUR TIMES DAILY AS NEEDED FOR DIARRHEA OR LOOSE STOOLS 06/20/19  Yes Anaid Haney, Tally Due, MD  ferrous sulfate 325 (65 FE) MG tablet TAKE 1 TABLET (325 MG TOTAL) BY MOUTH 2 (TWO) TIMES DAILY  WITH A MEAL. 08/14/18  Yes Eugene Zeiders, Tally Due, MD  gabapentin (NEURONTIN) 300 MG capsule Take 300 mg by mouth 3 (three) times daily.  01/29/14  Yes [provider]  hydrALAZINE (APRESOLINE) 25 MG tablet Take 25 mg by mouth 3 (three) times daily. 07/31/18  Yes [provider]  latanoprost (XALATAN) 0.005 % ophthalmic solution Place 1 drop into both eyes at bedtime. 04/22/16  Yes [provider]  losartan (COZAAR) 100 MG tablet  11/18/18  Yes [provider]  Omega-3 Fatty Acids (FISH OIL) 1000 MG CAPS Take 1,000 mg by mouth daily.   Yes [provider]  omeprazole (PRILOSEC) 40 MG capsule  04/25/19  Yes [provider]  ondansetron (ZOFRAN ODT) 4 MG disintegrating tablet Take 1 tablet (4 mg total) by mouth every 8 (eight) hours as needed. 11/14/18  Yes Veronese, [place], MD  Potassium 99 MG TABS Take 99 mg by mouth daily.   Yes [provider]  potassium chloride (K-DUR) 10 MEQ tablet  04/25/19  Yes [provider]  prednisoLONE acetate (PRED FORTE) 1 % ophthalmic suspension APPLY TO THE OPERATIVE EYE, 1 DROP FOUR TIMES A DAY 01/03/19  Yes [provider]  spironolactone (ALDACTONE) 25 MG tablet TAKE ONE TABLET BY MOUTH DAILY- TO REPLACE HCTZ 08/02/17  Yes [provider]  triamcinolone ointment (KENALOG) 0.1 %  04/24/19  Yes [provider]    Allergies as of 06/21/2019 - Review Complete 06/21/2019  Allergen Reaction Noted  . Lisinopril Anaphylaxis and Cough 04/10/2014    Family History  Problem Relation Age of Onset  . Heart disease Mother   . Heart attack Mother   . Hypertension Mother   . Breast cancer Maternal Aunt     Social History   Socioeconomic History  . Marital status: Married    Spouse name: Not on file  . Number of children: Not on file  . Years of education: Not on file  . Highest education level: Not on file  Occupational History  . Not on file  Social Needs  . Financial  resource strain: Not on file  . Food insecurity    Worry: Not on file    Inability: Not on file  . Transportation needs    Medical: Not on file    Non-medical: Not on file  Tobacco Use  . Smoking status: Never Smoker  . Smokeless tobacco: Never Used  Substance and Sexual Activity  . Alcohol use: No  . Drug use: No  . Sexual activity: Not on file  Lifestyle  . Physical activity    Days per week: Not on file    Minutes per session: Not on file  . Stress: Not on file  Relationships  . Social Herbalist on phone: Not on file    Gets together: Not on file    Attends religious service: Not on file    Active member of club or organization: Not on file    Attends meetings of clubs or organizations: Not on file    Relationship status: Not on file  . Intimate partner violence    Fear of current or ex partner: Not on file    Emotionally abused: Not on file    Physically abused: Not on file    Forced sexual activity: Not on file  Other Topics Concern  . Not on file  Social History Narrative  . Not on file    Review of Systems: See HPI, otherwise negative ROS  Physical Exam: BP (!) 193/90   Pulse 66   Temp (!) 96.6 F (35.9 C) (Tympanic)   Resp 16   Ht 5\' 3"  (1.6 m)   Wt 69.4 kg   SpO2 100%   BMI 27.10 kg/m  General:   Alert,  pleasant and cooperative in NAD Head:  Normocephalic and atraumatic. Neck:  Supple; no masses or thyromegaly. Lungs:  Clear throughout to auscultation.    Heart:  Regular rate and rhythm. Abdomen:  Soft, nontender and nondistended. Normal bowel sounds, without guarding, and without rebound.   Neurologic:  Alert and  oriented x4;  grossly normal neurologically.  Impression/Plan: Kendra Ritter is here for an endoscopy to be performed for IDA  Risks, benefits, limitations, and alternatives regarding  endoscopy have been reviewed with the patient.  Questions have been answered.  All parties agreeable.   Sherri Sear, MD  06/29/2019,  8:36 AM

## 2019-06-29 NOTE — Anesthesia Postprocedure Evaluation (Signed)
Anesthesia Post Note  Patient: Kendra Ritter  Procedure(s) Performed: ESOPHAGOGASTRODUODENOSCOPY (EGD) WITH PROPOFOL (N/A )  Patient location during evaluation: Endoscopy Anesthesia Type: General Level of consciousness: awake and alert Pain management: pain level controlled Vital Signs Assessment: post-procedure vital signs reviewed and stable Respiratory status: spontaneous breathing, nonlabored ventilation, respiratory function stable and patient connected to nasal cannula oxygen Cardiovascular status: blood pressure returned to baseline and stable Postop Assessment: no apparent nausea or vomiting Anesthetic complications: no     Last Vitals:  Vitals:   06/29/19 0906 06/29/19 0916  BP: (!) 144/73 (!) 157/80  Pulse: 60 (!) 59  Resp: 15 13  Temp:    SpO2: 97% 100%    Last Pain:  Vitals:   06/29/19 0916  TempSrc:   PainSc: 0-No pain                 Martha Clan

## 2019-06-29 NOTE — Anesthesia Procedure Notes (Signed)
Date/Time: 06/29/2019 8:38 AM Performed by: Johnna Acosta, CRNA Pre-anesthesia Checklist: Suction available, Patient being monitored and Timeout performed Patient Re-evaluated:Patient Re-evaluated prior to induction Oxygen Delivery Method: Nasal cannula Preoxygenation: Pre-oxygenation with 100% oxygen Induction Type: IV induction

## 2019-07-02 ENCOUNTER — Other Ambulatory Visit: Payer: Self-pay

## 2019-07-02 ENCOUNTER — Encounter: Payer: Self-pay | Admitting: Gastroenterology

## 2019-07-02 DIAGNOSIS — N179 Acute kidney failure, unspecified: Secondary | ICD-10-CM

## 2019-07-02 LAB — SURGICAL PATHOLOGY

## 2019-07-04 ENCOUNTER — Telehealth: Payer: Self-pay

## 2019-07-04 NOTE — Telephone Encounter (Signed)
Patient states she will come get lab work tomorrow at our office

## 2019-07-04 NOTE — Telephone Encounter (Signed)
-----   Message from Lin Landsman, MD sent at 07/04/2019 11:29 AM EDT ----- Please remind her about blood work that she has to get to assess her kidney function

## 2019-07-04 NOTE — Telephone Encounter (Signed)
Called and left a message on both numbers. Patient needs to come in to get lab work

## 2019-07-04 NOTE — Telephone Encounter (Signed)
Patient is returning call to [place].  

## 2019-07-06 LAB — BUN+CREAT
BUN/Creatinine Ratio: 16 (ref 12–28)
BUN: 16 mg/dL (ref 8–27)
Creatinine, Ser: 0.99 mg/dL (ref 0.57–1.00)
GFR calc Af Amer: 66 mL/min/{1.73_m2} (ref >59)
GFR calc non Af Amer: 57 mL/min/{1.73_m2} — ABNORMAL LOW (ref >59)

## 2019-07-17 ENCOUNTER — Telehealth: Payer: Self-pay

## 2019-07-17 ENCOUNTER — Other Ambulatory Visit: Payer: Self-pay

## 2019-07-17 DIAGNOSIS — D132 Benign neoplasm of duodenum: Secondary | ICD-10-CM

## 2019-07-17 NOTE — Telephone Encounter (Signed)
Patient verbalized understanding. Will scheduled procedure for 08/03/2019. Went over instructions with patient and mailed instructions

## 2019-07-17 NOTE — Telephone Encounter (Signed)
-----   Message from Lin Landsman, MD sent at 07/16/2019  5:18 PM EDT ----- Recommend to schedule upper endoscopy for complete resection of duodenal adenoma in next 2-3 weeks Dx: Duodenal adenoma Results released via my chart  Rohini Vanga

## 2019-07-20 ENCOUNTER — Telehealth: Payer: Self-pay | Admitting: Gastroenterology

## 2019-07-20 NOTE — Telephone Encounter (Signed)
Explained to patient that she would be having a upper endoscopy where they would be looking in her upper GI to make sure there no bleeding, damage from gerd or polyps.

## 2019-07-20 NOTE — Telephone Encounter (Signed)
Pt left vm she is unclear about her procedure on 08/03/19 she is unclear about what Dr. Marius Ditch will do to her that day

## 2019-07-24 ENCOUNTER — Telehealth: Payer: Self-pay | Admitting: Gastroenterology

## 2019-07-24 NOTE — Telephone Encounter (Signed)
Patient called & has question about her procedure with DR Marius Ditch on 08-03-19.Please advise.

## 2019-07-24 NOTE — Telephone Encounter (Signed)
Spoke with pt and confirmed EGD scheduled for 08/03/2019, pt verbalized understanding

## 2019-07-31 ENCOUNTER — Other Ambulatory Visit
Admission: RE | Admit: 2019-07-31 | Discharge: 2019-07-31 | Disposition: A | Discharge status: Home / Self Care | Payer: Medicare HMO | Source: Ambulatory Visit | Attending: Gastroenterology | Admitting: Gastroenterology

## 2019-07-31 ENCOUNTER — Other Ambulatory Visit: Payer: Self-pay

## 2019-07-31 DIAGNOSIS — Z20828 Contact with and (suspected) exposure to other viral communicable diseases: Secondary | ICD-10-CM | POA: Diagnosis not present

## 2019-07-31 DIAGNOSIS — Z01812 Encounter for preprocedural laboratory examination: Secondary | ICD-10-CM | POA: Insufficient documentation

## 2019-07-31 LAB — SARS CORONAVIRUS 2 (TAT 6-24 HRS): SARS Coronavirus 2: NEGATIVE

## 2019-08-03 ENCOUNTER — Encounter: Payer: Self-pay | Admitting: *Deleted

## 2019-08-03 ENCOUNTER — Ambulatory Visit: Payer: Medicare HMO | Admitting: Certified Registered Nurse Anesthetist

## 2019-08-03 ENCOUNTER — Other Ambulatory Visit: Payer: Self-pay

## 2019-08-03 ENCOUNTER — Encounter
Admission: RE | Disposition: A | Discharge status: Home / Self Care | Payer: Self-pay | Source: Home / Self Care | Attending: Gastroenterology

## 2019-08-03 ENCOUNTER — Ambulatory Visit
Admission: RE | Admit: 2019-08-03 | Discharge: 2019-08-03 | Disposition: A | Discharge status: Home / Self Care | Payer: Medicare HMO | Attending: Gastroenterology | Admitting: Gastroenterology

## 2019-08-03 DIAGNOSIS — Z79899 Other long term (current) drug therapy: Secondary | ICD-10-CM | POA: Insufficient documentation

## 2019-08-03 DIAGNOSIS — Z8673 Personal history of transient ischemic attack (TIA), and cerebral infarction without residual deficits: Secondary | ICD-10-CM | POA: Diagnosis not present

## 2019-08-03 DIAGNOSIS — Z888 Allergy status to other drugs, medicaments and biological substances status: Secondary | ICD-10-CM | POA: Diagnosis not present

## 2019-08-03 DIAGNOSIS — K219 Gastro-esophageal reflux disease without esophagitis: Secondary | ICD-10-CM | POA: Insufficient documentation

## 2019-08-03 DIAGNOSIS — Z85038 Personal history of other malignant neoplasm of large intestine: Secondary | ICD-10-CM | POA: Diagnosis not present

## 2019-08-03 DIAGNOSIS — G4730 Sleep apnea, unspecified: Secondary | ICD-10-CM | POA: Insufficient documentation

## 2019-08-03 DIAGNOSIS — D132 Benign neoplasm of duodenum: Secondary | ICD-10-CM | POA: Diagnosis not present

## 2019-08-03 DIAGNOSIS — Z8249 Family history of ischemic heart disease and other diseases of the circulatory system: Secondary | ICD-10-CM | POA: Insufficient documentation

## 2019-08-03 DIAGNOSIS — Z7982 Long term (current) use of aspirin: Secondary | ICD-10-CM | POA: Diagnosis not present

## 2019-08-03 DIAGNOSIS — I509 Heart failure, unspecified: Secondary | ICD-10-CM | POA: Diagnosis not present

## 2019-08-03 DIAGNOSIS — D631 Anemia in chronic kidney disease: Secondary | ICD-10-CM | POA: Insufficient documentation

## 2019-08-03 DIAGNOSIS — I130 Hypertensive heart and chronic kidney disease with heart failure and stage 1 through stage 4 chronic kidney disease, or unspecified chronic kidney disease: Secondary | ICD-10-CM | POA: Diagnosis not present

## 2019-08-03 DIAGNOSIS — K317 Polyp of stomach and duodenum: Secondary | ICD-10-CM | POA: Diagnosis not present

## 2019-08-03 DIAGNOSIS — N189 Chronic kidney disease, unspecified: Secondary | ICD-10-CM | POA: Diagnosis not present

## 2019-08-03 DIAGNOSIS — Z8541 Personal history of malignant neoplasm of cervix uteri: Secondary | ICD-10-CM | POA: Insufficient documentation

## 2019-08-03 HISTORY — DX: Chronic kidney disease, unspecified: N18.9

## 2019-08-03 HISTORY — DX: Sleep apnea, unspecified: G47.30

## 2019-08-03 HISTORY — PX: ESOPHAGOGASTRODUODENOSCOPY (EGD) WITH PROPOFOL: SHX5813

## 2019-08-03 HISTORY — DX: Malignant neoplasm of cervix uteri, unspecified: C53.9

## 2019-08-03 SURGERY — ESOPHAGOGASTRODUODENOSCOPY (EGD) WITH PROPOFOL
Anesthesia: General

## 2019-08-03 MED ORDER — PROPOFOL 10 MG/ML IV BOLUS
INTRAVENOUS | Status: DC | PRN
  Administered 2019-08-03: 50 mg via INTRAVENOUS

## 2019-08-03 MED ORDER — SODIUM CHLORIDE 0.9 % IV SOLN
INTRAVENOUS | Status: DC
  Administered 2019-08-03: 08:00:00 via INTRAVENOUS

## 2019-08-03 MED ORDER — PROPOFOL 10 MG/ML IV BOLUS
INTRAVENOUS | Status: AC
  Filled 2019-08-03: qty 20

## 2019-08-03 MED ORDER — LIDOCAINE HCL (PF) 2 % IJ SOLN
INTRAMUSCULAR | Status: AC
  Filled 2019-08-03: qty 10

## 2019-08-03 MED ORDER — PROPOFOL 500 MG/50ML IV EMUL
INTRAVENOUS | Status: DC | PRN
  Administered 2019-08-03: 150 ug/kg/min via INTRAVENOUS

## 2019-08-03 MED ORDER — LIDOCAINE HCL (CARDIAC) PF 100 MG/5ML IV SOSY
PREFILLED_SYRINGE | INTRAVENOUS | Status: DC | PRN
  Administered 2019-08-03: 100 mg via INTRATRACHEAL

## 2019-08-03 NOTE — H&P (Signed)
Cephas Darby, MD [address]  [place]  [place], [place] [address]  Main: [phone]  Fax: [phone] Pager: [phone]  Primary Care Physician:  Letta Median, MD Primary Gastroenterologist:  Dr. Cephas Darby  Pre-Procedure History & Physical: HPI:  Kendra Ritter is a 73 y.o. female is here for an endoscopy.   Past Medical History:  Diagnosis Date  . Anemia   . BPPV (benign paroxysmal positional vertigo)   . Cervical ca ([place])   . CHF (congestive heart failure) ([place])   . Chronic kidney disease   . Colon cancer ([place]) 1998  . GERD (gastroesophageal reflux disease)   . H/O aortic valve replacement   . Heart murmur   . Hx of cervical cancer   . Hypertension   . Sleep apnea   . Stroke [facility])    multiple strokes, 2010, 2011, 2013     Past Surgical History:  Procedure Laterality Date  . AORTIC VALVE REPLACEMENT  2008   [facility]  . APPENDECTOMY    . CARDIAC CATHETERIZATION  2008   [facility]  . COLON SURGERY     colostoma bag  . COLONOSCOPY WITH ESOPHAGOGASTRODUODENOSCOPY (EGD)    . COLOSTOMY  1998   After colon cancer   . ESOPHAGOGASTRODUODENOSCOPY (EGD) WITH PROPOFOL N/A 06/29/2019   Procedure: ESOPHAGOGASTRODUODENOSCOPY (EGD) WITH PROPOFOL;  Surgeon: Lin Landsman, MD;  Location: [facility];  Service: Gastroenterology;  Laterality: N/A;  . partial hysterectomy     cervical cancer    Prior to Admission medications   Medication Sig Start Date End Date Taking? Authorizing Provider  amLODipine (NORVASC) 10 MG tablet Take 1 tablet (10 mg total) by mouth daily. 04/10/14  Yes Minna Merritts, MD  aspirin EC 81 MG EC tablet Take 1 tablet (81 mg total) by mouth daily. 08/24/16  Yes Gouru, Illene Silver, MD  carvedilol (COREG) 25 MG tablet Take 25 mg by mouth 2 (two) times daily with a meal.   Yes [provider]  cloNIDine (CATAPRES) 0.1 MG tablet Take 0.1 mg by mouth daily.  07/19/17  Yes [provider]  Cyanocobalamin (B-12) 2500 MCG SUBL  Place 2,500 mcg under the tongue daily.   Yes [provider]  diphenoxylate-atropine (LOMOTIL) 2.5-0.025 MG tablet TAKE 2 TABLETS BY MOUTH FOUR TIMES DAILY AS NEEDED FOR DIARRHEA OR LOOSE STOOLS 06/20/19  Yes Vanga, Tally Due, MD  ferrous sulfate 325 (65 FE) MG tablet TAKE 1 TABLET (325 MG TOTAL) BY MOUTH 2 (TWO) TIMES DAILY WITH A MEAL. 08/14/18  Yes Vanga, Tally Due, MD  gabapentin (NEURONTIN) 300 MG capsule Take 300 mg by mouth 3 (three) times daily.  01/29/14  Yes [provider]  hydrALAZINE (APRESOLINE) 25 MG tablet Take 25 mg by mouth 3 (three) times daily. 07/31/18  Yes [provider]  latanoprost (XALATAN) 0.005 % ophthalmic solution Place 1 drop into both eyes at bedtime. 04/22/16  Yes [provider]  losartan (COZAAR) 100 MG tablet  11/18/18  Yes [provider]  Omega-3 Fatty Acids (FISH OIL) 1000 MG CAPS Take 1,000 mg by mouth daily.   Yes [provider]  omeprazole (PRILOSEC) 40 MG capsule  04/25/19  Yes [provider]  ondansetron (ZOFRAN ODT) 4 MG disintegrating tablet Take 1 tablet (4 mg total) by mouth every 8 (eight) hours as needed. 11/14/18  Yes Veronese, [place], MD  Potassium 99 MG TABS Take 99 mg by mouth daily.   Yes [provider]  potassium chloride (K-DUR)  10 MEQ tablet  04/25/19  Yes [provider]  prednisoLONE acetate (PRED FORTE) 1 % ophthalmic suspension APPLY TO THE OPERATIVE EYE, 1 DROP FOUR TIMES A DAY 01/03/19  Yes [provider]  spironolactone (ALDACTONE) 25 MG tablet TAKE ONE TABLET BY MOUTH DAILY- TO REPLACE HCTZ 08/02/17  Yes [provider]  triamcinolone ointment (KENALOG) 0.1 %  04/24/19  Yes [provider]    Allergies as of 07/17/2019 - Review Complete 06/29/2019  Allergen Reaction Noted  . Lisinopril Anaphylaxis and Cough 04/10/2014    Family History  Problem Relation Age of Onset  . Heart disease Mother   . Heart attack Mother   .  Hypertension Mother   . Breast cancer Maternal Aunt     Social History   Socioeconomic History  . Marital status: Married    Spouse name: Not on file  . Number of children: Not on file  . Years of education: Not on file  . Highest education level: Not on file  Occupational History  . Not on file  Social Needs  . Financial resource strain: Not on file  . Food insecurity    Worry: Not on file    Inability: Not on file  . Transportation needs    Medical: Not on file    Non-medical: Not on file  Tobacco Use  . Smoking status: Never Smoker  . Smokeless tobacco: Never Used  Substance and Sexual Activity  . Alcohol use: No  . Drug use: No  . Sexual activity: Not on file  Lifestyle  . Physical activity    Days per week: Not on file    Minutes per session: Not on file  . Stress: Not on file  Relationships  . Social Herbalist on phone: Not on file    Gets together: Not on file    Attends religious service: Not on file    Active member of club or organization: Not on file    Attends meetings of clubs or organizations: Not on file    Relationship status: Not on file  . Intimate partner violence    Fear of current or ex partner: Not on file    Emotionally abused: Not on file    Physically abused: Not on file    Forced sexual activity: Not on file  Other Topics Concern  . Not on file  Social History Narrative  . Not on file    Review of Systems: See HPI, otherwise negative ROS  Physical Exam: BP (!) 179/92   Pulse 70   Temp (!) 96 F (35.6 C) (Tympanic)   Resp 18   Ht 5\' 1"  (1.549 m)   Wt 73.5 kg   SpO2 99%   BMI 30.61 kg/m  General:   Alert,  pleasant and cooperative in NAD Head:  Normocephalic and atraumatic. Neck:  Supple; no masses or thyromegaly. Lungs:  Clear throughout to auscultation.    Heart:  Regular rate and rhythm. Abdomen:  Soft, nontender and nondistended. Normal bowel sounds, without guarding, and without rebound.   Neurologic:   Alert and  oriented x4;  grossly normal neurologically.  Impression/Plan: Kendra Ritter is here for an endoscopy to be performed for removal of duodenal adenomas  Risks, benefits, limitations, and alternatives regarding  endoscopy have been reviewed with the patient.  Questions have been answered.  All parties agreeable.   Sherri Sear, MD  08/03/2019, 8:16 AM

## 2019-08-03 NOTE — Anesthesia Post-op Follow-up Note (Signed)
Anesthesia QCDR form completed.        

## 2019-08-03 NOTE — Op Note (Signed)
[facility] Gastroenterology Patient Name: Kendra Ritter Procedure Date: 08/03/2019 7:33 AM MRN: [id number] Account #: [id number] Date of Birth: 1946-05-29 Admit Type: Outpatient Age: 73 Room: [facility] ENDO ROOM 1 Gender: Female Note Status: Finalized Procedure:            Upper GI endoscopy Indications:          For therapy of benign duodenal tumor Providers:            Lin Landsman MD, MD Medicines:            Monitored Anesthesia Care Complications:        No immediate complications. Estimated blood loss: None. Procedure:            Pre-Anesthesia Assessment:                       - Prior to the procedure, a History and Physical was                        performed, and patient medications and allergies were                        reviewed. The patient is competent. The risks and                        benefits of the procedure and the sedation options and                        risks were discussed with the patient. All questions                        were answered and informed consent was obtained.                        Patient identification and proposed procedure were                        verified by the physician, the nurse, the                        anesthesiologist, the anesthetist and the technician in                        the pre-procedure area in the procedure room in the                        endoscopy suite. Mental Status Examination: alert and                        oriented. Airway Examination: normal oropharyngeal                        airway and neck mobility. Respiratory Examination:                        clear to auscultation. CV Examination: normal.                        Prophylactic Antibiotics: The patient does not require  prophylactic antibiotics. Prior Anticoagulants: The                        patient has taken no previous anticoagulant or                        antiplatelet agents. ASA Grade Assessment:  III - A                        patient with severe systemic disease. After reviewing                        the risks and benefits, the patient was deemed in                        satisfactory condition to undergo the procedure. The                        anesthesia plan was to use monitored anesthesia care                        (MAC). Immediately prior to administration of                        medications, the patient was re-assessed for adequacy                        to receive sedatives. The heart rate, respiratory rate,                        oxygen saturations, blood pressure, adequacy of                        pulmonary ventilation, and response to care were                        monitored throughout the procedure. The physical status                        of the patient was re-assessed after the procedure.                       After obtaining informed consent, the endoscope was                        passed under direct vision. Throughout the procedure,                        the patient's blood pressure, pulse, and oxygen                        saturations were monitored continuously. The Endoscope                        was introduced through the mouth, and advanced to the                        second part of duodenum. The upper GI endoscopy was  accomplished without difficulty. The patient tolerated                        the procedure well. Findings:      A single 7 mm sessile polyp with no bleeding was found in the second       portion of the duodenum. The polyp was removed with a hot snare.       Resection and retrieval were complete.      The exam was otherwise without abnormality. Impression:           - A single duodenal polyp. Resected and retrieved.                       - The examination was otherwise normal. Recommendation:       - Discharge patient to home (with escort).                       - Resume previous diet today.                        - Continue present medications.                       - Await pathology results.                       - Return to my office as previously scheduled. 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 All rights reserved. The codes documented in this report are preliminary and upon coder review may  be revised to meet current compliance requirements. Dr. Ulyess Mort Lin Landsman MD, MD 08/03/2019 8:33:10 AM This report has been signed electronically. Number of Addenda: 0 Note Initiated On: 08/03/2019 7:33 AM Estimated Blood Loss: Estimated blood loss: none.      [facility]

## 2019-08-03 NOTE — Anesthesia Postprocedure Evaluation (Signed)
Anesthesia Post Note  Patient: Kendra Ritter  Procedure(s) Performed: ESOPHAGOGASTRODUODENOSCOPY (EGD) WITH PROPOFOL (N/A )  Patient location during evaluation: Endoscopy Anesthesia Type: General Level of consciousness: awake and alert Pain management: pain level controlled Vital Signs Assessment: post-procedure vital signs reviewed and stable Respiratory status: spontaneous breathing, nonlabored ventilation, respiratory function stable and patient connected to nasal cannula oxygen Cardiovascular status: blood pressure returned to baseline and stable Postop Assessment: no apparent nausea or vomiting Anesthetic complications: no     Last Vitals:  Vitals:   08/03/19 0842 08/03/19 0852  BP: 136/72 (!) 153/78  Pulse: 62 60  Resp: 14 20  Temp:    SpO2: 98% 99%    Last Pain:  Vitals:   08/03/19 0852  TempSrc:   PainSc: 0-No pain                 Precious Haws Piscitello

## 2019-08-03 NOTE — Transfer of Care (Signed)
Immediate Anesthesia Transfer of Care Note  Patient: Kendra Ritter  Procedure(s) Performed: ESOPHAGOGASTRODUODENOSCOPY (EGD) WITH PROPOFOL (N/A )  Patient Location: PACU  Anesthesia Type:General  Level of Consciousness: sedated  Airway & Oxygen Therapy: Patient Spontanous Breathing and Patient connected to nasal cannula oxygen  Post-op Assessment: Report given to RN and Post -op Vital signs reviewed and stable  Post vital signs: Reviewed and stable  Last Vitals:  Vitals Value Taken Time  BP 118/71 08/03/19 0832  Temp 36.3 C 08/03/19 0832  Pulse 56 08/03/19 0832  Resp 14 08/03/19 0832  SpO2 100 % 08/03/19 0832    Last Pain:  Vitals:   08/03/19 0832  TempSrc: Temporal  PainSc: Asleep         Complications: No apparent anesthesia complications

## 2019-08-03 NOTE — Anesthesia Preprocedure Evaluation (Signed)
Anesthesia Evaluation  Patient identified by MRN, date of birth, ID band Patient awake    Reviewed: Allergy & Precautions, H&P , NPO status , Patient's Chart, lab work & pertinent test results  History of Anesthesia Complications Negative for: history of anesthetic complications  Airway Mallampati: III  TM Distance: <3 FB Neck ROM: limited    Dental  (+) Chipped, Poor Dentition, Missing   Pulmonary neg shortness of breath, sleep apnea ,           Cardiovascular Exercise Tolerance: Good hypertension, (-) angina+CHF  (-) Past MI + Valvular Problems/Murmurs      Neuro/Psych TIACVA negative psych ROS   GI/Hepatic Neg liver ROS, GERD  ,  Endo/Other  negative endocrine ROS  Renal/GU Renal disease  negative genitourinary   Musculoskeletal   Abdominal   Peds  Hematology negative hematology ROS (+)   Anesthesia Other Findings Past Medical History: No date: Anemia No date: BPPV (benign paroxysmal positional vertigo) No date: Cervical ca (HCC) No date: CHF (congestive heart failure) (HCC) No date: Chronic kidney disease 1998: Colon cancer ([place]) No date: GERD (gastroesophageal reflux disease) No date: H/O aortic valve replacement No date: Heart murmur No date: Hx of cervical cancer No date: Hypertension No date: Sleep apnea No date: Stroke [facility])     Comment:  multiple strokes, 2010, 2011, 2013   Past Surgical History: 2008: AORTIC VALVE REPLACEMENT     Comment:  [facility] No date: APPENDECTOMY 2008: CARDIAC CATHETERIZATION     Comment:  [facility] No date: COLON SURGERY     Comment:  colostoma bag No date: COLONOSCOPY WITH ESOPHAGOGASTRODUODENOSCOPY (EGD) 1998: COLOSTOMY     Comment:  After colon cancer  06/29/2019: ESOPHAGOGASTRODUODENOSCOPY (EGD) WITH PROPOFOL; N/A     Comment:  Procedure: ESOPHAGOGASTRODUODENOSCOPY (EGD) WITH               PROPOFOL;  Surgeon: Lin Landsman, MD;  Location:               [facility]  ENDOSCOPY;  Service: Gastroenterology;  Laterality:               N/A; No date: partial hysterectomy     Comment:  cervical cancer     Reproductive/Obstetrics negative OB ROS                             Anesthesia Physical Anesthesia Plan  ASA: III  Anesthesia Plan: General   Post-op Pain Management:    Induction: Intravenous  PONV Risk Score and Plan: Propofol infusion and TIVA  Airway Management Planned: Natural Airway and Nasal Cannula  Additional Equipment:   Intra-op Plan:   Post-operative Plan:   Informed Consent: I have reviewed the patients History and Physical, chart, labs and discussed the procedure including the risks, benefits and alternatives for the proposed anesthesia with the patient or authorized representative who has indicated his/her understanding and acceptance.     Dental Advisory Given  Plan Discussed with: Anesthesiologist, CRNA and Surgeon  Anesthesia Plan Comments: (Patient consented for risks of anesthesia including but not limited to:  - adverse reactions to medications - risk of intubation if required - damage to teeth, lips or other oral mucosa - sore throat or hoarseness - Damage to heart, brain, lungs or loss of life  Patient voiced understanding.)        Anesthesia Quick Evaluation

## 2019-08-06 ENCOUNTER — Encounter: Payer: Self-pay | Admitting: Gastroenterology

## 2019-08-08 LAB — SURGICAL PATHOLOGY

## 2019-08-13 ENCOUNTER — Telehealth: Payer: Self-pay | Admitting: Gastroenterology

## 2019-08-13 ENCOUNTER — Other Ambulatory Visit: Payer: Self-pay

## 2019-08-13 MED ORDER — DIPHENOXYLATE-ATROPINE 2.5-0.025 MG PO TABS: ORAL_TABLET | ORAL | 0 refills | Status: DC

## 2019-08-13 NOTE — Telephone Encounter (Signed)
PT LEFT VM SHE STATES SHE IS OUT OF RX  diphenoxylate-atropine (LOMOTIL) 2.5-0.025 MG tablet AND NEEDS A REFILL

## 2019-08-13 NOTE — Telephone Encounter (Signed)
Medication has been refilled and faxed to pharmacy, pt has been notified and verbalized understanding

## 2019-10-19 ENCOUNTER — Telehealth: Payer: Self-pay | Admitting: Gastroenterology

## 2019-10-19 NOTE — Telephone Encounter (Signed)
Patient called & would like to have a refill on diphenoxylate-atropine (LOMOTIL) 2.5-0.025 MG tablet called into [facility] on [address] .

## 2019-10-22 ENCOUNTER — Other Ambulatory Visit: Payer: Self-pay

## 2019-10-22 MED ORDER — DIPHENOXYLATE-ATROPINE 2.5-0.025 MG PO TABS: ORAL_TABLET | ORAL | 0 refills | Status: DC

## 2019-10-29 ENCOUNTER — Other Ambulatory Visit: Payer: Self-pay | Admitting: Gastroenterology

## 2019-11-06 ENCOUNTER — Other Ambulatory Visit: Payer: Self-pay | Admitting: Gastroenterology

## 2020-08-08 ENCOUNTER — Other Ambulatory Visit: Payer: Self-pay

## 2020-08-08 ENCOUNTER — Other Ambulatory Visit: Payer: Self-pay | Admitting: Family Medicine

## 2020-08-08 ENCOUNTER — Ambulatory Visit
Admission: RE | Admit: 2020-08-08 | Discharge: 2020-08-08 | Disposition: A | Discharge status: Home / Self Care | Payer: Medicare HMO | Source: Ambulatory Visit | Attending: Family Medicine | Admitting: Family Medicine

## 2020-08-08 ENCOUNTER — Ambulatory Visit
Admission: RE | Admit: 2020-08-08 | Discharge: 2020-08-08 | Disposition: A | Discharge status: Home / Self Care | Payer: Medicare HMO | Attending: Family Medicine | Admitting: Family Medicine

## 2020-08-08 DIAGNOSIS — M542 Cervicalgia: Secondary | ICD-10-CM

## 2020-09-25 ENCOUNTER — Telehealth: Payer: Self-pay | Admitting: Cardiovascular Disease

## 2020-09-25 NOTE — Telephone Encounter (Signed)
3 attempts to schedule fu appt from recall list.   Deleting recall.   

## 2021-04-21 ENCOUNTER — Emergency Department
Admission: EM | Admit: 2021-04-21 | Discharge: 2021-04-21 | Disposition: A | Discharge status: Home / Self Care | Payer: Medicare HMO | Attending: Emergency Medicine | Admitting: Emergency Medicine

## 2021-04-21 ENCOUNTER — Emergency Department: Payer: Medicare HMO

## 2021-04-21 ENCOUNTER — Other Ambulatory Visit: Payer: Self-pay

## 2021-04-21 DIAGNOSIS — I509 Heart failure, unspecified: Secondary | ICD-10-CM | POA: Diagnosis not present

## 2021-04-21 DIAGNOSIS — M545 Low back pain, unspecified: Secondary | ICD-10-CM | POA: Insufficient documentation

## 2021-04-21 DIAGNOSIS — N189 Chronic kidney disease, unspecified: Secondary | ICD-10-CM | POA: Diagnosis not present

## 2021-04-21 DIAGNOSIS — Z79899 Other long term (current) drug therapy: Secondary | ICD-10-CM | POA: Diagnosis not present

## 2021-04-21 DIAGNOSIS — D631 Anemia in chronic kidney disease: Secondary | ICD-10-CM | POA: Diagnosis not present

## 2021-04-21 DIAGNOSIS — Z7982 Long term (current) use of aspirin: Secondary | ICD-10-CM | POA: Insufficient documentation

## 2021-04-21 DIAGNOSIS — M5441 Lumbago with sciatica, right side: Secondary | ICD-10-CM

## 2021-04-21 DIAGNOSIS — Z8541 Personal history of malignant neoplasm of cervix uteri: Secondary | ICD-10-CM | POA: Insufficient documentation

## 2021-04-21 DIAGNOSIS — R55 Syncope and collapse: Secondary | ICD-10-CM | POA: Diagnosis present

## 2021-04-21 DIAGNOSIS — Z85038 Personal history of other malignant neoplasm of large intestine: Secondary | ICD-10-CM | POA: Insufficient documentation

## 2021-04-21 DIAGNOSIS — I130 Hypertensive heart and chronic kidney disease with heart failure and stage 1 through stage 4 chronic kidney disease, or unspecified chronic kidney disease: Secondary | ICD-10-CM | POA: Diagnosis not present

## 2021-04-21 DIAGNOSIS — M79604 Pain in right leg: Secondary | ICD-10-CM | POA: Diagnosis not present

## 2021-04-21 LAB — URINALYSIS, COMPLETE (UACMP) WITH MICROSCOPIC
Bilirubin Urine: NEGATIVE
Glucose, UA: NEGATIVE mg/dL
Ketones, ur: NEGATIVE mg/dL
Leukocytes,Ua: NEGATIVE
Nitrite: NEGATIVE
Protein, ur: NEGATIVE mg/dL
Specific Gravity, Urine: 1.015 (ref 1.005–1.030)
pH: 5 (ref 5.0–8.0)

## 2021-04-21 LAB — BRAIN NATRIURETIC PEPTIDE: B Natriuretic Peptide: 54.1 pg/mL (ref 0.0–100.0)

## 2021-04-21 LAB — TROPONIN I (HIGH SENSITIVITY)
Troponin I (High Sensitivity): 7 ng/L (ref <18)
Troponin I (High Sensitivity): 8 ng/L (ref <18)

## 2021-04-21 LAB — CBC WITH DIFFERENTIAL/PLATELET
Abs Immature Granulocytes: 0.05 10*3/uL (ref 0.00–0.07)
Basophils Absolute: 0 10*3/uL (ref 0.0–0.1)
Basophils Relative: 0 %
Eosinophils Absolute: 0 10*3/uL (ref 0.0–0.5)
Eosinophils Relative: 0 %
HCT: 36.3 % (ref 36.0–46.0)
Hemoglobin: 11.5 g/dL — ABNORMAL LOW (ref 12.0–15.0)
Immature Granulocytes: 1 %
Lymphocytes Relative: 9 %
Lymphs Abs: 1 10*3/uL (ref 0.7–4.0)
MCH: 27.2 pg (ref 26.0–34.0)
MCHC: 31.7 g/dL (ref 30.0–36.0)
MCV: 85.8 fL (ref 80.0–100.0)
Monocytes Absolute: 0.6 10*3/uL (ref 0.1–1.0)
Monocytes Relative: 6 %
Neutro Abs: 9.3 10*3/uL — ABNORMAL HIGH (ref 1.7–7.7)
Neutrophils Relative %: 84 %
Platelets: 354 10*3/uL (ref 150–400)
RBC: 4.23 MIL/uL (ref 3.87–5.11)
RDW: 14.7 % (ref 11.5–15.5)
WBC: 11 10*3/uL — ABNORMAL HIGH (ref 4.0–10.5)
nRBC: 0 % (ref 0.0–0.2)

## 2021-04-21 LAB — MAGNESIUM: Magnesium: 1.9 mg/dL (ref 1.7–2.4)

## 2021-04-21 LAB — COMPREHENSIVE METABOLIC PANEL
ALT: 14 U/L (ref 0–44)
AST: 38 U/L (ref 15–41)
Albumin: 4 g/dL (ref 3.5–5.0)
Alkaline Phosphatase: 67 U/L (ref 38–126)
Anion gap: 8 (ref 5–15)
BUN: 20 mg/dL (ref 8–23)
CO2: 20 mmol/L — ABNORMAL LOW (ref 22–32)
Calcium: 9.5 mg/dL (ref 8.9–10.3)
Chloride: 111 mmol/L (ref 98–111)
Creatinine, Ser: 1.15 mg/dL — ABNORMAL HIGH (ref 0.44–1.00)
GFR, Estimated: 50 mL/min — ABNORMAL LOW (ref >60)
Glucose, Bld: 112 mg/dL — ABNORMAL HIGH (ref 70–99)
Potassium: 3.2 mmol/L — ABNORMAL LOW (ref 3.5–5.1)
Sodium: 139 mmol/L (ref 135–145)
Total Bilirubin: 0.9 mg/dL (ref 0.3–1.2)
Total Protein: 8 g/dL (ref 6.5–8.1)

## 2021-04-21 MED ORDER — METHOCARBAMOL 500 MG PO TABS: 500.0000 mg | ORAL_TABLET | Freq: Four times a day (QID) | ORAL | 0 refills | Status: DC

## 2021-04-21 MED ORDER — PREDNISONE 10 MG PO TABS: 10.0000 mg | ORAL_TABLET | ORAL | 0 refills | Status: DC

## 2021-04-21 MED ORDER — POTASSIUM CHLORIDE CRYS ER 20 MEQ PO TBCR
40.0000 meq | EXTENDED_RELEASE_TABLET | Freq: Once | ORAL | Status: AC
  Administered 2021-04-21: 40 meq via ORAL
  Filled 2021-04-21: qty 2

## 2021-04-21 NOTE — ED Triage Notes (Signed)
Pt c/o right lower back pain that radiates into the leg for the past 3 weeks

## 2021-04-21 NOTE — ED Provider Notes (Signed)
-----------------------------------------   4:07 PM on 04/21/2021 -----------------------------------------  Blood pressure (!) 156/78, pulse 82, temperature 98.4 F (36.9 C), temperature source Oral, resp. rate 17, height 5\' 2"  (1.575 m), weight 68.9 kg, SpO2 100 %.  Assuming care from [facility], PA-C.  In short, Kendra Ritter is a 75 y.o. female with a chief complaint of Back Pain .  Refer to the original H&P for additional details.  The current plan of care is to perform CT scans of the thoracic and lumbar spine.  Patient presented to the ED after syncope and collapse today.  No history of same.  Patient was complaining of low back pain with pain radiating down the leg.  Patient had findings concerning for acute T11 compression fracture on x-ray.  Plan at this time is to perform CT scans of the thoracic and lumbar spine for further characterization.  Awaiting results at this time.  Remainder of patient's work-up is reassuring in regards to the syncope..   ----------------------------------------- 5:54 PM on 04/21/2021 -----------------------------------------  CT scans of the thoracic and lumbar spine returned without evidence of acute compression fracture.  There is some bulging disc in the lumbar spine which do not appear to be acute.  There is no evidence of acute impingement on the spine itself.  At this time patient's exam remains reassuring.  Patient reports that the back pain actually began prior to the syncope and collapse.  No significant changes after her fall.  No indication for further imaging to include MRI at this time.  She has had no other symptoms develop.  Patient states that she feels well and would like to go home.  As labs and imaging are reassuring at this time as well as a reassuring EKG I feel that patient is stable for discharge.  I will prescribe short burst of steroid and a muscle relaxer for at home control of low back pain.  Concerning signs and symptoms are  discussed with the patient and her husband to return to the emergency department for.  They verbalized understanding of same.   ED diagnosis:  Syncope and collapse Lower back pain with right-sided sciatica    Darletta Moll, PA-C 04/21/21 Jennye Moccasin, MD 04/23/21 [phone]

## 2021-04-21 NOTE — ED Notes (Signed)
Patient transported to CT 

## 2021-04-21 NOTE — ED Provider Notes (Signed)
[facility] Emergency Department Provider Note  ____________________________________________   Event Date/Time   First MD Initiated Contact with Patient 04/21/21 1316     (approximate)  I have reviewed the triage vital signs and the nursing notes.   HISTORY  Chief Complaint Back Pain  HPI Kendra Ritter is a 75 y.o. female who presents to the emergency department for evaluation of back pain with associated right leg pain for the last 3 weeks.  She denies any known trauma leading up to the back pain.  After describing this, she then reports to me that she syncopized a few hours ago while at home.  She does not recall any preceding chest pain, shortness of breath, dizziness or other symptoms.  She states that she fell on the floor, her husband heard the noise and came into the room to find her on the floor.  States the syncope was there for a few seconds.  She denies any significant change in her back or leg pain since the new fall with syncope.  She denies any loss of bowel or bladder control, denies any recent fevers or other illness symptoms.         Past Medical History:  Diagnosis Date   Anemia    BPPV (benign paroxysmal positional vertigo)    Cervical ca [facility])    CHF (congestive heart failure) (HCC)    Chronic kidney disease    Colon cancer ([place]) 1998   GERD (gastroesophageal reflux disease)    H/O aortic valve replacement    Heart murmur    Hx of cervical cancer    Hypertension    Sleep apnea    Stroke [facility])    multiple strokes, 2010, 2011, 2013     Patient Active Problem List   Diagnosis Date Noted   Duodenal adenoma    Senile nuclear cataract, right 10/23/2018   Corneal scar, 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   Transient cerebral ischemia    TIA (transient ischemic attack) 08/21/2016   Gastroesophageal reflux disease 09/22/2015   History of CVA (cerebrovascular accident) 09/22/2015   Sleep apnea 09/22/2015   Chest  pain 03/06/2014   S/P MVR (mitral valve repair) 03/06/2014   Murmur 03/06/2014   SOB (shortness of breath) 03/06/2014   HTN (hypertension) 03/06/2014    Past Surgical History:  Procedure Laterality Date   AORTIC VALVE REPLACEMENT  2008   [facility]   APPENDECTOMY     CARDIAC CATHETERIZATION  2008   [facility]   COLON SURGERY     colostoma bag   COLONOSCOPY WITH ESOPHAGOGASTRODUODENOSCOPY (EGD)     COLOSTOMY  1998   After colon cancer    ESOPHAGOGASTRODUODENOSCOPY (EGD) WITH PROPOFOL N/A 06/29/2019   Procedure: ESOPHAGOGASTRODUODENOSCOPY (EGD) WITH PROPOFOL;  Surgeon: Lin Landsman, MD;  Location: [facility] ENDOSCOPY;  Service: Gastroenterology;  Laterality: N/A;   ESOPHAGOGASTRODUODENOSCOPY (EGD) WITH PROPOFOL N/A 08/03/2019   Procedure: ESOPHAGOGASTRODUODENOSCOPY (EGD) WITH PROPOFOL;  Surgeon: Lin Landsman, MD;  Location: [facility] ENDOSCOPY;  Service: Gastroenterology;  Laterality: N/A;   partial hysterectomy     cervical cancer    Prior to Admission medications   Medication Sig Start Date End Date Taking? Authorizing Provider  amLODipine (NORVASC) 10 MG tablet Take 1 tablet (10 mg total) by mouth daily. 04/10/14   Minna Merritts, MD  aspirin EC 81 MG EC tablet Take 1 tablet (81 mg total) by mouth daily. 08/24/16   Nicholes Mango, MD  carvedilol (COREG) 25 MG tablet  Take 25 mg by mouth 2 (two) times daily with a meal.    [provider]  cloNIDine (CATAPRES) 0.1 MG tablet Take 0.1 mg by mouth daily.  07/19/17   [provider]  Cyanocobalamin (B-12) 2500 MCG SUBL Place 2,500 mcg under the tongue daily.    [provider]  diphenoxylate-atropine (LOMOTIL) 2.5-0.025 MG tablet TAKE 2 TABLETS BY MOUTH 4 TIMES A DAY AS NEEDED FOR DIARRHEA OR LOOSE STOOLS 10/29/19   Vanga, Tally Due, MD  ferrous sulfate 325 (65 FE) MG tablet TAKE 1 TABLET (325 MG TOTAL) BY MOUTH 2 (TWO) TIMES DAILY WITH A MEAL. 08/14/18   Lin Landsman, MD  gabapentin (NEURONTIN) 300 MG capsule  Take 300 mg by mouth 3 (three) times daily.  01/29/14   [provider]  hydrALAZINE (APRESOLINE) 25 MG tablet Take 25 mg by mouth 3 (three) times daily. 07/31/18   [provider]  latanoprost (XALATAN) 0.005 % ophthalmic solution Place 1 drop into both eyes at bedtime. 04/22/16   [provider]  losartan (COZAAR) 100 MG tablet  11/18/18   [provider]  Omega-3 Fatty Acids (FISH OIL) 1000 MG CAPS Take 1,000 mg by mouth daily.    [provider]  omeprazole (PRILOSEC) 40 MG capsule  04/25/19   [provider]  ondansetron (ZOFRAN ODT) 4 MG disintegrating tablet Take 1 tablet (4 mg total) by mouth every 8 (eight) hours as needed. 11/14/18   Rudene Re, MD  Potassium 99 MG TABS Take 99 mg by mouth daily.    [provider]  potassium chloride (K-DUR) 10 MEQ tablet  04/25/19   [provider]  prednisoLONE acetate (PRED FORTE) 1 % ophthalmic suspension APPLY TO THE OPERATIVE EYE, 1 DROP FOUR TIMES A DAY 01/03/19   [provider]  spironolactone (ALDACTONE) 25 MG tablet TAKE ONE TABLET BY MOUTH DAILY- TO REPLACE HCTZ 08/02/17   [provider]  triamcinolone ointment (KENALOG) 0.1 %  04/24/19   [provider]    Allergies Lisinopril  Family History  Problem Relation Age of Onset   Heart disease Mother    Heart attack Mother    Hypertension Mother    Breast cancer Maternal Aunt     Social History Social History   Tobacco Use   Smoking status: Never   Smokeless tobacco: Never  Vaping Use   Vaping Use: Never used  Substance Use Topics   Alcohol use: No   Drug use: No    Review of Systems Constitutional: + Syncope, no fever/chills Eyes: No visual changes. ENT: No sore throat. Cardiovascular: Denies chest pain. Respiratory: Denies shortness of breath. Gastrointestinal: No abdominal pain.  No nausea, no vomiting.  No diarrhea.  No constipation. Genitourinary: Negative for  dysuria. Musculoskeletal: + Back pain right leg pain Skin: Negative for rash. Neurological: Negative for headaches, focal weakness or numbness.   ____________________________________________   PHYSICAL EXAM:  VITAL SIGNS: ED Triage Vitals [04/21/21 1252]  Enc Vitals Group     BP (!) 156/78     Pulse Rate 82     Resp 17     Temp 98.4 F (36.9 C)     Temp Source Oral     SpO2 100 %     Weight 152 lb (68.9 kg)     Height 5\' 2"  (1.575 m)     Head Circumference      Peak Flow      Pain Score 8  Pain Loc      Pain Edu?      Excl. in [place]?    Constitutional: Alert and oriented. Well appearing and in no acute distress. Eyes: Conjunctivae are normal. PERRL. EOMI. Head: Atraumatic. Nose: No congestion/rhinnorhea. Mouth/Throat: Mucous membranes are moist.  Neck: No stridor.   Cardiovascular: Normal rate, regular rhythm. Grossly normal heart sounds.  Good peripheral circulation. Respiratory: Normal respiratory effort.  No retractions. Lungs CTAB. Gastrointestinal: Soft and nontender. No distention. No abdominal bruits. No CVA tenderness. Musculoskeletal: There is tenderness to palpation of the midline and paraspinals of the lumbar spine.  Negative logroll bilaterally of the hips.  5/5 strength in ankle plantarflexion and dorsiflexion.  Positive straight leg raise on the right.  Dorsal pedal pulses 2+ bilaterally.  No pitting edema present. Neurologic:  Normal speech and language.  Cranial nerves II through XII grossly intact.  No gross focal neurologic deficits are appreciated. No gait instability. Skin:  Skin is warm, dry and intact. No rash noted. Psychiatric: Mood and affect are normal. Speech and behavior are normal.  ____________________________________________   LABS (all labs ordered are listed, but only abnormal results are displayed)  Labs Reviewed  COMPREHENSIVE METABOLIC PANEL - Abnormal; Notable for the following components:      Result Value   Potassium 3.2 (*)     CO2 20 (*)    Glucose, Bld 112 (*)    Creatinine, Ser 1.15 (*)    GFR, Estimated 50 (*)    All other components within normal limits  CBC WITH DIFFERENTIAL/PLATELET - Abnormal; Notable for the following components:   WBC 11.0 (*)    Hemoglobin 11.5 (*)    Neutro Abs 9.3 (*)    All other components within normal limits  BRAIN NATRIURETIC PEPTIDE  MAGNESIUM  URINALYSIS, COMPLETE (UACMP) WITH MICROSCOPIC  TROPONIN I (HIGH SENSITIVITY)  TROPONIN I (HIGH SENSITIVITY)   ____________________________________________  EKG  Normal sinus rhythm with a rate of 66 bpm.  No significant ST elevation or depression.  No prolonged QT ____________________________________________  RADIOLOGY Lenoria Farrier, personally viewed and evaluated these images (plain radiographs) as part of my medical decision making, as well as reviewing the written report by the radiologist.  ED provider interpretation: Chest x-ray demonstrates cardiomegaly without any acute focal findings.  X-ray of the lumbar spine demonstrates some T11 vertebral body loss but no other acute findings present.  See radiology report for CT findings.  Official radiology report(s): DG Chest 2 View  Result Date: 04/21/2021 CLINICAL DATA:  Syncope. EXAM: CHEST - 2 VIEW COMPARISON:  08/21/2016 FINDINGS: Midline trachea. Moderate cardiomegaly. Atherosclerosis in the transverse aorta. Status post mitral valve repair. No congestive failure. No pleural effusion or pneumothorax. No lobar consolidation. Upper abdominal surgical clips and right chest wall wires. IMPRESSION: Cardiomegaly without congestive failure. Aortic Atherosclerosis (ICD10-I70.0). Electronically Signed   By: Abigail Miyamoto M.D.   On: 04/21/2021 15:32   DG Lumbar Spine 2-3 Views  Result Date: 04/21/2021 CLINICAL DATA:  Syncope. EXAM: LUMBAR SPINE - 2-3 VIEW COMPARISON:  CT abdomen/pelvis 10/31/2016. FINDINGS: Mild T11 vertebral body height loss, which appears new from 2018 CT  abdomen/pelvis. Otherwise, no definite other levels of vertebral body height loss when accounting for obliquity on the lateral radiograph. No substantial sagittal subluxation. Levocurvature of the lumbar spine. Multilevel mild disc height loss with lower lumbar facet arthropathy. Right abdominal clips. IMPRESSION: 1. Mild T11 vertebral body height loss, which appears new from 2018 CT abdomen/pelvis. This could potentially be degenerative  in etiology; however, fracture is not excluded. A CT or MRI could further evaluate if clinically indicated. 2. Levocurvature of the lumbar spine with right greater than left facet hypertrophy/arthropathy in the lower lumbar spine. Mild multilevel degenerative disc height loss. Electronically Signed   By: Margaretha Sheffield MD   On: 04/21/2021 15:37   CT Head Wo Contrast  Result Date: 04/21/2021 CLINICAL DATA:  Facial trauma.  Fall. EXAM: CT HEAD WITHOUT CONTRAST TECHNIQUE: Contiguous axial images were obtained from the base of the skull through the vertex without intravenous contrast. COMPARISON:  08/21/2016 FINDINGS: Brain: No acute intracranial abnormality. Specifically, no hemorrhage, hydrocephalus, mass lesion, acute infarction, or significant intracranial injury. Vascular: No hyperdense vessel or unexpected calcification. Skull: No acute calvarial abnormality. Sinuses/Orbits: No acute findings Other: None IMPRESSION: No acute intracranial abnormality. Electronically Signed   By: Rolm Baptise M.D.   On: 04/21/2021 14:39   CT Cervical Spine Wo Contrast  Result Date: 04/21/2021 CLINICAL DATA:  Fall EXAM: CT CERVICAL SPINE WITHOUT CONTRAST TECHNIQUE: Multidetector CT imaging of the cervical spine was performed without intravenous contrast. Multiplanar CT image reconstructions were also generated. COMPARISON:  None. FINDINGS: Alignment: No subluxation. Skull base and vertebrae: No acute fracture. No primary bone lesion or focal pathologic process. Soft tissues and spinal  canal: No prevertebral fluid or swelling. No visible canal hematoma. Disc levels: Moderate diffuse degenerative facet disease bilaterally. Degenerative disc disease, most pronounced at C6-7. Upper chest: No acute findings Other: None IMPRESSION: Degenerative disc and facet disease. No acute bony abnormality. Electronically Signed   By: Rolm Baptise M.D.   On: 04/21/2021 14:41   DG Hip Unilat W or Wo Pelvis 2-3 Views Right  Result Date: 04/21/2021 CLINICAL DATA:  Increasing lower back pain radiating to the right leg EXAM: DG HIP (WITH OR WITHOUT PELVIS) 2-3V RIGHT COMPARISON:  CT October 31, 2016. FINDINGS: There is no evidence of hip fracture or dislocation. Mild right hip DJD. Pelvic phleboliths. Surgical clips project over the right upper quadrant and midline pelvis. Vascular calcifications. IMPRESSION: Mild right hip DJD. No acute osseous abnormality. Electronically Signed   By: Dahlia Bailiff MD   On: 04/21/2021 15:34       ____________________________________________   INITIAL IMPRESSION / ASSESSMENT AND PLAN / ED COURSE  As part of my medical decision making, I reviewed the following data within the [place] notes reviewed and incorporated, Labs reviewed, Radiograph reviewed, Notes from prior ED visits, and [place] Controlled Substance Database        Patient is a 75 yo female who presents to the ER for evaluation after syncopal episode of unknown cause as well as persistent back and leg pain over the last 3 weeks without trauma. See HPI for further details.  In triage, patient is afebrile, mildly hypertensive, however with heart rate of 82, 100% oxygenation.  Given new syncopal episode with unknown cause, obtained laboratory evaluation with CBC, CMP, BNP, troponin, magnesium, urinalysis.  BNP, magnesium, multiple troponins within normal limits.  CMP with very mild hypokalemia of 3.2 and mild elevation of creatinine to 1.15, otherwise grossly unremarkable.  White  count of 11.0 with left shift.  Urinalysis still pending at this time.  CT of the head and cervical spine was obtained and is negative for any acute pathology.  Chest x-ray negative for any focal pneumonia or other acute findings.  X-rays of the lumbar spine and hip demonstrate possible T11 compression fracture of unknown clinical significance/unknown age but new since  prior CT in 2018.  In regards to the patient's syncope, no clear cause at this time.  Cardiac labs and other labs are reassuring with urinalysis still pending.  Low suspicion for PE given no hypoxia, no tachycardia, no preceding symptoms of chest pain or shortness of breath.  Patient does have history of stroke, however has no focal neurodeficits on exam and does not have any current symptoms consistent with stroke.  We will have patient follow-up with primary care regarding syncope.  In regards to possible compression fracture, will obtain CT as recommended by radiology to further evaluate age and severity of this injury.  At this time, patient is being handed off to oncoming provider Betha Loa, PA-C who will follow the CT results and make appropriate disposition at that time.  Patient was updated and is amenable with plan.  She is stable at this time.      ____________________________________________   FINAL CLINICAL IMPRESSION(S) / ED DIAGNOSES  Final diagnoses:  None     ED Discharge Orders     None        Note:  This document was prepared using Dragon voice recognition software and may include unintentional dictation errors.    Marlana Salvage, PA 04/22/21 1388    Lucrezia Starch, MD 04/23/21 [phone]

## 2021-11-19 DIAGNOSIS — H524 Presbyopia: Secondary | ICD-10-CM | POA: Diagnosis not present

## 2021-11-19 DIAGNOSIS — Z0100 Encounter for examination of eyes and vision without abnormal findings: Secondary | ICD-10-CM | POA: Diagnosis not present

## 2021-12-03 DIAGNOSIS — G8929 Other chronic pain: Secondary | ICD-10-CM | POA: Diagnosis not present

## 2021-12-03 DIAGNOSIS — E876 Hypokalemia: Secondary | ICD-10-CM | POA: Diagnosis not present

## 2021-12-03 DIAGNOSIS — I10 Essential (primary) hypertension: Secondary | ICD-10-CM | POA: Diagnosis not present

## 2021-12-03 DIAGNOSIS — Z933 Colostomy status: Secondary | ICD-10-CM | POA: Diagnosis not present

## 2021-12-03 DIAGNOSIS — R7309 Other abnormal glucose: Secondary | ICD-10-CM | POA: Diagnosis not present

## 2021-12-03 DIAGNOSIS — M25561 Pain in right knee: Secondary | ICD-10-CM | POA: Diagnosis not present

## 2021-12-03 DIAGNOSIS — I2510 Atherosclerotic heart disease of native coronary artery without angina pectoris: Secondary | ICD-10-CM | POA: Diagnosis not present

## 2021-12-03 DIAGNOSIS — K219 Gastro-esophageal reflux disease without esophagitis: Secondary | ICD-10-CM | POA: Diagnosis not present

## 2021-12-03 DIAGNOSIS — M25562 Pain in left knee: Secondary | ICD-10-CM | POA: Diagnosis not present

## 2021-12-31 DIAGNOSIS — I10 Essential (primary) hypertension: Secondary | ICD-10-CM | POA: Diagnosis not present

## 2021-12-31 DIAGNOSIS — R079 Chest pain, unspecified: Secondary | ICD-10-CM | POA: Diagnosis not present

## 2021-12-31 DIAGNOSIS — R0609 Other forms of dyspnea: Secondary | ICD-10-CM | POA: Diagnosis not present

## 2021-12-31 DIAGNOSIS — Z955 Presence of coronary angioplasty implant and graft: Secondary | ICD-10-CM | POA: Diagnosis not present

## 2021-12-31 DIAGNOSIS — I340 Nonrheumatic mitral (valve) insufficiency: Secondary | ICD-10-CM | POA: Diagnosis not present

## 2021-12-31 DIAGNOSIS — I2510 Atherosclerotic heart disease of native coronary artery without angina pectoris: Secondary | ICD-10-CM | POA: Diagnosis not present

## 2022-01-07 DIAGNOSIS — M79605 Pain in left leg: Secondary | ICD-10-CM | POA: Diagnosis not present

## 2022-01-07 DIAGNOSIS — I10 Essential (primary) hypertension: Secondary | ICD-10-CM | POA: Diagnosis not present

## 2022-01-28 DIAGNOSIS — Z955 Presence of coronary angioplasty implant and graft: Secondary | ICD-10-CM | POA: Diagnosis not present

## 2022-01-28 DIAGNOSIS — R0609 Other forms of dyspnea: Secondary | ICD-10-CM | POA: Diagnosis not present

## 2022-01-28 DIAGNOSIS — I340 Nonrheumatic mitral (valve) insufficiency: Secondary | ICD-10-CM | POA: Diagnosis not present

## 2022-01-28 DIAGNOSIS — I2510 Atherosclerotic heart disease of native coronary artery without angina pectoris: Secondary | ICD-10-CM | POA: Diagnosis not present

## 2022-02-06 DIAGNOSIS — Z934 Other artificial openings of gastrointestinal tract status: Secondary | ICD-10-CM | POA: Diagnosis not present

## 2022-02-06 DIAGNOSIS — Z933 Colostomy status: Secondary | ICD-10-CM | POA: Diagnosis not present

## 2022-02-18 DIAGNOSIS — I2510 Atherosclerotic heart disease of native coronary artery without angina pectoris: Secondary | ICD-10-CM | POA: Diagnosis not present

## 2022-02-18 DIAGNOSIS — I5020 Unspecified systolic (congestive) heart failure: Secondary | ICD-10-CM | POA: Diagnosis not present

## 2022-02-18 DIAGNOSIS — Z01818 Encounter for other preprocedural examination: Secondary | ICD-10-CM | POA: Diagnosis not present

## 2022-02-18 DIAGNOSIS — R9439 Abnormal result of other cardiovascular function study: Secondary | ICD-10-CM | POA: Diagnosis not present

## 2022-02-18 DIAGNOSIS — Z955 Presence of coronary angioplasty implant and graft: Secondary | ICD-10-CM | POA: Diagnosis not present

## 2022-03-03 DIAGNOSIS — H26492 Other secondary cataract, left eye: Secondary | ICD-10-CM | POA: Diagnosis not present

## 2022-03-03 DIAGNOSIS — Z961 Presence of intraocular lens: Secondary | ICD-10-CM | POA: Diagnosis not present

## 2022-03-03 DIAGNOSIS — H40003 Preglaucoma, unspecified, bilateral: Secondary | ICD-10-CM | POA: Diagnosis not present

## 2022-03-04 DIAGNOSIS — I429 Cardiomyopathy, unspecified: Secondary | ICD-10-CM

## 2022-03-04 DIAGNOSIS — R079 Chest pain, unspecified: Secondary | ICD-10-CM

## 2022-03-04 DIAGNOSIS — R9439 Abnormal result of other cardiovascular function study: Secondary | ICD-10-CM

## 2022-03-10 ENCOUNTER — Other Ambulatory Visit: Payer: Self-pay

## 2022-03-10 ENCOUNTER — Encounter: Payer: Self-pay | Admitting: Cardiology

## 2022-03-10 ENCOUNTER — Encounter
Admission: RE | Disposition: A | Discharge status: Home / Self Care | Payer: Self-pay | Source: Home / Self Care | Attending: Cardiology

## 2022-03-10 ENCOUNTER — Ambulatory Visit
Admission: RE | Admit: 2022-03-10 | Discharge: 2022-03-10 | Disposition: A | Discharge status: Home / Self Care | Payer: No Typology Code available for payment source | Attending: Cardiology | Admitting: Cardiology

## 2022-03-10 DIAGNOSIS — I2510 Atherosclerotic heart disease of native coronary artery without angina pectoris: Secondary | ICD-10-CM | POA: Insufficient documentation

## 2022-03-10 DIAGNOSIS — Z79899 Other long term (current) drug therapy: Secondary | ICD-10-CM | POA: Insufficient documentation

## 2022-03-10 DIAGNOSIS — R079 Chest pain, unspecified: Secondary | ICD-10-CM

## 2022-03-10 DIAGNOSIS — I429 Cardiomyopathy, unspecified: Secondary | ICD-10-CM

## 2022-03-10 DIAGNOSIS — I509 Heart failure, unspecified: Secondary | ICD-10-CM | POA: Insufficient documentation

## 2022-03-10 DIAGNOSIS — R9439 Abnormal result of other cardiovascular function study: Secondary | ICD-10-CM

## 2022-03-10 DIAGNOSIS — Z952 Presence of prosthetic heart valve: Secondary | ICD-10-CM | POA: Insufficient documentation

## 2022-03-10 DIAGNOSIS — Z7982 Long term (current) use of aspirin: Secondary | ICD-10-CM | POA: Diagnosis not present

## 2022-03-10 DIAGNOSIS — N189 Chronic kidney disease, unspecified: Secondary | ICD-10-CM | POA: Insufficient documentation

## 2022-03-10 DIAGNOSIS — R0609 Other forms of dyspnea: Secondary | ICD-10-CM | POA: Insufficient documentation

## 2022-03-10 DIAGNOSIS — I130 Hypertensive heart and chronic kidney disease with heart failure and stage 1 through stage 4 chronic kidney disease, or unspecified chronic kidney disease: Secondary | ICD-10-CM | POA: Insufficient documentation

## 2022-03-10 DIAGNOSIS — R0602 Shortness of breath: Secondary | ICD-10-CM | POA: Diagnosis not present

## 2022-03-10 DIAGNOSIS — Z8673 Personal history of transient ischemic attack (TIA), and cerebral infarction without residual deficits: Secondary | ICD-10-CM | POA: Insufficient documentation

## 2022-03-10 HISTORY — PX: LEFT HEART CATH AND CORONARY ANGIOGRAPHY: CATH118249

## 2022-03-10 SURGERY — LEFT HEART CATH AND CORONARY ANGIOGRAPHY
Anesthesia: Moderate Sedation

## 2022-03-10 MED ORDER — MIDAZOLAM HCL 2 MG/2ML IJ SOLN
INTRAMUSCULAR | Status: DC | PRN
  Administered 2022-03-10: 1 mg via INTRAVENOUS

## 2022-03-10 MED ORDER — ASPIRIN 81 MG PO CHEW
CHEWABLE_TABLET | ORAL | Status: AC
  Filled 2022-03-10: qty 4

## 2022-03-10 MED ORDER — HEPARIN SODIUM (PORCINE) 1000 UNIT/ML IJ SOLN
INTRAMUSCULAR | Status: DC | PRN
  Administered 2022-03-10: 3500 [IU] via INTRAVENOUS

## 2022-03-10 MED ORDER — HEPARIN SODIUM (PORCINE) 1000 UNIT/ML IJ SOLN
INTRAMUSCULAR | Status: AC
  Filled 2022-03-10: qty 10

## 2022-03-10 MED ORDER — IOHEXOL 300 MG/ML  SOLN
INTRAMUSCULAR | Status: DC | PRN
  Administered 2022-03-10: 75 mL

## 2022-03-10 MED ORDER — SODIUM CHLORIDE 0.9% FLUSH: 3.0000 mL | Freq: Two times a day (BID) | INTRAVENOUS | Status: DC

## 2022-03-10 MED ORDER — SODIUM CHLORIDE 0.9% FLUSH: 3.0000 mL | INTRAVENOUS | Status: DC | PRN

## 2022-03-10 MED ORDER — SODIUM CHLORIDE 0.9 % IV SOLN: 250.0000 mL | INTRAVENOUS | Status: DC | PRN

## 2022-03-10 MED ORDER — SODIUM CHLORIDE 0.9 % IV SOLN: INTRAVENOUS | Status: AC

## 2022-03-10 MED ORDER — ACETAMINOPHEN 325 MG PO TABS: 650.0000 mg | ORAL_TABLET | ORAL | Status: DC | PRN

## 2022-03-10 MED ORDER — SODIUM CHLORIDE 0.9 % IV SOLN: INTRAVENOUS | Status: DC

## 2022-03-10 MED ORDER — SODIUM CHLORIDE 0.9 % IV SOLN
INTRAVENOUS | Status: DC
Start: 2022-03-10 — End: 2022-03-10

## 2022-03-10 MED ORDER — ASPIRIN 81 MG PO CHEW
324.0000 mg | CHEWABLE_TABLET | ORAL | Status: AC
  Administered 2022-03-10: 324 mg via ORAL

## 2022-03-10 MED ORDER — ASPIRIN 81 MG PO CHEW: 324.0000 mg | CHEWABLE_TABLET | ORAL | Status: DC

## 2022-03-10 MED ORDER — LIDOCAINE HCL 1 % IJ SOLN
INTRAMUSCULAR | Status: AC
  Filled 2022-03-10: qty 20

## 2022-03-10 MED ORDER — FENTANYL CITRATE (PF) 100 MCG/2ML IJ SOLN
INTRAMUSCULAR | Status: AC
  Filled 2022-03-10: qty 2

## 2022-03-10 MED ORDER — VERAPAMIL HCL 2.5 MG/ML IV SOLN
INTRAVENOUS | Status: DC | PRN
  Administered 2022-03-10: 2.5 mg via INTRA_ARTERIAL

## 2022-03-10 MED ORDER — FENTANYL CITRATE (PF) 100 MCG/2ML IJ SOLN
INTRAMUSCULAR | Status: DC | PRN
  Administered 2022-03-10: 25 ug via INTRAVENOUS

## 2022-03-10 MED ORDER — MIDAZOLAM HCL 2 MG/2ML IJ SOLN
INTRAMUSCULAR | Status: AC
  Filled 2022-03-10: qty 2

## 2022-03-10 MED ORDER — LIDOCAINE HCL (PF) 1 % IJ SOLN
INTRAMUSCULAR | Status: DC | PRN
  Administered 2022-03-10: 2 mL

## 2022-03-10 MED ORDER — SODIUM CHLORIDE 0.9% FLUSH
3.0000 mL | Freq: Two times a day (BID) | INTRAVENOUS | Status: DC
Start: 2022-03-10 — End: 2022-03-10

## 2022-03-10 MED ORDER — HEPARIN (PORCINE) IN NACL 1000-0.9 UT/500ML-% IV SOLN
INTRAVENOUS | Status: DC | PRN
  Administered 2022-03-10: 1000 mL

## 2022-03-10 MED ORDER — VERAPAMIL HCL 2.5 MG/ML IV SOLN
INTRAVENOUS | Status: AC
  Filled 2022-03-10: qty 2

## 2022-03-10 MED ORDER — ONDANSETRON HCL 4 MG/2ML IJ SOLN: 4.0000 mg | Freq: Four times a day (QID) | INTRAMUSCULAR | Status: DC | PRN

## 2022-03-10 MED ORDER — HEPARIN (PORCINE) IN NACL 1000-0.9 UT/500ML-% IV SOLN
INTRAVENOUS | Status: AC
  Filled 2022-03-10: qty 1000

## 2022-03-10 SURGICAL SUPPLY — 11 items
CATH 5FR JL3.5 JR4 ANG PIG MP (CATHETERS) ×1 IMPLANT
CATH INFINITI 5 FR 3DRC (CATHETERS) ×1 IMPLANT
DEVICE RAD TR BAND REGULAR (VASCULAR PRODUCTS) ×1 IMPLANT
DRAPE BRACHIAL (DRAPES) ×1 IMPLANT
GLIDESHEATH SLEND SS 6F .021 (SHEATH) ×1 IMPLANT
GUIDEWIRE INQWIRE 1.5J.035X260 (WIRE) IMPLANT
INQWIRE 1.5J .035X260CM (WIRE) ×2
PACK CARDIAC CATH (CUSTOM PROCEDURE TRAY) ×2 IMPLANT
PROTECTION STATION PRESSURIZED (MISCELLANEOUS) ×2
SET ATX SIMPLICITY (MISCELLANEOUS) ×1 IMPLANT
STATION PROTECTION PRESSURIZED (MISCELLANEOUS) IMPLANT

## 2022-03-10 NOTE — H&P (Signed)
[facility] Cardiology History and Physical  Patient ID: Kendra Ritter MRN: [id number] DOB/AGE: 75-Nov-1947 75 y.o. Admit date: 03/10/2022  Primary Care Physician: Letta Median, MD Primary Cardiologist Andrez Grime, MD   HPI:   Kendra Ritter is a 76 year old female with history of CAD s/p PCI 2011 (unclear details), MR s/p mitral valve repair 2008 at [facility], CVA (multiple), hypertension, hyperlipidemia, cervical cancer, colon cancer s/p resection with colonoscopy who describes shortness of breath with exertion and mild chest discomfort.  Echo showed a mildly decreased ejection fraction with inferior hypokinesis.  Nuclear medicine stress test showed apical/apical anterior defect that was reversible.  Center progressive symptoms we are proceeding with heart catheterization for further evaluation.   Past Medical History:  Diagnosis Date   Anemia    BPPV (benign paroxysmal positional vertigo)    Cervical ca (HCC)    CHF (congestive heart failure) (HCC)    Chronic kidney disease    Colon cancer ([place]) 1998   GERD (gastroesophageal reflux disease)    H/O aortic valve replacement    Heart murmur    Hx of cervical cancer    Hypertension    Sleep apnea    Stroke [facility])    multiple strokes, 2010, 2011, 2013     Past Surgical History:  Procedure Laterality Date   AORTIC VALVE REPLACEMENT  2008   [facility]   APPENDECTOMY     CARDIAC CATHETERIZATION  2008   [facility]   CHOLECYSTECTOMY     COLON SURGERY     colostoma bag   COLONOSCOPY WITH ESOPHAGOGASTRODUODENOSCOPY (EGD)     COLOSTOMY  1998   After colon cancer    ESOPHAGOGASTRODUODENOSCOPY (EGD) WITH PROPOFOL N/A 06/29/2019   Procedure: ESOPHAGOGASTRODUODENOSCOPY (EGD) WITH PROPOFOL;  Surgeon: Lin Landsman, MD;  Location: [facility] ENDOSCOPY;  Service: Gastroenterology;  Laterality: N/A;   ESOPHAGOGASTRODUODENOSCOPY (EGD) WITH PROPOFOL N/A 08/03/2019   Procedure: ESOPHAGOGASTRODUODENOSCOPY (EGD) WITH PROPOFOL;  Surgeon: Lin Landsman,  MD;  Location: [facility] ENDOSCOPY;  Service: Gastroenterology;  Laterality: N/A;   partial hysterectomy     cervical cancer    Medications Prior to Admission  Medication Sig Dispense Refill Last Dose   amLODipine (NORVASC) 10 MG tablet Take 1 tablet (10 mg total) by mouth daily. 30 tablet 6 03/09/2022 at 1000   aspirin EC 81 MG EC tablet Take 1 tablet (81 mg total) by mouth daily.   03/09/2022   carvedilol (COREG) 25 MG tablet Take 25 mg by mouth 2 (two) times daily with a meal.   03/09/2022 at 2000   cloNIDine (CATAPRES) 0.1 MG tablet Take 0.1 mg by mouth daily.    03/10/2022 at 0200   Cyanocobalamin (B-12) 2500 MCG SUBL Place 2,500 mcg under the tongue daily.   03/09/2022   diphenoxylate-atropine (LOMOTIL) 2.5-0.025 MG tablet TAKE 2 TABLETS BY MOUTH 4 TIMES A DAY AS NEEDED FOR DIARRHEA OR LOOSE STOOLS 90 tablet 0 03/09/2022   ferrous sulfate 325 (65 FE) MG tablet TAKE 1 TABLET (325 MG TOTAL) BY MOUTH 2 (TWO) TIMES DAILY WITH A MEAL. 180 tablet 1 03/09/2022   gabapentin (NEURONTIN) 300 MG capsule Take 300 mg by mouth 3 (three) times daily.    03/09/2022   hydrALAZINE (APRESOLINE) 25 MG tablet Take 25 mg by mouth 3 (three) times daily.  1 03/09/2022 at 0   latanoprost (XALATAN) 0.005 % ophthalmic solution Place 1 drop into both eyes at bedtime.   03/09/2022 at 2000   methocarbamol (ROBAXIN) 500 MG tablet Take 1 tablet (500  mg total) by mouth 4 (four) times daily. 16 tablet 0 03/09/2022 at 2000   Omega-3 Fatty Acids (FISH OIL) 1000 MG CAPS Take 1,000 mg by mouth daily.   03/09/2022   omeprazole (PRILOSEC) 40 MG capsule    03/09/2022 at 1000   ondansetron (ZOFRAN ODT) 4 MG disintegrating tablet Take 1 tablet (4 mg total) by mouth every 8 (eight) hours as needed. 20 tablet 0 03/09/2022   Potassium 99 MG TABS Take 99 mg by mouth daily.   03/09/2022   prednisoLONE acetate (PRED FORTE) 1 % ophthalmic suspension APPLY TO THE OPERATIVE EYE, 1 DROP FOUR TIMES A DAY   03/09/2022 at 2000   spironolactone (ALDACTONE) 25 MG  tablet TAKE ONE TABLET BY MOUTH DAILY- TO REPLACE HCTZ  1 03/09/2022   triamcinolone ointment (KENALOG) 0.1 %    03/09/2022   losartan (COZAAR) 100 MG tablet  (Patient not taking: Reported on 03/10/2022)   Completed Course   potassium chloride (K-DUR) 10 MEQ tablet       predniSONE (DELTASONE) 10 MG tablet Take 1 tablet (10 mg total) by mouth as directed. (Patient not taking: Reported on 03/10/2022) 42 tablet 0 Completed Course   Social History   Socioeconomic History   Marital status: Married    Spouse name: Iona Beard   Number of children: 4   Years of education: Not on file   Highest education level: Not on file  Occupational History   Not on file  Tobacco Use   Smoking status: Never   Smokeless tobacco: Never  Vaping Use   Vaping Use: Never used  Substance and Sexual Activity   Alcohol use: No   Drug use: No   Sexual activity: Not on file  Other Topics Concern   Not on file  Social History Narrative   Lives at home with spouse Iona Beard. She had 2 children, spouse had 2 children : all grown & living out of the house    Social Determinants of Health   Financial Resource Strain: Not on file  Food Insecurity: Not on file  Transportation Needs: Not on file  Physical Activity: Not on file  Stress: Not on file  Social Connections: Not on file  Intimate Partner Violence: Not on file    Family History  Problem Relation Age of Onset   Heart disease Mother    Heart attack Mother    Hypertension Mother    Breast cancer Maternal Aunt       Review of systems complete and found to be negative unless listed above      Physical Exam:  General: Well developed, well nourished, in no acute distress HEENT:  Normocephalic and atramatic Neck:  No JVD.  Lungs: Clear bilaterally to auscultation and percussion. Heart: HRRR . Normal S1 and S2 without gallops or murmurs.  Abdomen: Bowel sounds are positive, abdomen soft and non-tender  Msk:  Back normal, normal gait. Normal strength and  tone for age. Extremities: No clubbing, cyanosis or edema.   Neuro: Alert and oriented X 3. Psych:  Good affect, responds appropriately   Labs:   Lab Results  Component Value Date   WBC 11.0 (H) 04/21/2021   HGB 11.5 (L) 04/21/2021   HCT 36.3 04/21/2021   MCV 85.8 04/21/2021   PLT 354 04/21/2021   No results for input(s): NA, K, CL, CO2, BUN, CREATININE, CALCIUM, PROT, BILITOT, ALKPHOS, ALT, AST, GLUCOSE in the last 168 hours.  Invalid input(s): LABALBU Lab Results  Component Value Date  TROPONINI <0.03 08/23/2016    Lab Results  Component Value Date   CHOL 119 08/22/2016   Lab Results  Component Value Date   HDL 53 08/22/2016   Lab Results  Component Value Date   LDLCALC 39 08/22/2016   Lab Results  Component Value Date   TRIG 136 08/22/2016   Lab Results  Component Value Date   CHOLHDL 2.2 08/22/2016   No results found for: LDLDIRECT    Radiology: No results found.  EKG: NSR. Inferolateral TW abnormality  ASSESSMENT AND PLAN:  76 year old female with history of CAD, mitral valve repair, prior stroke, multiple other cardiac risk factors who has been experiencing shortness of breath with exertion recently.  She underwent an echo that showed inferior hypokinesis and an EF of 45%.  Nuclear medicine stress test showed an apical defect.  We will proceed with heart catheterization with possible intervention for further evaluation of her symptoms.  The risk benefits were discussed at length with the patient who is agreeable to proceed.  Signed: Andrez Grime MD 03/10/2022, 9:14 AM

## 2022-03-10 NOTE — Progress Notes (Signed)
Dr. Corky Sox at bedside speaking with pt. And her husband Iona Beard re: cath results. Both pt. And spouse verbalized understanding of conversation.

## 2022-03-11 ENCOUNTER — Encounter: Payer: Self-pay | Admitting: Cardiology

## 2022-03-18 DIAGNOSIS — I2510 Atherosclerotic heart disease of native coronary artery without angina pectoris: Secondary | ICD-10-CM | POA: Diagnosis not present

## 2022-03-18 DIAGNOSIS — Z955 Presence of coronary angioplasty implant and graft: Secondary | ICD-10-CM | POA: Diagnosis not present

## 2022-03-18 DIAGNOSIS — I10 Essential (primary) hypertension: Secondary | ICD-10-CM | POA: Diagnosis not present

## 2022-03-18 DIAGNOSIS — I340 Nonrheumatic mitral (valve) insufficiency: Secondary | ICD-10-CM | POA: Diagnosis not present

## 2022-03-25 DIAGNOSIS — E876 Hypokalemia: Secondary | ICD-10-CM | POA: Diagnosis not present

## 2022-03-25 DIAGNOSIS — I10 Essential (primary) hypertension: Secondary | ICD-10-CM | POA: Diagnosis not present

## 2022-03-25 DIAGNOSIS — I2510 Atherosclerotic heart disease of native coronary artery without angina pectoris: Secondary | ICD-10-CM | POA: Diagnosis not present

## 2022-03-25 DIAGNOSIS — R7309 Other abnormal glucose: Secondary | ICD-10-CM | POA: Diagnosis not present

## 2022-03-29 DIAGNOSIS — H40003 Preglaucoma, unspecified, bilateral: Secondary | ICD-10-CM | POA: Diagnosis not present

## 2022-04-06 DIAGNOSIS — H26491 Other secondary cataract, right eye: Secondary | ICD-10-CM | POA: Diagnosis not present

## 2022-04-15 DIAGNOSIS — I5020 Unspecified systolic (congestive) heart failure: Secondary | ICD-10-CM | POA: Diagnosis not present

## 2022-04-15 DIAGNOSIS — Z952 Presence of prosthetic heart valve: Secondary | ICD-10-CM | POA: Diagnosis not present

## 2022-04-15 DIAGNOSIS — Z933 Colostomy status: Secondary | ICD-10-CM | POA: Diagnosis not present

## 2022-04-15 DIAGNOSIS — G4733 Obstructive sleep apnea (adult) (pediatric): Secondary | ICD-10-CM | POA: Diagnosis not present

## 2022-04-15 DIAGNOSIS — Z85038 Personal history of other malignant neoplasm of large intestine: Secondary | ICD-10-CM | POA: Diagnosis not present

## 2022-04-15 DIAGNOSIS — I10 Essential (primary) hypertension: Secondary | ICD-10-CM | POA: Diagnosis not present

## 2022-04-15 DIAGNOSIS — D132 Benign neoplasm of duodenum: Secondary | ICD-10-CM | POA: Diagnosis not present

## 2022-04-15 DIAGNOSIS — K219 Gastro-esophageal reflux disease without esophagitis: Secondary | ICD-10-CM | POA: Diagnosis not present

## 2022-04-20 ENCOUNTER — Encounter: Payer: Self-pay | Admitting: Internal Medicine

## 2022-04-21 ENCOUNTER — Ambulatory Visit
Admission: RE | Admit: 2022-04-21 | Discharge: 2022-04-21 | Disposition: A | Discharge status: Home / Self Care | Payer: No Typology Code available for payment source | Attending: Internal Medicine | Admitting: Internal Medicine

## 2022-04-21 ENCOUNTER — Encounter: Payer: Self-pay | Admitting: Internal Medicine

## 2022-04-21 ENCOUNTER — Encounter
Admission: RE | Disposition: A | Discharge status: Home / Self Care | Payer: Self-pay | Source: Home / Self Care | Attending: Internal Medicine

## 2022-04-21 ENCOUNTER — Ambulatory Visit: Payer: No Typology Code available for payment source | Admitting: Certified Registered Nurse Anesthetist

## 2022-04-21 DIAGNOSIS — Z79899 Other long term (current) drug therapy: Secondary | ICD-10-CM | POA: Insufficient documentation

## 2022-04-21 DIAGNOSIS — G4733 Obstructive sleep apnea (adult) (pediatric): Secondary | ICD-10-CM | POA: Diagnosis not present

## 2022-04-21 DIAGNOSIS — K219 Gastro-esophageal reflux disease without esophagitis: Secondary | ICD-10-CM | POA: Insufficient documentation

## 2022-04-21 DIAGNOSIS — K3189 Other diseases of stomach and duodenum: Secondary | ICD-10-CM | POA: Insufficient documentation

## 2022-04-21 DIAGNOSIS — N189 Chronic kidney disease, unspecified: Secondary | ICD-10-CM | POA: Insufficient documentation

## 2022-04-21 DIAGNOSIS — Z952 Presence of prosthetic heart valve: Secondary | ICD-10-CM | POA: Insufficient documentation

## 2022-04-21 DIAGNOSIS — I110 Hypertensive heart disease with heart failure: Secondary | ICD-10-CM | POA: Diagnosis not present

## 2022-04-21 DIAGNOSIS — I5020 Unspecified systolic (congestive) heart failure: Secondary | ICD-10-CM | POA: Diagnosis not present

## 2022-04-21 DIAGNOSIS — K2970 Gastritis, unspecified, without bleeding: Secondary | ICD-10-CM | POA: Insufficient documentation

## 2022-04-21 DIAGNOSIS — Z85038 Personal history of other malignant neoplasm of large intestine: Secondary | ICD-10-CM | POA: Insufficient documentation

## 2022-04-21 DIAGNOSIS — R1310 Dysphagia, unspecified: Secondary | ICD-10-CM | POA: Diagnosis not present

## 2022-04-21 DIAGNOSIS — I130 Hypertensive heart and chronic kidney disease with heart failure and stage 1 through stage 4 chronic kidney disease, or unspecified chronic kidney disease: Secondary | ICD-10-CM | POA: Insufficient documentation

## 2022-04-21 DIAGNOSIS — I509 Heart failure, unspecified: Secondary | ICD-10-CM | POA: Diagnosis not present

## 2022-04-21 DIAGNOSIS — K449 Diaphragmatic hernia without obstruction or gangrene: Secondary | ICD-10-CM | POA: Diagnosis not present

## 2022-04-21 HISTORY — PX: ESOPHAGOGASTRODUODENOSCOPY: SHX5428

## 2022-04-21 SURGERY — EGD (ESOPHAGOGASTRODUODENOSCOPY)
Anesthesia: General

## 2022-04-21 MED ORDER — PROPOFOL 10 MG/ML IV BOLUS
INTRAVENOUS | Status: DC | PRN
  Administered 2022-04-21: 50 mg via INTRAVENOUS

## 2022-04-21 MED ORDER — LIDOCAINE HCL (CARDIAC) PF 100 MG/5ML IV SOSY
PREFILLED_SYRINGE | INTRAVENOUS | Status: DC | PRN
  Administered 2022-04-21: 50 mg via INTRAVENOUS

## 2022-04-21 MED ORDER — SODIUM CHLORIDE 0.9 % IV SOLN
INTRAVENOUS | Status: DC
  Administered 2022-04-21: 1000 mL via INTRAVENOUS

## 2022-04-21 MED ORDER — LIDOCAINE HCL (PF) 2 % IJ SOLN
INTRAMUSCULAR | Status: AC
  Filled 2022-04-21: qty 5

## 2022-04-21 MED ORDER — PROPOFOL 10 MG/ML IV BOLUS
INTRAVENOUS | Status: AC
  Filled 2022-04-21: qty 20

## 2022-04-21 MED ORDER — PROPOFOL 500 MG/50ML IV EMUL
INTRAVENOUS | Status: DC | PRN
  Administered 2022-04-21: 120 ug/kg/min via INTRAVENOUS

## 2022-04-21 NOTE — Interval H&P Note (Signed)
History and Physical Interval Note:  04/21/2022 12:24 PM  Kendra Ritter  has presented today for surgery, with the diagnosis of Gastroesophageal reflux disease without esophagitis (K21.9).  The various methods of treatment have been discussed with the patient and family. After consideration of risks, benefits and other options for treatment, the patient has consented to  Procedure(s): ESOPHAGOGASTRODUODENOSCOPY (EGD) (N/A) as a surgical intervention.  The patient's history has been reviewed, patient examined, no change in status, stable for surgery.  I have reviewed the patient's chart and labs.  Questions were answered to the patient's satisfaction.     [place], [place]

## 2022-04-21 NOTE — Op Note (Signed)
[facility] Gastroenterology Patient Name: Kendra Ritter Procedure Date: 04/21/2022 1:04 PM MRN: [id number] Account #: [id number] Date of Birth: 04/19/1946 Admit Type: Outpatient Age: 76 Room: [facility] ENDO ROOM 2 Gender: Female Note Status: Finalized Instrument Name: Altamese Cabal Endoscope [id number] Procedure:             Upper GI endoscopy Indications:           Suspected gastro-esophageal reflux disease, Failure to                         respond to medical treatment Providers:             Benay Pike. Alice Reichert MD, MD Referring MD:          Baxter Kail. Rebeca Alert MD, MD (Referring MD) Medicines:             Propofol per Anesthesia Complications:         No immediate complications. Procedure:             Pre-Anesthesia Assessment:                        - The risks and benefits of the procedure and the                         sedation options and risks were discussed with the                         patient. All questions were answered and informed                         consent was obtained.                        - Patient identification and proposed procedure were                         verified prior to the procedure by the nurse. The                         procedure was verified in the procedure room.                        - ASA Grade Assessment: III - A patient with severe                         systemic disease.                        - After reviewing the risks and benefits, the patient                         was deemed in satisfactory condition to undergo the                         procedure.                        After obtaining informed consent, the endoscope was  passed under direct vision. Throughout the procedure,                         the patient's blood pressure, pulse, and oxygen                         saturations were monitored continuously. The Endoscope                         was introduced through the mouth, and advanced to the                          third part of duodenum. The upper GI endoscopy was                         accomplished without difficulty. The patient tolerated                         the procedure well. Findings:      The examined esophagus was normal. Biopsies were obtained from the       proximal and distal esophagus with cold forceps for histology of       suspected eosinophilic esophagitis.      A 2 cm hiatal hernia was present.      Diffuse nodular mucosa was found in the gastric body. Biopsies were       taken with a cold forceps for histology.      Patchy mild inflammation characterized by erosions and erythema was       found in the gastric antrum.      The examined duodenum was normal.      The exam was otherwise without abnormality. Impression:            - Normal esophagus. Biopsied.                        - 2 cm hiatal hernia.                        - Nodular mucosa in the gastric body. Biopsied.                        - Gastritis.                        - Normal examined duodenum.                        - The examination was otherwise normal. Recommendation:        - Patient has a contact number available for                         emergencies. The signs and symptoms of potential                         delayed complications were discussed with the patient.                         Return to normal activities tomorrow. Written  discharge instructions were provided to the patient.                        - Continue present medications.                        - Omeprazole '40mg'$  by mouth twice daily                        - Await pathology results.                        - Return to my office in 2 months.                        - Telephone GI office to schedule appointment.                        - The findings and recommendations were discussed with                         the patient. Procedure Code(s):     --- Professional ---                        [phone],  Esophagogastroduodenoscopy, flexible,                         transoral; with biopsy, single or multiple Diagnosis Code(s):     --- Professional ---                        K29.70, Gastritis, unspecified, without bleeding                        K31.89, Other diseases of stomach and duodenum                        K44.9, Diaphragmatic hernia without obstruction or                         gangrene CPT copyright 2019 American Medical Association. All rights reserved. The codes documented in this report are preliminary and upon coder review may  be revised to meet current compliance requirements. Efrain Sella MD, MD 04/21/2022 1:25:11 PM This report has been signed electronically. Number of Addenda: 0 Note Initiated On: 04/21/2022 1:04 PM Estimated Blood Loss:  Estimated blood loss: none.      [facility]

## 2022-04-21 NOTE — H&P (Signed)
Outpatient short stay form Pre-procedure 04/21/2022 12:23 PM Kendra Ritter K. Kendra Ritter, M.D.  Primary Physician: Letta Median, M.D.  Reason for visit:  GERD with suboptimal response to medical therapy.  History of present illness:    Kendra Ritter is a 76 y.o. female with a PMHx of hypertension was seen today for evaluation of:  Orders   Diagnoses and all orders for this visit:  Gastroesophageal reflux disease without esophagitis - omeprazole (PRILOSEC) 40 MG DR capsule; Take 1 capsule (40 mg total) by mouth 2 (two) times daily Take 1 tablet 30 mins before breakfast and 1 tablet 30 mins before dinner. - Ambulatory Referral to Upper Endoscopy  Heart failure with reduced left ventricular function (CMS-HCC)  Hypertension, accelerated Comments: uncontrolled today  Obstructive sleep apnea syndrome  Presence of prosthetic heart valve  Duodenal adenoma  Status post colostomy (CMS-HCC) - 2009  History of colon cancer  Today advised patient on the following:  1. Increase omeprazole to 40 mg 1 pill twice daily before meals. Her dosing on once daily is a little off and may be we can decrease the 40 mg twice daily to once daily in the morning if we can get consistent daily dosing and also control symptoms. 2. EGD with possible esophageal biopsies to rule out eosinophilic esophagitis. 3. Patient stopped one of her antihypertensives secondary to the diarrhea side effect, I have asked her to call doctors Linthavong/Orgel for advice on controlling her hypertension tension today with an alternative medication. Patient verbalized acknowledgment and intention to call her prescribing physicians. 4. The patient understands the nature of the planned procedure, indications, risks, alternatives and potential complications, including but no limited to bleeding, perforation, infection, damage to internal organs and oversedation. The patient wishes to proceed.    Current Facility-Administered  Medications:    0.9 %  sodium chloride infusion, , Intravenous, Continuous, Kendra Ritter, Benay Pike, MD, Last Rate: 20 mL/hr at 04/21/22 1214, 1,000 mL at 04/21/22 1214  Medications Prior to Admission  Medication Sig Dispense Refill Last Dose   amLODipine (NORVASC) 10 MG tablet Take 1 tablet (10 mg total) by mouth daily. 30 tablet 6 04/21/2022 at 0515   aspirin EC 81 MG EC tablet Take 1 tablet (81 mg total) by mouth daily.   04/20/2022   carvedilol (COREG) 25 MG tablet Take 25 mg by mouth 2 (two) times daily with a meal.   04/20/2022   cloNIDine (CATAPRES) 0.1 MG tablet Take 0.1 mg by mouth daily.    04/20/2022   Cyanocobalamin (B-12) 2500 MCG SUBL Place 2,500 mcg under the tongue daily.   Past Week   isosorbide mononitrate (IMDUR) 30 MG 24 hr tablet Take 30 mg by mouth daily.      Omega-3 Fatty Acids (FISH OIL) 1000 MG CAPS Take 1,000 mg by mouth daily.   04/20/2022   omeprazole (PRILOSEC) 40 MG capsule    04/20/2022   potassium chloride (K-DUR) 10 MEQ tablet    04/20/2022   prednisoLONE acetate (PRED FORTE) 1 % ophthalmic suspension APPLY TO THE OPERATIVE EYE, 1 DROP FOUR TIMES A DAY   04/20/2022   triamcinolone ointment (KENALOG) 0.1 %    04/20/2022   diphenoxylate-atropine (LOMOTIL) 2.5-0.025 MG tablet TAKE 2 TABLETS BY MOUTH 4 TIMES A DAY AS NEEDED FOR DIARRHEA OR LOOSE STOOLS 90 tablet 0    ferrous sulfate 325 (65 FE) MG tablet TAKE 1 TABLET (325 MG TOTAL) BY MOUTH 2 (TWO) TIMES DAILY WITH A MEAL. (Patient not taking: Reported on 04/21/2022) 180  tablet 1 Not Taking   gabapentin (NEURONTIN) 300 MG capsule Take 300 mg by mouth 3 (three) times daily.       hydrALAZINE (APRESOLINE) 25 MG tablet Take 25 mg by mouth 3 (three) times daily.  1    latanoprost (XALATAN) 0.005 % ophthalmic solution Place 1 drop into both eyes at bedtime.      losartan (COZAAR) 100 MG tablet  (Patient not taking: Reported on 03/10/2022)      methocarbamol (ROBAXIN) 500 MG tablet Take 1 tablet (500 mg total) by mouth 4 (four) times  daily. (Patient not taking: Reported on 04/21/2022) 16 tablet 0 Not Taking   ondansetron (ZOFRAN ODT) 4 MG disintegrating tablet Take 1 tablet (4 mg total) by mouth every 8 (eight) hours as needed. 20 tablet 0  at prn   predniSONE (DELTASONE) 10 MG tablet Take 1 tablet (10 mg total) by mouth as directed. (Patient not taking: Reported on 03/10/2022) 42 tablet 0    spironolactone (ALDACTONE) 25 MG tablet TAKE ONE TABLET BY MOUTH DAILY- TO REPLACE HCTZ (Patient not taking: Reported on 04/21/2022)  1 Not Taking     Allergies  Allergen Reactions   Lisinopril Anaphylaxis and Cough    Other reaction(s): Other (See Comments) Other Reaction: bad cough     Past Medical History:  Diagnosis Date   Anemia    BPPV (benign paroxysmal positional vertigo)    Cervical ca (HCC)    CHF (congestive heart failure) (HCC)    Chronic kidney disease    Colon cancer ([place]) 1998   GERD (gastroesophageal reflux disease)    H/O aortic valve replacement    Heart murmur    Hx of cervical cancer    Hypertension    Sleep apnea    Stroke [facility])    multiple strokes, 2010, 2011, 2013     Review of systems:  Otherwise negative.    Physical Exam  Gen: Alert, oriented. Appears stated age.  HEENT: [place]/AT. PERRLA. Lungs: CTA, no wheezes. CV: RR nl S1, S2. Abd: soft, benign, no masses. BS+ Ext: No edema. Pulses 2+    Planned procedures: Proceed with EGD with esophageal biopsies. The patient understands the nature of the planned procedure, indications, risks, alternatives and potential complications including but not limited to bleeding, infection, perforation, damage to internal organs and possible oversedation/side effects from anesthesia. The patient agrees and gives consent to proceed.  Please refer to procedure notes for findings, recommendations and patient disposition/instructions.     Davit Vassar K. Kendra Ritter, M.D. Gastroenterology 04/21/2022  12:23 PM

## 2022-04-21 NOTE — Anesthesia Postprocedure Evaluation (Signed)
Anesthesia Post Note  Patient: Kendra Ritter  Procedure(s) Performed: ESOPHAGOGASTRODUODENOSCOPY (EGD)  Patient location during evaluation: Endoscopy Anesthesia Type: General Level of consciousness: awake and alert Pain management: pain level controlled Vital Signs Assessment: post-procedure vital signs reviewed and stable Respiratory status: spontaneous breathing, nonlabored ventilation, respiratory function stable and patient connected to nasal cannula oxygen Cardiovascular status: blood pressure returned to baseline and stable Postop Assessment: no apparent nausea or vomiting Anesthetic complications: no   No notable events documented.   Last Vitals:  Vitals:   04/21/22 1157 04/21/22 1322  BP: (!) 158/84 124/68  Pulse: 67 61  Resp: 16 20  Temp: (!) 35.9 C 36.8 C  SpO2: 100% 100%    Last Pain:  Vitals:   04/21/22 1332  TempSrc:   PainSc: 0-No pain                 Arita Miss

## 2022-04-21 NOTE — Transfer of Care (Signed)
Immediate Anesthesia Transfer of Care Note  Patient: Kendra Ritter  Procedure(s) Performed: ESOPHAGOGASTRODUODENOSCOPY (EGD)  Patient Location: PACU  Anesthesia Type:General  Level of Consciousness: awake, alert  and oriented  Airway & Oxygen Therapy: Patient Spontanous Breathing  Post-op Assessment: Report given to RN and Post -op Vital signs reviewed and stable  Post vital signs: Reviewed and stable  Last Vitals:  Vitals Value Taken Time  BP 124/68 04/21/22 1322  Temp    Pulse 61 04/21/22 1322  Resp 22 04/21/22 1322  SpO2 99 % 04/21/22 1322  Vitals shown include unvalidated device data.  Last Pain:  Vitals:   04/21/22 1157  TempSrc: Temporal         Complications: No notable events documented.

## 2022-04-21 NOTE — Anesthesia Procedure Notes (Signed)
Procedure Name: MAC Date/Time: 04/21/2022 1:06 PM  Performed by: Tollie Eth, CRNAPre-anesthesia Checklist: Patient identified, Emergency Drugs available, Suction available and Patient being monitored Patient Re-evaluated:Patient Re-evaluated prior to induction Oxygen Delivery Method: Nasal cannula Induction Type: IV induction Placement Confirmation: positive ETCO2

## 2022-04-21 NOTE — Anesthesia Preprocedure Evaluation (Signed)
Anesthesia Evaluation  Patient identified by MRN, date of birth, ID band Patient awake    Reviewed: Allergy & Precautions, H&P , NPO status , Patient's Chart, lab work & pertinent test results  History of Anesthesia Complications Negative for: history of anesthetic complications  Airway Mallampati: III  TM Distance: <3 FB Neck ROM: limited    Dental no notable dental hx. (+) Chipped, Poor Dentition, Missing   Pulmonary neg shortness of breath, sleep apnea ,    Pulmonary exam normal breath sounds clear to auscultation       Cardiovascular Exercise Tolerance: Good hypertension, (-) angina+CHF  (-) Past MI + Valvular Problems/Murmurs  Rhythm:Regular Rate:Normal - Systolic murmurs Echo 06/13/39- MILD LV SYSTOLIC DYSFUNCTION (See above) WITH MODERATE LVH NORMAL RIGHT VENTRICULAR SYSTOLIC FUNCTION NO VALVULAR STENOSIS MODERATE MR, MILD MS (mean gradient 6 mmHg)- 26 mm Mitral ring present GRADE 2 DIASTOLIC DYSFUNCTION  Stress- 01/28/22- 1. Abnormal myocardial perfusion study 2. There is a moderate size, moderate in severity defect of the apical, and apical anterior/septal region that is primarily reversible. This is consistent with ischemia or possibly attenuation artifact. 3. 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  Normal left ventricular end-diastolic pressure, with normal left  ventricular systolic function.  Trivial mitral regurgitation by power injection  No evidence of aortic stenosis by pullback     Neuro/Psych TIACVA negative psych ROS   GI/Hepatic Neg liver ROS, GERD  ,  Endo/Other  negative endocrine ROS  Renal/GU Renal disease  negative genitourinary   Musculoskeletal   Abdominal   Peds  Hematology negative hematology ROS (+)   Anesthesia Other Findings Past Medical  History: No date: Anemia No date: BPPV (benign paroxysmal positional vertigo) No date: Cervical ca (HCC) No date: CHF (congestive heart failure) (HCC) No date: Chronic kidney disease 1998: Colon cancer ([place]) No date: GERD (gastroesophageal reflux disease) No date: H/O aortic valve replacement No date: Heart murmur No date: Hx of cervical cancer No date: Hypertension No date: Sleep apnea No date: Stroke [facility])     Comment:  multiple strokes, 2010, 2011, 2013   Past Surgical History: 2008: AORTIC VALVE REPLACEMENT     Comment:  [facility] No date: APPENDECTOMY 2008: CARDIAC CATHETERIZATION     Comment:  [facility] No date: COLON SURGERY     Comment:  colostoma bag No date: COLONOSCOPY WITH ESOPHAGOGASTRODUODENOSCOPY (EGD) 1998: COLOSTOMY     Comment:  After colon cancer  06/29/2019: ESOPHAGOGASTRODUODENOSCOPY (EGD) WITH PROPOFOL; N/A     Comment:  Procedure: ESOPHAGOGASTRODUODENOSCOPY (EGD) WITH               PROPOFOL;  Surgeon: Lin Landsman, MD;  Location:               [facility] ENDOSCOPY;  Service: Gastroenterology;  Laterality:               N/A; No date: partial hysterectomy     Comment:  cervical cancer     Reproductive/Obstetrics negative OB ROS                             Anesthesia Physical  Anesthesia Plan  ASA: 3  Anesthesia Plan: General   Post-op Pain Management: Minimal or no pain anticipated   Induction: Intravenous  PONV Risk Score and Plan: 3 and Propofol infusion and TIVA  Airway Management Planned: Natural  Airway and Nasal Cannula  Additional Equipment: None  Intra-op Plan:   Post-operative Plan:   Informed Consent: I have reviewed the patients History and Physical, chart, labs and discussed the procedure including the risks, benefits and alternatives for the proposed anesthesia with the patient or authorized representative who has indicated his/her understanding and acceptance.     Dental Advisory Given  Plan Discussed  with: Anesthesiologist, CRNA and Surgeon  Anesthesia Plan Comments: (Patient consented for risks of anesthesia including but not limited to:  - adverse reactions to medications - risk of intubation if required - damage to teeth, lips or other oral mucosa - sore throat or hoarseness - Damage to heart, brain, lungs or loss of life  Patient voiced understanding.)        Anesthesia Quick Evaluation

## 2022-04-22 LAB — SURGICAL PATHOLOGY

## 2022-05-17 DIAGNOSIS — Z934 Other artificial openings of gastrointestinal tract status: Secondary | ICD-10-CM | POA: Diagnosis not present

## 2022-05-17 DIAGNOSIS — Z933 Colostomy status: Secondary | ICD-10-CM | POA: Diagnosis not present

## 2022-06-10 DIAGNOSIS — Z85038 Personal history of other malignant neoplasm of large intestine: Secondary | ICD-10-CM | POA: Diagnosis not present

## 2022-06-10 DIAGNOSIS — K219 Gastro-esophageal reflux disease without esophagitis: Secondary | ICD-10-CM | POA: Diagnosis not present

## 2022-06-10 DIAGNOSIS — Z955 Presence of coronary angioplasty implant and graft: Secondary | ICD-10-CM | POA: Diagnosis not present

## 2022-06-24 DIAGNOSIS — M25561 Pain in right knee: Secondary | ICD-10-CM | POA: Diagnosis not present

## 2022-06-24 DIAGNOSIS — E1169 Type 2 diabetes mellitus with other specified complication: Secondary | ICD-10-CM | POA: Diagnosis not present

## 2022-06-24 DIAGNOSIS — E876 Hypokalemia: Secondary | ICD-10-CM | POA: Diagnosis not present

## 2022-06-24 DIAGNOSIS — G8929 Other chronic pain: Secondary | ICD-10-CM | POA: Diagnosis not present

## 2022-06-24 DIAGNOSIS — Z862 Personal history of diseases of the blood and blood-forming organs and certain disorders involving the immune mechanism: Secondary | ICD-10-CM | POA: Diagnosis not present

## 2022-06-24 DIAGNOSIS — I2510 Atherosclerotic heart disease of native coronary artery without angina pectoris: Secondary | ICD-10-CM | POA: Diagnosis not present

## 2022-06-24 DIAGNOSIS — Z933 Colostomy status: Secondary | ICD-10-CM | POA: Diagnosis not present

## 2022-06-24 DIAGNOSIS — E785 Hyperlipidemia, unspecified: Secondary | ICD-10-CM | POA: Diagnosis not present

## 2022-06-24 DIAGNOSIS — M25562 Pain in left knee: Secondary | ICD-10-CM | POA: Diagnosis not present

## 2022-06-24 DIAGNOSIS — I10 Essential (primary) hypertension: Secondary | ICD-10-CM | POA: Diagnosis not present

## 2022-07-08 DIAGNOSIS — E119 Type 2 diabetes mellitus without complications: Secondary | ICD-10-CM | POA: Diagnosis not present

## 2022-07-08 DIAGNOSIS — I10 Essential (primary) hypertension: Secondary | ICD-10-CM | POA: Diagnosis not present

## 2022-07-08 DIAGNOSIS — I2510 Atherosclerotic heart disease of native coronary artery without angina pectoris: Secondary | ICD-10-CM | POA: Diagnosis not present

## 2022-07-08 DIAGNOSIS — Z8673 Personal history of transient ischemic attack (TIA), and cerebral infarction without residual deficits: Secondary | ICD-10-CM | POA: Diagnosis not present

## 2022-07-08 DIAGNOSIS — Z9861 Coronary angioplasty status: Secondary | ICD-10-CM | POA: Diagnosis not present

## 2022-07-08 DIAGNOSIS — E876 Hypokalemia: Secondary | ICD-10-CM | POA: Diagnosis not present

## 2022-07-08 DIAGNOSIS — R0789 Other chest pain: Secondary | ICD-10-CM | POA: Diagnosis not present

## 2022-07-08 DIAGNOSIS — Z9889 Other specified postprocedural states: Secondary | ICD-10-CM | POA: Diagnosis not present

## 2022-07-08 DIAGNOSIS — E669 Obesity, unspecified: Secondary | ICD-10-CM | POA: Diagnosis not present

## 2022-07-08 DIAGNOSIS — H8110 Benign paroxysmal vertigo, unspecified ear: Secondary | ICD-10-CM | POA: Diagnosis not present

## 2022-07-08 DIAGNOSIS — I5020 Unspecified systolic (congestive) heart failure: Secondary | ICD-10-CM | POA: Diagnosis not present

## 2022-07-08 DIAGNOSIS — E782 Mixed hyperlipidemia: Secondary | ICD-10-CM | POA: Diagnosis not present

## 2022-07-28 DIAGNOSIS — I10 Essential (primary) hypertension: Secondary | ICD-10-CM | POA: Diagnosis not present

## 2022-07-28 DIAGNOSIS — M542 Cervicalgia: Secondary | ICD-10-CM | POA: Diagnosis not present

## 2022-07-28 DIAGNOSIS — E1169 Type 2 diabetes mellitus with other specified complication: Secondary | ICD-10-CM | POA: Diagnosis not present

## 2022-07-28 DIAGNOSIS — I2510 Atherosclerotic heart disease of native coronary artery without angina pectoris: Secondary | ICD-10-CM | POA: Diagnosis not present

## 2022-07-28 DIAGNOSIS — E785 Hyperlipidemia, unspecified: Secondary | ICD-10-CM | POA: Diagnosis not present

## 2022-08-13 DIAGNOSIS — Z934 Other artificial openings of gastrointestinal tract status: Secondary | ICD-10-CM | POA: Diagnosis not present

## 2022-08-13 DIAGNOSIS — Z933 Colostomy status: Secondary | ICD-10-CM | POA: Diagnosis not present

## 2022-08-18 DIAGNOSIS — H40003 Preglaucoma, unspecified, bilateral: Secondary | ICD-10-CM | POA: Diagnosis not present

## 2022-09-13 ENCOUNTER — Other Ambulatory Visit: Payer: Self-pay

## 2022-09-13 ENCOUNTER — Emergency Department
Admission: EM | Admit: 2022-09-13 | Discharge: 2022-09-13 | Disposition: A | Discharge status: Home / Self Care | Payer: No Typology Code available for payment source | Attending: Emergency Medicine | Admitting: Emergency Medicine

## 2022-09-13 ENCOUNTER — Emergency Department: Payer: No Typology Code available for payment source

## 2022-09-13 DIAGNOSIS — I110 Hypertensive heart disease with heart failure: Secondary | ICD-10-CM | POA: Insufficient documentation

## 2022-09-13 DIAGNOSIS — Z20822 Contact with and (suspected) exposure to covid-19: Secondary | ICD-10-CM | POA: Insufficient documentation

## 2022-09-13 DIAGNOSIS — J101 Influenza due to other identified influenza virus with other respiratory manifestations: Secondary | ICD-10-CM | POA: Diagnosis not present

## 2022-09-13 DIAGNOSIS — I509 Heart failure, unspecified: Secondary | ICD-10-CM | POA: Insufficient documentation

## 2022-09-13 DIAGNOSIS — R059 Cough, unspecified: Secondary | ICD-10-CM | POA: Diagnosis not present

## 2022-09-13 DIAGNOSIS — E876 Hypokalemia: Secondary | ICD-10-CM

## 2022-09-13 LAB — CBC WITH DIFFERENTIAL/PLATELET
Abs Immature Granulocytes: 0.05 10*3/uL (ref 0.00–0.07)
Basophils Absolute: 0 10*3/uL (ref 0.0–0.1)
Basophils Relative: 1 %
Eosinophils Absolute: 0 10*3/uL (ref 0.0–0.5)
Eosinophils Relative: 1 %
HCT: 37.2 % (ref 36.0–46.0)
Hemoglobin: 11.4 g/dL — ABNORMAL LOW (ref 12.0–15.0)
Immature Granulocytes: 1 %
Lymphocytes Relative: 7 %
Lymphs Abs: 0.6 10*3/uL — ABNORMAL LOW (ref 0.7–4.0)
MCH: 27.2 pg (ref 26.0–34.0)
MCHC: 30.6 g/dL (ref 30.0–36.0)
MCV: 88.8 fL (ref 80.0–100.0)
Monocytes Absolute: 0.7 10*3/uL (ref 0.1–1.0)
Monocytes Relative: 9 %
Neutro Abs: 6.8 10*3/uL (ref 1.7–7.7)
Neutrophils Relative %: 81 %
Platelets: 279 10*3/uL (ref 150–400)
RBC: 4.19 MIL/uL (ref 3.87–5.11)
RDW: 15.1 % (ref 11.5–15.5)
WBC: 8.3 10*3/uL (ref 4.0–10.5)
nRBC: 0 % (ref 0.0–0.2)

## 2022-09-13 LAB — BASIC METABOLIC PANEL
Anion gap: 10 (ref 5–15)
BUN: 14 mg/dL (ref 8–23)
CO2: 25 mmol/L (ref 22–32)
Calcium: 9 mg/dL (ref 8.9–10.3)
Chloride: 104 mmol/L (ref 98–111)
Creatinine, Ser: 1.21 mg/dL — ABNORMAL HIGH (ref 0.44–1.00)
GFR, Estimated: 47 mL/min — ABNORMAL LOW (ref >60)
Glucose, Bld: 112 mg/dL — ABNORMAL HIGH (ref 70–99)
Potassium: 3.1 mmol/L — ABNORMAL LOW (ref 3.5–5.1)
Sodium: 139 mmol/L (ref 135–145)

## 2022-09-13 LAB — RESP PANEL BY RT-PCR (FLU A&B, COVID) ARPGX2
Influenza A by PCR: POSITIVE — AB
Influenza B by PCR: NEGATIVE
SARS Coronavirus 2 by RT PCR: NEGATIVE

## 2022-09-13 MED ORDER — ACETAMINOPHEN 325 MG PO TABS
ORAL_TABLET | ORAL | Status: AC
  Filled 2022-09-13: qty 2

## 2022-09-13 MED ORDER — POTASSIUM CHLORIDE CRYS ER 20 MEQ PO TBCR: 20.0000 meq | EXTENDED_RELEASE_TABLET | Freq: Every day | ORAL | 0 refills | Status: AC

## 2022-09-13 MED ORDER — OSELTAMIVIR PHOSPHATE 30 MG PO CAPS: 30.0000 mg | ORAL_CAPSULE | Freq: Two times a day (BID) | ORAL | 0 refills | Status: AC

## 2022-09-13 MED ORDER — BENZONATATE 100 MG PO CAPS: 100.0000 mg | ORAL_CAPSULE | Freq: Three times a day (TID) | ORAL | 0 refills | Status: DC | PRN

## 2022-09-13 MED ORDER — ACETAMINOPHEN 325 MG PO TABS
650.0000 mg | ORAL_TABLET | Freq: Once | ORAL | Status: AC
  Administered 2022-09-13: 650 mg via ORAL

## 2022-09-13 MED ORDER — OSELTAMIVIR PHOSPHATE 75 MG PO CAPS
75.0000 mg | ORAL_CAPSULE | Freq: Once | ORAL | Status: AC
  Administered 2022-09-13: 75 mg via ORAL
  Filled 2022-09-13: qty 1

## 2022-09-13 NOTE — Discharge Instructions (Addendum)
You can take Tylenol 1 g every 8 hours to help with fever.  Take the Tamiflu to help with the flu.  Take the cough drops.  Return to the ER if you develop shortness of breath chest pain or any other concerns Potassium slightly low- take potassium pills to help and follow up recheck PCP

## 2022-09-13 NOTE — ED Provider Triage Note (Signed)
Emergency Medicine Provider Triage Evaluation Note  Kendra Ritter , a 76 y.o. female  was evaluated in triage.  Pt complains of cough, congestion, fever and body aches. No known covid exposure.  Taking otc medication for cold and cough HBP.  Did not take blood pressure meds today.   Review of Systems  Positive: + fever, cough, aches Negative: No N/V,D.    Physical Exam  BP (!) 125/111 (BP Location: Left Arm)   Pulse 95   Temp 100.1 F (37.8 C) (Oral)   Resp 18   Ht '4\' 11"'$  (1.499 m)   Wt 73.5 kg   SpO2 100%   BMI 32.72 kg/m  Gen:   Awake, no distress   Resp:  Normal effort  MSK:   Moves extremities without difficulty  Other:    Medical Decision Making  Medically screening exam initiated at 10:16 AM.  Appropriate orders placed.  Jasmine Awe was informed that the remainder of the evaluation will be completed by another provider, this initial triage assessment does not replace that evaluation, and the importance of remaining in the ED until their evaluation is complete.     Johnn Hai, PA-C 09/13/22 1020

## 2022-09-13 NOTE — ED Notes (Signed)
E-signature not working at this time. Pt verbalized understanding of D/C instructions, prescriptions and follow up care with no further questions at this time. Pt in NAD and ambulatory at time of D/C.  

## 2022-09-13 NOTE — ED Notes (Signed)
Says she has cough for few days non productive.  Tried to call pcp this am and could not get through.  She is alert and in nad.  No resp difficulty.

## 2022-09-13 NOTE — ED Triage Notes (Signed)
Pt in with co cough since last week, states has had fever.

## 2022-09-13 NOTE — ED Provider Notes (Signed)
[facility] Provider Note    Event Date/Time   First MD Initiated Contact with Patient 09/13/22 1122     (approximate)   History   Cough   HPI  ELLANOR FEUERSTEIN is a 76 y.o. female  with HTN, CHF who reports cough since Thursday morning. Decreased appetite and soreness. Drinking plenty of fluids. Intermittent fevers.  No new SOB. No new chest pain. NO sick contacts.  Did get flu shot.    Physical Exam   Triage Vital Signs: ED Triage Vitals  Enc Vitals Group     BP 09/13/22 1014 (!) 125/111     Pulse Rate 09/13/22 1014 95     Resp 09/13/22 1014 18     Temp 09/13/22 1014 100.1 F (37.8 C)     Temp Source 09/13/22 1014 Oral     SpO2 09/13/22 1014 100 %     Weight 09/13/22 1015 162 lb (73.5 kg)     Height 09/13/22 1015 '4\' 11"'$  (1.499 m)     Head Circumference --      Peak Flow --      Pain Score 09/13/22 1014 4     Pain Loc --      Pain Edu? --      Excl. in [place]? --     Most recent vital signs: Vitals:   09/13/22 1014  BP: (!) 125/111  Pulse: 95  Resp: 18  Temp: 100.1 F (37.8 C)  SpO2: 100%     General: Awake, no distress.  CV:  Good peripheral perfusion.  Resp:  Normal effort.  Abd:  No distention.  Soft and nontender Other:  No swelling in legs.  No calf tenderness   ED Results / Procedures / Treatments   Labs (all labs ordered are listed, but only abnormal results are displayed) Labs Reviewed  RESP PANEL BY RT-PCR (FLU A&B, COVID) ARPGX2 - Abnormal; Notable for the following components:      Result Value   Influenza A by PCR POSITIVE (*)    All other components within normal limits       RADIOLOGY I have reviewed the xray personally and interpretted and enlarged heart no PNA  Reviewed patient's prior EKG and she had cardiomegaly noted PROCEDURES:  Critical Care performed: No  Procedures   MEDICATIONS ORDERED IN ED: Medications  acetaminophen (TYLENOL) 325 MG tablet (has no administration in time range)   acetaminophen (TYLENOL) tablet 650 mg (650 mg Oral Given 09/13/22 1018)     IMPRESSION / MDM / [facility] / ED COURSE  I reviewed the triage vital signs and the nursing notes.   Patient's presentation is most consistent with acute presentation with potential threat to life or bodily function.   Patient comes in with concerns for multiple symptoms consistent with flu, COVID, RSV.  Patient to get labs drawn just due to her some decreased p.o. intake to ensure no significant AKI and given after discussion with patient about pros and cons of Tamiflu given her risk factors although she is already 5 days out patient was still like to proceed with the Tamiflu.  Given her creatinine is slightly elevated we will renally dose it.  Discussed with pharmacy and she will get 175 mg now and then 3 twice daily for 4 days.  We will also prescribe her some Tessalon Perles.  Patient denies any chest pain or shortness of breath to suggest cardiac work-up.  She understands that she is high risk given  her comorbidities but given she is already day 5 I hope that with the meditation she will start to feel better.  She understands return precautions and at this time feels comfortable with discharge home  BMP slightly low potassium we will give a little oral potassium for home.  Creatinine slightly elevated similar to prior.  CBC shows stable hemoglobin normal white count     FINAL CLINICAL IMPRESSION(S) / ED DIAGNOSES   Final diagnoses:  Influenza A     Rx / DC Orders   ED Discharge Orders          Ordered    benzonatate (TESSALON PERLES) 100 MG capsule  3 times daily PRN        09/13/22 1251    oseltamivir (TAMIFLU) 30 MG capsule  2 times daily        09/13/22 1251             Note:  This document was prepared using Dragon voice recognition software and may include unintentional dictation errors.   Vanessa [place], MD 09/13/22 1251

## 2022-09-16 DIAGNOSIS — Z8709 Personal history of other diseases of the respiratory system: Secondary | ICD-10-CM | POA: Diagnosis not present

## 2022-09-30 DIAGNOSIS — I10 Essential (primary) hypertension: Secondary | ICD-10-CM | POA: Diagnosis not present

## 2022-09-30 DIAGNOSIS — E785 Hyperlipidemia, unspecified: Secondary | ICD-10-CM | POA: Diagnosis not present

## 2022-09-30 DIAGNOSIS — I2510 Atherosclerotic heart disease of native coronary artery without angina pectoris: Secondary | ICD-10-CM | POA: Diagnosis not present

## 2022-09-30 DIAGNOSIS — E1169 Type 2 diabetes mellitus with other specified complication: Secondary | ICD-10-CM | POA: Diagnosis not present

## 2022-09-30 DIAGNOSIS — Z862 Personal history of diseases of the blood and blood-forming organs and certain disorders involving the immune mechanism: Secondary | ICD-10-CM | POA: Diagnosis not present

## 2022-10-07 DIAGNOSIS — I10 Essential (primary) hypertension: Secondary | ICD-10-CM | POA: Diagnosis not present

## 2022-10-07 DIAGNOSIS — E785 Hyperlipidemia, unspecified: Secondary | ICD-10-CM | POA: Diagnosis not present

## 2022-10-07 DIAGNOSIS — E876 Hypokalemia: Secondary | ICD-10-CM | POA: Diagnosis not present

## 2022-10-07 DIAGNOSIS — I2510 Atherosclerotic heart disease of native coronary artery without angina pectoris: Secondary | ICD-10-CM | POA: Diagnosis not present

## 2022-10-07 DIAGNOSIS — E1169 Type 2 diabetes mellitus with other specified complication: Secondary | ICD-10-CM | POA: Diagnosis not present

## 2022-10-07 DIAGNOSIS — Z0000 Encounter for general adult medical examination without abnormal findings: Secondary | ICD-10-CM | POA: Diagnosis not present

## 2022-10-07 DIAGNOSIS — N1832 Chronic kidney disease, stage 3b: Secondary | ICD-10-CM | POA: Diagnosis not present

## 2022-11-09 DIAGNOSIS — Z934 Other artificial openings of gastrointestinal tract status: Secondary | ICD-10-CM | POA: Diagnosis not present

## 2022-11-09 DIAGNOSIS — Z933 Colostomy status: Secondary | ICD-10-CM | POA: Diagnosis not present

## 2022-11-19 DIAGNOSIS — I5020 Unspecified systolic (congestive) heart failure: Secondary | ICD-10-CM | POA: Diagnosis not present

## 2022-11-19 DIAGNOSIS — I2510 Atherosclerotic heart disease of native coronary artery without angina pectoris: Secondary | ICD-10-CM | POA: Diagnosis not present

## 2022-11-19 DIAGNOSIS — Z952 Presence of prosthetic heart valve: Secondary | ICD-10-CM | POA: Diagnosis not present

## 2022-11-19 DIAGNOSIS — G4733 Obstructive sleep apnea (adult) (pediatric): Secondary | ICD-10-CM | POA: Diagnosis not present

## 2022-11-19 DIAGNOSIS — I10 Essential (primary) hypertension: Secondary | ICD-10-CM | POA: Diagnosis not present

## 2022-11-19 DIAGNOSIS — Z9861 Coronary angioplasty status: Secondary | ICD-10-CM | POA: Diagnosis not present

## 2022-11-19 DIAGNOSIS — E669 Obesity, unspecified: Secondary | ICD-10-CM | POA: Diagnosis not present

## 2022-11-19 DIAGNOSIS — H8110 Benign paroxysmal vertigo, unspecified ear: Secondary | ICD-10-CM | POA: Diagnosis not present

## 2022-11-19 DIAGNOSIS — I340 Nonrheumatic mitral (valve) insufficiency: Secondary | ICD-10-CM | POA: Diagnosis not present

## 2022-11-19 DIAGNOSIS — Z8673 Personal history of transient ischemic attack (TIA), and cerebral infarction without residual deficits: Secondary | ICD-10-CM | POA: Diagnosis not present

## 2022-11-19 DIAGNOSIS — Z9889 Other specified postprocedural states: Secondary | ICD-10-CM | POA: Diagnosis not present

## 2022-11-19 DIAGNOSIS — E782 Mixed hyperlipidemia: Secondary | ICD-10-CM | POA: Diagnosis not present

## 2022-12-31 DIAGNOSIS — E1169 Type 2 diabetes mellitus with other specified complication: Secondary | ICD-10-CM | POA: Diagnosis not present

## 2022-12-31 DIAGNOSIS — N1832 Chronic kidney disease, stage 3b: Secondary | ICD-10-CM | POA: Diagnosis not present

## 2022-12-31 DIAGNOSIS — M5442 Lumbago with sciatica, left side: Secondary | ICD-10-CM | POA: Diagnosis not present

## 2022-12-31 DIAGNOSIS — E785 Hyperlipidemia, unspecified: Secondary | ICD-10-CM | POA: Diagnosis not present

## 2022-12-31 DIAGNOSIS — I10 Essential (primary) hypertension: Secondary | ICD-10-CM | POA: Diagnosis not present

## 2023-02-10 DIAGNOSIS — Z934 Other artificial openings of gastrointestinal tract status: Secondary | ICD-10-CM | POA: Diagnosis not present

## 2023-02-10 DIAGNOSIS — Z933 Colostomy status: Secondary | ICD-10-CM | POA: Diagnosis not present

## 2023-02-11 DIAGNOSIS — M25572 Pain in left ankle and joints of left foot: Secondary | ICD-10-CM | POA: Diagnosis not present

## 2023-02-11 DIAGNOSIS — K219 Gastro-esophageal reflux disease without esophagitis: Secondary | ICD-10-CM | POA: Diagnosis not present

## 2023-02-11 DIAGNOSIS — N1832 Chronic kidney disease, stage 3b: Secondary | ICD-10-CM | POA: Diagnosis not present

## 2023-02-11 DIAGNOSIS — E1169 Type 2 diabetes mellitus with other specified complication: Secondary | ICD-10-CM | POA: Diagnosis not present

## 2023-02-11 DIAGNOSIS — R531 Weakness: Secondary | ICD-10-CM | POA: Diagnosis not present

## 2023-02-11 DIAGNOSIS — E785 Hyperlipidemia, unspecified: Secondary | ICD-10-CM | POA: Diagnosis not present

## 2023-02-18 ENCOUNTER — Other Ambulatory Visit (HOSPITAL_COMMUNITY): Payer: Self-pay | Admitting: Nurse Practitioner

## 2023-02-18 ENCOUNTER — Ambulatory Visit (HOSPITAL_COMMUNITY)
Admission: RE | Admit: 2023-02-18 | Discharge: 2023-02-18 | Disposition: A | Discharge status: Home / Self Care | Payer: No Typology Code available for payment source | Source: Ambulatory Visit | Attending: Nurse Practitioner | Admitting: Nurse Practitioner

## 2023-02-18 DIAGNOSIS — K435 Parastomal hernia without obstruction or  gangrene: Secondary | ICD-10-CM | POA: Diagnosis not present

## 2023-02-18 DIAGNOSIS — R194 Change in bowel habit: Secondary | ICD-10-CM | POA: Diagnosis not present

## 2023-02-18 DIAGNOSIS — Z8249 Family history of ischemic heart disease and other diseases of the circulatory system: Secondary | ICD-10-CM | POA: Diagnosis not present

## 2023-02-18 DIAGNOSIS — Z933 Colostomy status: Secondary | ICD-10-CM | POA: Diagnosis present

## 2023-02-18 DIAGNOSIS — K9400 Colostomy complication, unspecified: Secondary | ICD-10-CM | POA: Insufficient documentation

## 2023-02-18 DIAGNOSIS — Z433 Encounter for attention to colostomy: Secondary | ICD-10-CM | POA: Diagnosis not present

## 2023-02-18 DIAGNOSIS — N189 Chronic kidney disease, unspecified: Secondary | ICD-10-CM | POA: Diagnosis not present

## 2023-02-18 DIAGNOSIS — L24B3 Irritant contact dermatitis related to fecal or urinary stoma or fistula: Secondary | ICD-10-CM

## 2023-02-18 DIAGNOSIS — I130 Hypertensive heart and chronic kidney disease with heart failure and stage 1 through stage 4 chronic kidney disease, or unspecified chronic kidney disease: Secondary | ICD-10-CM | POA: Insufficient documentation

## 2023-02-18 DIAGNOSIS — K4020 Bilateral inguinal hernia, without obstruction or gangrene, not specified as recurrent: Secondary | ICD-10-CM | POA: Insufficient documentation

## 2023-02-18 DIAGNOSIS — E1122 Type 2 diabetes mellitus with diabetic chronic kidney disease: Secondary | ICD-10-CM | POA: Insufficient documentation

## 2023-02-18 NOTE — Progress Notes (Signed)
[facility]   Reason for visit:  LLQ colostomy for several years.  Has recently noted increased leaks and alternating stool consistency.  Takes no laxatives or bulk fiber.  Manages diabetes with diet.   HPI:   Past Medical History:  Diagnosis Date   Anemia    BPPV (benign paroxysmal positional vertigo)    Cervical ca (HCC)    CHF (congestive heart failure) (HCC)    Chronic kidney disease    Colon cancer (HCC) 1998   GERD (gastroesophageal reflux disease)    H/O aortic valve replacement    Heart murmur    Hx of cervical cancer    Hypertension    Sleep apnea    Stroke [facility])    multiple strokes, 2010, 2011, 2013    Family History  Problem Relation Age of Onset   Heart disease Mother    Heart attack Mother    Hypertension Mother    Breast cancer Maternal Aunt    Allergies  Allergen Reactions   Lisinopril Anaphylaxis and Cough    Other reaction(s): Other (See Comments) Other Reaction: bad cough   Current Outpatient Medications  Medication Sig Dispense Refill Last Dose   amLODipine (NORVASC) 10 MG tablet Take 1 tablet (10 mg total) by mouth daily. 30 tablet 6    aspirin EC 81 MG EC tablet Take 1 tablet (81 mg total) by mouth daily.      benzonatate (TESSALON PERLES) 100 MG capsule Take 1 capsule (100 mg total) by mouth 3 (three) times daily as needed for cough. 30 capsule 0    carvedilol (COREG) 25 MG tablet Take 25 mg by mouth 2 (two) times daily with a meal.      cloNIDine (CATAPRES) 0.1 MG tablet Take 0.1 mg by mouth daily.       Cyanocobalamin (B-12) 2500 MCG SUBL Place 2,500 mcg under the tongue daily.      diphenoxylate-atropine (LOMOTIL) 2.5-0.025 MG tablet TAKE 2 TABLETS BY MOUTH 4 TIMES A DAY AS NEEDED FOR DIARRHEA OR LOOSE STOOLS 90 tablet 0    ferrous sulfate 325 (65 FE) MG tablet TAKE 1 TABLET (325 MG TOTAL) BY MOUTH 2 (TWO) TIMES DAILY WITH A MEAL. (Patient not taking: Reported on 04/21/2022) 180 tablet 1    gabapentin (NEURONTIN) 300 MG capsule Take  300 mg by mouth 3 (three) times daily.       hydrALAZINE (APRESOLINE) 25 MG tablet Take 25 mg by mouth 3 (three) times daily.  1    isosorbide mononitrate (IMDUR) 30 MG 24 hr tablet Take 30 mg by mouth daily.      latanoprost (XALATAN) 0.005 % ophthalmic solution Place 1 drop into both eyes at bedtime.      losartan (COZAAR) 100 MG tablet  (Patient not taking: Reported on 03/10/2022)      methocarbamol (ROBAXIN) 500 MG tablet Take 1 tablet (500 mg total) by mouth 4 (four) times daily. (Patient not taking: Reported on 04/21/2022) 16 tablet 0    Omega-3 Fatty Acids (FISH OIL) 1000 MG CAPS Take 1,000 mg by mouth daily.      omeprazole (PRILOSEC) 40 MG capsule       ondansetron (ZOFRAN ODT) 4 MG disintegrating tablet Take 1 tablet (4 mg total) by mouth every 8 (eight) hours as needed. 20 tablet 0    potassium chloride SA (KLOR-CON M) 20 MEQ tablet Take 1 tablet (20 mEq total) by mouth daily for 3 days. 3 tablet 0    prednisoLONE acetate (  PRED FORTE) 1 % ophthalmic suspension APPLY TO THE OPERATIVE EYE, 1 DROP FOUR TIMES A DAY      predniSONE (DELTASONE) 10 MG tablet Take 1 tablet (10 mg total) by mouth as directed. (Patient not taking: Reported on 03/10/2022) 42 tablet 0    spironolactone (ALDACTONE) 25 MG tablet TAKE ONE TABLET BY MOUTH DAILY- TO REPLACE HCTZ (Patient not taking: Reported on 04/21/2022)  1    triamcinolone ointment (KENALOG) 0.1 %       No current facility-administered medications for this encounter.   ROS  Review of Systems  Eyes:  Positive for visual disturbance.       Difficulty cutting barier  Gastrointestinal:        LLQ colostomy Change in bowel habits  Skin:  Positive for color change and rash.  All other systems reviewed and are negative.  Vital signs:  BP (!) 174/99 (BP Location: Right Arm)   Pulse 69   Temp 98.1 F (36.7 C) (Oral)   Resp 20   SpO2 99%  Exam:  Physical Exam Vitals reviewed.  Constitutional:      Appearance: Normal appearance.  Abdominal:      Palpations: Abdomen is soft.     Hernia: A hernia is present.  Skin:    General: Skin is warm and dry.     Findings: Erythema present.  Neurological:     Mental Status: She is alert and oriented to person, place, and time.  Psychiatric:        Mood and Affect: Mood normal.        Behavior: Behavior normal.     Stoma type/location:  LLQ colostomy Stomal assessment/size:  7/8" budded with bleeding at mucocutaneous junction.  Pink and moist Peristomal assessment:  Large parastomal hernia at 3 o'clock  photo in chart.  Feels when she moves, pouch "pops off"  Treatment options for stomal/peristomal skin: Adding stoma powder and skin prep to bleeding, irritated skin.  Adding barrier ring to promote seal.  Uses convatec pouch.  Due to visual difficulty, would benefit from presized pouch.  Adding an ostomy belt to support around hernia.  Output: soft brown stool in pouch  Ostomy pouching: 2pc.  Flat  does not wish to change at this time, but will switch to same pouch with filter (and transparent, not opaque)  Updated order and provided her with item numbers Education provided:  Using Convatec  Pouch 1 3/4" [id number]  wants to switch to filter  [id number] Barrier 1 3/4" [id number]  switch to presize  [id number] Add belt [id number] Add barrier ring Continue stoma powder and skin prep I will update [facility]ram    Impression/dx  Parastomal hernia Colostomy Contact dermatitis Discussion  See back as needed.  Plan  Will update [facility]ram for ongoing supply needs.  She is appreciative of the help.  I place her in a belt and she states it "feels better already"     Visit time: 55 minutes.   Maple Hudson FNP-BC

## 2023-02-18 NOTE — Discharge Instructions (Signed)
Using Convatec  Pouch 1 3/4" [id number]  wants to switch to filter  [id number] Barrier 1 3/4" [id number]  switch to presize  [id number] Add belt [id number] Add barrier ring Continue stoma powder and skin prep I will update Byram

## 2023-02-23 DIAGNOSIS — H40003 Preglaucoma, unspecified, bilateral: Secondary | ICD-10-CM | POA: Diagnosis not present

## 2023-03-14 DIAGNOSIS — H40003 Preglaucoma, unspecified, bilateral: Secondary | ICD-10-CM | POA: Diagnosis not present

## 2023-04-11 DIAGNOSIS — I10 Essential (primary) hypertension: Secondary | ICD-10-CM | POA: Diagnosis not present

## 2023-04-11 DIAGNOSIS — E785 Hyperlipidemia, unspecified: Secondary | ICD-10-CM | POA: Diagnosis not present

## 2023-04-11 DIAGNOSIS — I2510 Atherosclerotic heart disease of native coronary artery without angina pectoris: Secondary | ICD-10-CM | POA: Diagnosis not present

## 2023-04-11 DIAGNOSIS — E1169 Type 2 diabetes mellitus with other specified complication: Secondary | ICD-10-CM | POA: Diagnosis not present

## 2023-04-12 ENCOUNTER — Emergency Department
Admission: EM | Admit: 2023-04-12 | Discharge: 2023-04-12 | Disposition: A | Discharge status: Home / Self Care | Payer: No Typology Code available for payment source | Attending: Emergency Medicine | Admitting: Emergency Medicine

## 2023-04-12 ENCOUNTER — Encounter: Payer: Self-pay | Admitting: Emergency Medicine

## 2023-04-12 ENCOUNTER — Emergency Department: Payer: No Typology Code available for payment source

## 2023-04-12 ENCOUNTER — Other Ambulatory Visit: Payer: Self-pay

## 2023-04-12 DIAGNOSIS — W19XXXA Unspecified fall, initial encounter: Secondary | ICD-10-CM | POA: Insufficient documentation

## 2023-04-12 DIAGNOSIS — M79672 Pain in left foot: Secondary | ICD-10-CM | POA: Insufficient documentation

## 2023-04-12 DIAGNOSIS — M25572 Pain in left ankle and joints of left foot: Secondary | ICD-10-CM | POA: Diagnosis not present

## 2023-04-12 DIAGNOSIS — M7732 Calcaneal spur, left foot: Secondary | ICD-10-CM | POA: Diagnosis not present

## 2023-04-12 DIAGNOSIS — M19072 Primary osteoarthritis, left ankle and foot: Secondary | ICD-10-CM | POA: Diagnosis not present

## 2023-04-12 DIAGNOSIS — R600 Localized edema: Secondary | ICD-10-CM | POA: Insufficient documentation

## 2023-04-12 MED ORDER — HYDROCODONE-ACETAMINOPHEN 5-325 MG PO TABS: ORAL_TABLET | ORAL | 0 refills | Status: AC

## 2023-04-12 NOTE — ED Notes (Signed)
See triage note  Presents with left foot/ankle pain  States she has had several falls and was told that she had "torn the tendons"  States she is having more pain  Swelling noted

## 2023-04-12 NOTE — ED Triage Notes (Signed)
Pt here with left foot pain. Pt states she fell recently a few weeks ago and was told she had a fx in her left foot. Pt states the pain and swelling are getting worse.

## 2023-04-12 NOTE — ED Provider Notes (Signed)
[facility] Provider Note    Event Date/Time   First MD Initiated Contact with Patient 04/12/23 [phone]     (approximate)   History   Foot Pain   HPI  Kendra Ritter is a 77 y.o. female   presents to the ED with complaint of left foot and ankle pain.  Patient states that she fell approximately 3 weeks ago and was seen by her PCP.  She was told that she most likely had some tendon injury but she states that is continued to get worse and the swelling does not go down overnight.  Patient reports there was no x-rays done at that visit.  She reports that this was a mechanical fall and denies any head injury or loss of consciousness.      Physical Exam   Triage Vital Signs: ED Triage Vitals  Enc Vitals Group     BP 04/12/23 0841 (!) 184/98     Pulse Rate 04/12/23 0841 74     Resp 04/12/23 0841 18     Temp 04/12/23 0841 98.6 F (37 C)     Temp Source 04/12/23 0841 Oral     SpO2 04/12/23 0841 97 %     Weight 04/12/23 0842 162 lb 0.6 oz (73.5 kg)     Height 04/12/23 0842 4\' 11"  (1.499 m)     Head Circumference --      Peak Flow --      Pain Score 04/12/23 0842 8     Pain Loc --      Pain Edu? --      Excl. in GC? --     Most recent vital signs: Vitals:   04/12/23 0841 04/12/23 1114  BP: (!) 184/98 (!) 178/80  Pulse: 74 70  Resp: 18 18  Temp: 98.6 F (37 C)   SpO2: 97% 97%     General: Awake, no distress.  CV:  Good peripheral perfusion.  Resp:  Normal effort.  Abd:  No distention.  Other:  Left ankle/foot with moderate soft tissue edema generalized when compared with the right foot and ankle.  Markedly tender to light palpation generalized over the left ankle and foot.  Skin is intact.  No discoloration is present.  Pulses are present.  Motor or sensory function intact.   ED Results / Procedures / Treatments   Labs (all labs ordered are listed, but only abnormal results are displayed) Labs Reviewed - No data to  display    RADIOLOGY X-ray images of the left foot and ankle were reviewed and interpreted by myself independent of the radiologist and no fracture or dislocation was noted.  Official radiology report agrees with no fracture but does mention calcification of the Achilles tendon and degenerative changes.    PROCEDURES:  Critical Care performed:   Procedures   MEDICATIONS ORDERED IN ED: Medications - No data to display   IMPRESSION / MDM / ASSESSMENT AND PLAN / ED COURSE  I reviewed the triage vital signs and the nursing notes.   Differential diagnosis includes, but is not limited to, ankle/foot fracture, dislocation, sprain, contusion, degenerative joint disease, tendinitis, tendon injury.  77 year old female presents to the ED with complaint of continued left foot pain after a fall that occurred 3 weeks ago.  X-rays were reassuring and patient was made aware that there was no fracture or dislocation.  She was placed in a cam walker and she has a walker at home that she is encouraged to use  for added stability.  A prescription for hydrocodone was sent to the pharmacy for her to take as needed for pain especially at night when she is unable to sleep due to pain.  Ice and elevation.  She is to call make an appointment with Dr. Logan Bores who is on-call for podiatry for further evaluation of her foot pain.      Patient's presentation is most consistent with acute complicated illness / injury requiring diagnostic workup.  FINAL CLINICAL IMPRESSION(S) / ED DIAGNOSES   Final diagnoses:  Acute foot pain, left  Acute left ankle pain  Fall, initial encounter     Rx / DC Orders   ED Discharge Orders          Ordered    HYDROcodone-acetaminophen (NORCO/VICODIN) 5-325 MG tablet        04/12/23 1123             Note:  This document was prepared using Dragon voice recognition software and may include unintentional dictation errors.   Tommi Rumps, PA-C 04/12/23 1435     Pilar Jarvis, MD 04/12/23 [phone]

## 2023-04-12 NOTE — Discharge Instructions (Addendum)
Call and make an appointment with Dr. Logan Bores who is on-call for podiatry.  His office information and address are listed on your discharge papers.  Elevate foot as needed for swelling.  Take pain medication only as directed and be aware that this could make you drowsy and increase your risk for falling.  Wear the cam walker boot anytime you are up walking for protection and support of your foot.  You may need to also use your walker to add support while wearing the cam walker boot.

## 2023-04-15 DIAGNOSIS — Z933 Colostomy status: Secondary | ICD-10-CM | POA: Diagnosis not present

## 2023-04-15 DIAGNOSIS — I10 Essential (primary) hypertension: Secondary | ICD-10-CM | POA: Diagnosis not present

## 2023-04-15 DIAGNOSIS — M25561 Pain in right knee: Secondary | ICD-10-CM | POA: Diagnosis not present

## 2023-04-15 DIAGNOSIS — G8929 Other chronic pain: Secondary | ICD-10-CM | POA: Diagnosis not present

## 2023-04-15 DIAGNOSIS — I2510 Atherosclerotic heart disease of native coronary artery without angina pectoris: Secondary | ICD-10-CM | POA: Diagnosis not present

## 2023-04-15 DIAGNOSIS — E1169 Type 2 diabetes mellitus with other specified complication: Secondary | ICD-10-CM | POA: Diagnosis not present

## 2023-04-15 DIAGNOSIS — M25562 Pain in left knee: Secondary | ICD-10-CM | POA: Diagnosis not present

## 2023-04-15 DIAGNOSIS — E785 Hyperlipidemia, unspecified: Secondary | ICD-10-CM | POA: Diagnosis not present

## 2023-05-03 ENCOUNTER — Ambulatory Visit: Payer: No Typology Code available for payment source | Admitting: Podiatry

## 2023-05-12 DIAGNOSIS — Z933 Colostomy status: Secondary | ICD-10-CM | POA: Diagnosis not present

## 2023-05-12 DIAGNOSIS — Z934 Other artificial openings of gastrointestinal tract status: Secondary | ICD-10-CM | POA: Diagnosis not present

## 2023-05-17 ENCOUNTER — Ambulatory Visit (INDEPENDENT_AMBULATORY_CARE_PROVIDER_SITE_OTHER): Payer: No Typology Code available for payment source | Admitting: Podiatry

## 2023-05-17 ENCOUNTER — Encounter: Payer: Self-pay | Admitting: Podiatry

## 2023-05-17 DIAGNOSIS — M19072 Primary osteoarthritis, left ankle and foot: Secondary | ICD-10-CM

## 2023-05-17 MED ORDER — BETAMETHASONE SOD PHOS & ACET 6 (3-3) MG/ML IJ SUSP
3.0000 mg | Freq: Once | INTRAMUSCULAR | Status: AC
  Administered 2023-05-17: 3 mg via INTRA_ARTICULAR

## 2023-05-17 NOTE — Progress Notes (Signed)
Chief Complaint  Patient presents with   Foot Pain    "I have pain, numbness, swelling, and tingling in my left foot.  It's starting to do that in my right foot now too.  My balance is off and I fall a lot." N - ankle and arch pain L - bilateral ankle and arch D - 3 mos O - gradually worse, left > right C - dull ache, burning, tingling, numbness, Diabetic A - when I'm in bed, numb when I get up causes me to fall T - ER, xrays, prescribed the boot and a walker, recommended I come here    HPI: 77 y.o. female presenting today as a new patient for evaluation of pain and tenderness associated to the left foot ongoing for a few months now.  Patient states that she went to the emergency department on 04/12/2023 and a cam boot was dispensed.  She has been WBAT in the cam boot since that time.  She says that she feels much better.  Denies a history of injury.  She does relate a history of falls and instability during gait.  She uses a walker for ambulation.  Past Medical History:  Diagnosis Date   Anemia    BPPV (benign paroxysmal positional vertigo)    Cervical ca (HCC)    CHF (congestive heart failure) (HCC)    Chronic kidney disease    Colon cancer (HCC) 1998   GERD (gastroesophageal reflux disease)    H/O aortic valve replacement    Heart murmur    Hx of cervical cancer    Hypertension    Sleep apnea    Stroke [facility])    multiple strokes, 2010, 2011, 2013     Past Surgical History:  Procedure Laterality Date   AORTIC VALVE REPLACEMENT  2008   [facility]   APPENDECTOMY     CARDIAC CATHETERIZATION  2008   [facility]   CHOLECYSTECTOMY     COLON SURGERY     colostoma bag   COLONOSCOPY WITH ESOPHAGOGASTRODUODENOSCOPY (EGD)     COLOSTOMY  1998   After colon cancer    DIAGNOSTIC LAPAROSCOPY     ESOPHAGOGASTRODUODENOSCOPY N/A 04/21/2022   Procedure: ESOPHAGOGASTRODUODENOSCOPY (EGD);  Surgeon: Toledo, Boykin Nearing, MD;  Location: [facility] ENDOSCOPY;  Service: Gastroenterology;  Laterality: N/A;    ESOPHAGOGASTRODUODENOSCOPY (EGD) WITH PROPOFOL N/A 06/29/2019   Procedure: ESOPHAGOGASTRODUODENOSCOPY (EGD) WITH PROPOFOL;  Surgeon: Toney Reil, MD;  Location: [facility] ENDOSCOPY;  Service: Gastroenterology;  Laterality: N/A;   ESOPHAGOGASTRODUODENOSCOPY (EGD) WITH PROPOFOL N/A 08/03/2019   Procedure: ESOPHAGOGASTRODUODENOSCOPY (EGD) WITH PROPOFOL;  Surgeon: Toney Reil, MD;  Location: [facility] ENDOSCOPY;  Service: Gastroenterology;  Laterality: N/A;   LEFT HEART CATH AND CORONARY ANGIOGRAPHY N/A 03/10/2022   Procedure: LEFT HEART CATH AND CORONARY ANGIOGRAPHY poss Intervention;  Surgeon: Armando Reichert, MD;  Location: [facility] INVASIVE CV LAB;  Service: Cardiovascular;  Laterality: N/A;   partial hysterectomy     cervical cancer    Allergies  Allergen Reactions   Lisinopril Anaphylaxis and Cough    Other reaction(s): Other (See Comments) Other Reaction: bad cough     Physical Exam: General: The patient is alert and oriented x3 in no acute distress.  Dermatology: Skin is warm, dry and supple bilateral lower extremities.   Vascular: Palpable pedal pulses bilaterally. Capillary refill within normal limits.  No appreciable edema.  No erythema.  Neurological: Grossly intact via light touch  Musculoskeletal Exam: No pedal deformities noted.  Tenderness with palpation noted around the TMT  joint especially to the left foot   DG Foot and Ankle Complete Left 04/12/2023 [facility] ED IMPRESSION: Global soft tissue swelling. Scattered degenerative changes of the foot. No apparent acute fracture. Thickening and calcifications along the course of the Achilles tendon. Please correlate for any history of tendonitis. Additional workup as clinically appropriate such as MRI.     Assessment/Plan of Care: 1.  Arthritis/DJD left midfoot  -Patient evaluated.  X-rays of the left foot and ankle taken 04/12/2023 were reviewed. -Injection of 0.5 cc Celestone Soluspan injected around the Lisfranc joint  left TMT. -Patient may discontinue the cam boot.  Recommend good supportive tennis shoes and sneakers.  Advised against going barefoot -Continue walker as needed -Continue Tylenol as needed -Return to clinic as needed       Felecia Shelling, DPM [facility]  Dr. Felecia Shelling, DPM    [address] [place], [place] [address]                Office [phone]  Fax [phone]

## 2023-05-20 DIAGNOSIS — Z955 Presence of coronary angioplasty implant and graft: Secondary | ICD-10-CM | POA: Diagnosis not present

## 2023-05-20 DIAGNOSIS — K219 Gastro-esophageal reflux disease without esophagitis: Secondary | ICD-10-CM | POA: Diagnosis not present

## 2023-05-20 DIAGNOSIS — I340 Nonrheumatic mitral (valve) insufficiency: Secondary | ICD-10-CM | POA: Diagnosis not present

## 2023-05-20 DIAGNOSIS — I2510 Atherosclerotic heart disease of native coronary artery without angina pectoris: Secondary | ICD-10-CM | POA: Diagnosis not present

## 2023-05-20 DIAGNOSIS — N1831 Chronic kidney disease, stage 3a: Secondary | ICD-10-CM | POA: Diagnosis not present

## 2023-05-20 DIAGNOSIS — G4733 Obstructive sleep apnea (adult) (pediatric): Secondary | ICD-10-CM | POA: Diagnosis not present

## 2023-05-20 DIAGNOSIS — I5020 Unspecified systolic (congestive) heart failure: Secondary | ICD-10-CM | POA: Diagnosis not present

## 2023-05-20 DIAGNOSIS — I10 Essential (primary) hypertension: Secondary | ICD-10-CM | POA: Diagnosis not present

## 2023-06-21 DIAGNOSIS — M542 Cervicalgia: Secondary | ICD-10-CM | POA: Diagnosis not present

## 2023-06-21 DIAGNOSIS — M25512 Pain in left shoulder: Secondary | ICD-10-CM | POA: Diagnosis not present

## 2023-06-21 DIAGNOSIS — M50321 Other cervical disc degeneration at C4-C5 level: Secondary | ICD-10-CM | POA: Diagnosis not present

## 2023-06-21 DIAGNOSIS — M47812 Spondylosis without myelopathy or radiculopathy, cervical region: Secondary | ICD-10-CM | POA: Diagnosis not present

## 2023-06-21 DIAGNOSIS — M4312 Spondylolisthesis, cervical region: Secondary | ICD-10-CM | POA: Diagnosis not present

## 2023-06-21 DIAGNOSIS — M19012 Primary osteoarthritis, left shoulder: Secondary | ICD-10-CM | POA: Diagnosis not present

## 2023-07-21 DIAGNOSIS — M150 Primary generalized (osteo)arthritis: Secondary | ICD-10-CM | POA: Diagnosis not present

## 2023-07-21 DIAGNOSIS — Z780 Asymptomatic menopausal state: Secondary | ICD-10-CM | POA: Diagnosis not present

## 2023-07-21 DIAGNOSIS — I10 Essential (primary) hypertension: Secondary | ICD-10-CM | POA: Diagnosis not present

## 2023-08-02 DIAGNOSIS — M8588 Other specified disorders of bone density and structure, other site: Secondary | ICD-10-CM | POA: Diagnosis not present

## 2023-08-09 DIAGNOSIS — M542 Cervicalgia: Secondary | ICD-10-CM | POA: Diagnosis not present

## 2023-08-10 DIAGNOSIS — Z934 Other artificial openings of gastrointestinal tract status: Secondary | ICD-10-CM | POA: Diagnosis not present

## 2023-08-10 DIAGNOSIS — Z933 Colostomy status: Secondary | ICD-10-CM | POA: Diagnosis not present

## 2023-08-17 DIAGNOSIS — M542 Cervicalgia: Secondary | ICD-10-CM | POA: Diagnosis not present

## 2023-08-25 DIAGNOSIS — M542 Cervicalgia: Secondary | ICD-10-CM | POA: Diagnosis not present

## 2023-08-30 DIAGNOSIS — M542 Cervicalgia: Secondary | ICD-10-CM | POA: Diagnosis not present

## 2023-09-19 DIAGNOSIS — H2511 Age-related nuclear cataract, right eye: Secondary | ICD-10-CM | POA: Diagnosis not present

## 2023-09-19 DIAGNOSIS — H40003 Preglaucoma, unspecified, bilateral: Secondary | ICD-10-CM | POA: Diagnosis not present

## 2023-09-29 DIAGNOSIS — I5020 Unspecified systolic (congestive) heart failure: Secondary | ICD-10-CM | POA: Diagnosis not present

## 2023-09-29 DIAGNOSIS — M542 Cervicalgia: Secondary | ICD-10-CM | POA: Diagnosis not present

## 2023-09-29 DIAGNOSIS — N1831 Chronic kidney disease, stage 3a: Secondary | ICD-10-CM | POA: Diagnosis not present

## 2023-10-10 DIAGNOSIS — I2510 Atherosclerotic heart disease of native coronary artery without angina pectoris: Secondary | ICD-10-CM | POA: Diagnosis not present

## 2023-10-10 DIAGNOSIS — E785 Hyperlipidemia, unspecified: Secondary | ICD-10-CM | POA: Diagnosis not present

## 2023-10-10 DIAGNOSIS — Z8673 Personal history of transient ischemic attack (TIA), and cerebral infarction without residual deficits: Secondary | ICD-10-CM | POA: Diagnosis not present

## 2023-10-10 DIAGNOSIS — N1831 Chronic kidney disease, stage 3a: Secondary | ICD-10-CM | POA: Diagnosis not present

## 2023-10-10 DIAGNOSIS — I10 Essential (primary) hypertension: Secondary | ICD-10-CM | POA: Diagnosis not present

## 2023-10-10 DIAGNOSIS — E1169 Type 2 diabetes mellitus with other specified complication: Secondary | ICD-10-CM | POA: Diagnosis not present

## 2023-10-17 DIAGNOSIS — E1169 Type 2 diabetes mellitus with other specified complication: Secondary | ICD-10-CM | POA: Diagnosis not present

## 2023-10-17 DIAGNOSIS — Z933 Colostomy status: Secondary | ICD-10-CM | POA: Diagnosis not present

## 2023-10-17 DIAGNOSIS — I2510 Atherosclerotic heart disease of native coronary artery without angina pectoris: Secondary | ICD-10-CM | POA: Diagnosis not present

## 2023-10-17 DIAGNOSIS — E876 Hypokalemia: Secondary | ICD-10-CM | POA: Diagnosis not present

## 2023-10-17 DIAGNOSIS — I10 Essential (primary) hypertension: Secondary | ICD-10-CM | POA: Diagnosis not present

## 2023-10-17 DIAGNOSIS — N1832 Chronic kidney disease, stage 3b: Secondary | ICD-10-CM | POA: Diagnosis not present

## 2023-10-17 DIAGNOSIS — Z0000 Encounter for general adult medical examination without abnormal findings: Secondary | ICD-10-CM | POA: Diagnosis not present

## 2023-10-17 DIAGNOSIS — D638 Anemia in other chronic diseases classified elsewhere: Secondary | ICD-10-CM | POA: Diagnosis not present

## 2023-10-17 DIAGNOSIS — I5020 Unspecified systolic (congestive) heart failure: Secondary | ICD-10-CM | POA: Diagnosis not present

## 2023-10-17 DIAGNOSIS — E785 Hyperlipidemia, unspecified: Secondary | ICD-10-CM | POA: Diagnosis not present

## 2023-10-17 DIAGNOSIS — N1831 Chronic kidney disease, stage 3a: Secondary | ICD-10-CM | POA: Diagnosis not present

## 2023-11-08 DIAGNOSIS — Z934 Other artificial openings of gastrointestinal tract status: Secondary | ICD-10-CM | POA: Diagnosis not present

## 2023-11-08 DIAGNOSIS — Z933 Colostomy status: Secondary | ICD-10-CM | POA: Diagnosis not present

## 2023-11-21 DIAGNOSIS — Z85038 Personal history of other malignant neoplasm of large intestine: Secondary | ICD-10-CM | POA: Diagnosis not present

## 2023-11-21 DIAGNOSIS — N1831 Chronic kidney disease, stage 3a: Secondary | ICD-10-CM | POA: Diagnosis not present

## 2023-11-21 DIAGNOSIS — E785 Hyperlipidemia, unspecified: Secondary | ICD-10-CM | POA: Diagnosis not present

## 2023-11-21 DIAGNOSIS — Z933 Colostomy status: Secondary | ICD-10-CM | POA: Diagnosis not present

## 2023-11-21 DIAGNOSIS — M542 Cervicalgia: Secondary | ICD-10-CM | POA: Diagnosis not present

## 2023-11-21 DIAGNOSIS — E1169 Type 2 diabetes mellitus with other specified complication: Secondary | ICD-10-CM | POA: Diagnosis not present

## 2023-12-02 DIAGNOSIS — Z8673 Personal history of transient ischemic attack (TIA), and cerebral infarction without residual deficits: Secondary | ICD-10-CM | POA: Diagnosis not present

## 2023-12-02 DIAGNOSIS — Z9861 Coronary angioplasty status: Secondary | ICD-10-CM | POA: Diagnosis not present

## 2023-12-02 DIAGNOSIS — I340 Nonrheumatic mitral (valve) insufficiency: Secondary | ICD-10-CM | POA: Diagnosis not present

## 2023-12-02 DIAGNOSIS — G4733 Obstructive sleep apnea (adult) (pediatric): Secondary | ICD-10-CM | POA: Diagnosis not present

## 2023-12-02 DIAGNOSIS — E66811 Obesity, class 1: Secondary | ICD-10-CM | POA: Diagnosis not present

## 2023-12-02 DIAGNOSIS — Z955 Presence of coronary angioplasty implant and graft: Secondary | ICD-10-CM | POA: Diagnosis not present

## 2023-12-02 DIAGNOSIS — I2510 Atherosclerotic heart disease of native coronary artery without angina pectoris: Secondary | ICD-10-CM | POA: Diagnosis not present

## 2023-12-02 DIAGNOSIS — Z9889 Other specified postprocedural states: Secondary | ICD-10-CM | POA: Diagnosis not present

## 2023-12-02 DIAGNOSIS — I5020 Unspecified systolic (congestive) heart failure: Secondary | ICD-10-CM | POA: Diagnosis not present

## 2023-12-02 DIAGNOSIS — N1831 Chronic kidney disease, stage 3a: Secondary | ICD-10-CM | POA: Diagnosis not present

## 2023-12-02 DIAGNOSIS — I10 Essential (primary) hypertension: Secondary | ICD-10-CM | POA: Diagnosis not present

## 2023-12-02 DIAGNOSIS — K219 Gastro-esophageal reflux disease without esophagitis: Secondary | ICD-10-CM | POA: Diagnosis not present

## 2024-01-01 ENCOUNTER — Emergency Department

## 2024-01-01 ENCOUNTER — Emergency Department
Admission: EM | Admit: 2024-01-01 | Discharge: 2024-01-02 | Disposition: A | Discharge status: Home / Self Care | Attending: Emergency Medicine | Admitting: Emergency Medicine

## 2024-01-01 DIAGNOSIS — Z85038 Personal history of other malignant neoplasm of large intestine: Secondary | ICD-10-CM | POA: Diagnosis not present

## 2024-01-01 DIAGNOSIS — N3000 Acute cystitis without hematuria: Secondary | ICD-10-CM

## 2024-01-01 DIAGNOSIS — K625 Hemorrhage of anus and rectum: Secondary | ICD-10-CM | POA: Diagnosis not present

## 2024-01-01 DIAGNOSIS — N939 Abnormal uterine and vaginal bleeding, unspecified: Secondary | ICD-10-CM | POA: Diagnosis not present

## 2024-01-01 DIAGNOSIS — Z933 Colostomy status: Secondary | ICD-10-CM | POA: Insufficient documentation

## 2024-01-01 DIAGNOSIS — R310 Gross hematuria: Secondary | ICD-10-CM | POA: Diagnosis not present

## 2024-01-01 DIAGNOSIS — Z90710 Acquired absence of both cervix and uterus: Secondary | ICD-10-CM | POA: Diagnosis not present

## 2024-01-01 DIAGNOSIS — Z9049 Acquired absence of other specified parts of digestive tract: Secondary | ICD-10-CM | POA: Diagnosis not present

## 2024-01-01 LAB — CBC WITH DIFFERENTIAL/PLATELET
Abs Immature Granulocytes: 0.02 10*3/uL (ref 0.00–0.07)
Basophils Absolute: 0.1 10*3/uL (ref 0.0–0.1)
Basophils Relative: 1 %
Eosinophils Absolute: 0.1 10*3/uL (ref 0.0–0.5)
Eosinophils Relative: 1 %
HCT: 32.4 % — ABNORMAL LOW (ref 36.0–46.0)
Hemoglobin: 10.4 g/dL — ABNORMAL LOW (ref 12.0–15.0)
Immature Granulocytes: 0 %
Lymphocytes Relative: 24 %
Lymphs Abs: 2.3 10*3/uL (ref 0.7–4.0)
MCH: 29.5 pg (ref 26.0–34.0)
MCHC: 32.1 g/dL (ref 30.0–36.0)
MCV: 92 fL (ref 80.0–100.0)
Monocytes Absolute: 0.9 10*3/uL (ref 0.1–1.0)
Monocytes Relative: 9 %
Neutro Abs: 6.2 10*3/uL (ref 1.7–7.7)
Neutrophils Relative %: 65 %
Platelets: 427 10*3/uL — ABNORMAL HIGH (ref 150–400)
RBC: 3.52 MIL/uL — ABNORMAL LOW (ref 3.87–5.11)
RDW: 15.7 % — ABNORMAL HIGH (ref 11.5–15.5)
WBC: 9.5 10*3/uL (ref 4.0–10.5)
nRBC: 0 % (ref 0.0–0.2)

## 2024-01-01 LAB — BASIC METABOLIC PANEL
Anion gap: 11 (ref 5–15)
BUN: 21 mg/dL (ref 8–23)
CO2: 19 mmol/L — ABNORMAL LOW (ref 22–32)
Calcium: 9.2 mg/dL (ref 8.9–10.3)
Chloride: 109 mmol/L (ref 98–111)
Creatinine, Ser: 1.12 mg/dL — ABNORMAL HIGH (ref 0.44–1.00)
GFR, Estimated: 51 mL/min — ABNORMAL LOW (ref >60)
Glucose, Bld: 122 mg/dL — ABNORMAL HIGH (ref 70–99)
Potassium: 3.4 mmol/L — ABNORMAL LOW (ref 3.5–5.1)
Sodium: 139 mmol/L (ref 135–145)

## 2024-01-01 LAB — URINALYSIS, ROUTINE W REFLEX MICROSCOPIC
RBC / HPF: >50 RBC/hpf (ref 0–5)
Squamous Epithelial / HPF: 0 /HPF (ref 0–5)
WBC, UA: >50 WBC/hpf (ref 0–5)

## 2024-01-01 MED ORDER — IOHEXOL 300 MG/ML  SOLN
100.0000 mL | Freq: Once | INTRAMUSCULAR | Status: AC | PRN
  Administered 2024-01-01: 100 mL via INTRAVENOUS

## 2024-01-01 NOTE — ED Notes (Signed)
 Pt reports hx of htn. Has taken BP medications as prescribed. Pt takes BP meds twice daily.

## 2024-01-01 NOTE — ED Provider Notes (Signed)
-----------------------------------------   11:43 PM on 01/01/2024 -----------------------------------------  Blood pressure (!) 180/113, pulse 95, temperature 98.6 F (37 C), temperature source Oral, resp. rate 18, height 4\' 11"  (1.499 m), weight 68.5 kg, SpO2 95%.  Assuming care from Dr. Anner Crete.  In short, Kendra Ritter is a 78 y.o. female with a chief complaint of Vaginal Bleeding .  Refer to the original H&P for additional details.  The current plan of care is to follow-up CT abdomen/pelvis for lower GI bleeding.  ----------------------------------------- 12:52 AM on 01/02/2024 ----------------------------------------- CT imaging is unremarkable, urinalysis does show blood and possible infection, will send for culture and treat with Keflex.  Patient appropriate for outpatient management, will refer to GI for follow-up of rectal bleeding and urology for hematuria.  She was counseled to return to the ED for new or worsening symptoms, patient agrees with plan.       Chesley Noon, MD 01/02/24 [phone]

## 2024-01-01 NOTE — ED Provider Notes (Signed)
 [facility] Provider Note    Event Date/Time   First MD Initiated Contact with Patient 01/01/24 2124     (approximate)   History   Vaginal Bleeding   HPI Kendra Ritter is a 78 y.o. female presenting today for vaginal bleeding.  Patient states for the past 3 weeks she has had intermittent vaginal bleeding.  She notes that it happens both when she urinates and when she bears down.  Is unsure if she is having any rectal bleeding but does have history of colostomy and has not noticed any bright red blood in the area or black stools in her colostomy bag.  Denies any abdominal pain.  Notes generalized weakness.     Physical Exam   Triage Vital Signs: ED Triage Vitals  Encounter Vitals Group     BP 01/01/24 1946 (!) 180/113     Systolic BP Percentile --      Diastolic BP Percentile --      Pulse Rate 01/01/24 1944 95     Resp 01/01/24 1944 18     Temp 01/01/24 1944 98.6 F (37 C)     Temp Source 01/01/24 1944 Oral     SpO2 01/01/24 1944 95 %     Weight 01/01/24 1943 151 lb (68.5 kg)     Height 01/01/24 1943 4\' 11"  (1.499 m)     Head Circumference --      Peak Flow --      Pain Score 01/01/24 1942 0     Pain Loc --      Pain Education --      Exclude from Growth Chart --     Most recent vital signs: Vitals:   01/01/24 1944 01/01/24 1946  BP:  (!) 180/113  Pulse: 95   Resp: 18   Temp: 98.6 F (37 C)   SpO2: 95%    Physical Exam: I have reviewed the vital signs and nursing notes. General: Awake, alert, no acute distress.  Nontoxic appearing. Head:  Atraumatic, normocephalic.   ENT:  EOM intact, PERRL. Oral mucosa is pink and moist with no lesions. Neck: Neck is supple with full range of motion, No meningeal signs. Cardiovascular:  RRR, No murmurs. Peripheral pulses palpable and equal bilaterally. Respiratory:  Symmetrical chest wall expansion.  No rhonchi, rales, or wheezes.  Good air movement throughout.  No use of accessory muscles.    Musculoskeletal:  No cyanosis or edema. Moving extremities with full ROM Abdomen:  Soft, nontender, nondistended. Neuro:  GCS 15, moving all four extremities, interacting appropriately. Speech clear. Psych:  Calm, appropriate.   Skin:  Warm, dry, no rash.    ED Results / Procedures / Treatments   Labs (all labs ordered are listed, but only abnormal results are displayed) Labs Reviewed  CBC WITH DIFFERENTIAL/PLATELET - Abnormal; Notable for the following components:      Result Value   RBC 3.52 (*)    Hemoglobin 10.4 (*)    HCT 32.4 (*)    RDW 15.7 (*)    Platelets 427 (*)    All other components within normal limits  BASIC METABOLIC PANEL - Abnormal; Notable for the following components:   Potassium 3.4 (*)    CO2 19 (*)    Glucose, Bld 122 (*)    Creatinine, Ser 1.12 (*)    GFR, Estimated 51 (*)    All other components within normal limits  URINALYSIS, ROUTINE W REFLEX MICROSCOPIC     EKG  RADIOLOGY Independently interpreted ultrasound with evidence of hysterectomy and no other obvious signs of for vaginal bleeding may be originating from   PROCEDURES:  Critical Care performed: No  Procedures   MEDICATIONS ORDERED IN ED: Medications - No data to display   IMPRESSION / MDM / ASSESSMENT AND PLAN / ED COURSE  I reviewed the triage vital signs and the nursing notes.                              Differential diagnosis includes, but is not limited to, fibroid, adenoma,  Patient's presentation is most consistent with acute complicated illness / injury requiring diagnostic workup.  Patient is a 78 year old female presenting today over concerns of vaginal bleeding ongoing for 3 weeks.  She denies any known history with her uterus or ovaries or any surgeries that she is aware of.  No prior episodes similar to this.  Is unsure if she is have any rectal bleeding but does have a colostomy bag and has not noticed any bright red blood in there or melanotic stools in  her colostomy bag.  Hemoglobin relatively stable with her baseline.  Transvaginal ultrasound ordered and indicates patient had prior hysterectomy with no obvious source of her bleed.  Patient did not remember having the surgery, but states she did not remember all of her abdominal surgeries in the past.  Bedside exam on rectal shows blood appearing to be coming from the rectum and not the vagina.  Given history of colon cancer, will get CT for further evaluation.  Bleeding is very scant at this time and not heavy.  Patient signed out to oncoming provider pending results of CT  The patient is on the cardiac monitor to evaluate for evidence of arrhythmia and/or significant heart rate changes.     FINAL CLINICAL IMPRESSION(S) / ED DIAGNOSES   Final diagnoses:  Rectal bleeding     Rx / DC Orders   ED Discharge Orders     None        Note:  This document was prepared using Dragon voice recognition software and may include unintentional dictation errors.   Janith Lima, MD 01/01/24 2329

## 2024-01-01 NOTE — ED Triage Notes (Signed)
 Pt present to the ED via POV from home. Pt reports vaginal bleeding x2 weeks. Pt states that she was not able to get a appointment at her PCP for this until mid April.

## 2024-01-02 ENCOUNTER — Other Ambulatory Visit: Payer: Self-pay

## 2024-01-02 DIAGNOSIS — Z9049 Acquired absence of other specified parts of digestive tract: Secondary | ICD-10-CM | POA: Diagnosis not present

## 2024-01-02 MED ORDER — CEPHALEXIN 500 MG PO CAPS: 500.0000 mg | ORAL_CAPSULE | Freq: Four times a day (QID) | ORAL | 0 refills | Status: AC

## 2024-01-02 MED ORDER — CEPHALEXIN 500 MG PO CAPS
500.0000 mg | ORAL_CAPSULE | Freq: Once | ORAL | Status: AC
  Administered 2024-01-02: 500 mg via ORAL
  Filled 2024-01-02: qty 1

## 2024-01-03 LAB — URINE CULTURE: Culture: 50000 — AB

## 2024-01-10 DIAGNOSIS — N1831 Chronic kidney disease, stage 3a: Secondary | ICD-10-CM | POA: Diagnosis not present

## 2024-01-10 DIAGNOSIS — E1169 Type 2 diabetes mellitus with other specified complication: Secondary | ICD-10-CM | POA: Diagnosis not present

## 2024-01-10 DIAGNOSIS — I509 Heart failure, unspecified: Secondary | ICD-10-CM | POA: Diagnosis not present

## 2024-01-10 DIAGNOSIS — C189 Malignant neoplasm of colon, unspecified: Secondary | ICD-10-CM | POA: Diagnosis not present

## 2024-01-10 DIAGNOSIS — E785 Hyperlipidemia, unspecified: Secondary | ICD-10-CM | POA: Diagnosis not present

## 2024-01-10 DIAGNOSIS — R809 Proteinuria, unspecified: Secondary | ICD-10-CM | POA: Diagnosis not present

## 2024-01-10 DIAGNOSIS — R8290 Unspecified abnormal findings in urine: Secondary | ICD-10-CM | POA: Diagnosis not present

## 2024-01-10 DIAGNOSIS — D638 Anemia in other chronic diseases classified elsewhere: Secondary | ICD-10-CM | POA: Diagnosis not present

## 2024-01-10 DIAGNOSIS — E876 Hypokalemia: Secondary | ICD-10-CM | POA: Diagnosis not present

## 2024-01-10 DIAGNOSIS — R7303 Prediabetes: Secondary | ICD-10-CM | POA: Diagnosis not present

## 2024-01-10 DIAGNOSIS — I2510 Atherosclerotic heart disease of native coronary artery without angina pectoris: Secondary | ICD-10-CM | POA: Diagnosis not present

## 2024-01-10 DIAGNOSIS — G4733 Obstructive sleep apnea (adult) (pediatric): Secondary | ICD-10-CM | POA: Diagnosis not present

## 2024-01-10 DIAGNOSIS — I10 Essential (primary) hypertension: Secondary | ICD-10-CM | POA: Diagnosis not present

## 2024-01-10 DIAGNOSIS — C539 Malignant neoplasm of cervix uteri, unspecified: Secondary | ICD-10-CM | POA: Diagnosis not present

## 2024-01-10 DIAGNOSIS — Z933 Colostomy status: Secondary | ICD-10-CM | POA: Diagnosis not present

## 2024-01-12 DIAGNOSIS — I10 Essential (primary) hypertension: Secondary | ICD-10-CM | POA: Diagnosis not present

## 2024-01-12 DIAGNOSIS — D638 Anemia in other chronic diseases classified elsewhere: Secondary | ICD-10-CM | POA: Diagnosis not present

## 2024-01-12 DIAGNOSIS — E1169 Type 2 diabetes mellitus with other specified complication: Secondary | ICD-10-CM | POA: Diagnosis not present

## 2024-01-12 DIAGNOSIS — Z9049 Acquired absence of other specified parts of digestive tract: Secondary | ICD-10-CM | POA: Diagnosis not present

## 2024-01-12 DIAGNOSIS — K219 Gastro-esophageal reflux disease without esophagitis: Secondary | ICD-10-CM | POA: Diagnosis not present

## 2024-01-12 DIAGNOSIS — Z933 Colostomy status: Secondary | ICD-10-CM | POA: Diagnosis not present

## 2024-01-12 DIAGNOSIS — E785 Hyperlipidemia, unspecified: Secondary | ICD-10-CM | POA: Diagnosis not present

## 2024-01-12 DIAGNOSIS — Z85038 Personal history of other malignant neoplasm of large intestine: Secondary | ICD-10-CM | POA: Diagnosis not present

## 2024-01-12 DIAGNOSIS — R197 Diarrhea, unspecified: Secondary | ICD-10-CM | POA: Diagnosis not present

## 2024-01-12 DIAGNOSIS — K625 Hemorrhage of anus and rectum: Secondary | ICD-10-CM | POA: Diagnosis not present

## 2024-01-12 DIAGNOSIS — I2510 Atherosclerotic heart disease of native coronary artery without angina pectoris: Secondary | ICD-10-CM | POA: Diagnosis not present

## 2024-01-17 DIAGNOSIS — D638 Anemia in other chronic diseases classified elsewhere: Secondary | ICD-10-CM | POA: Diagnosis not present

## 2024-01-17 DIAGNOSIS — E1169 Type 2 diabetes mellitus with other specified complication: Secondary | ICD-10-CM | POA: Diagnosis not present

## 2024-01-17 DIAGNOSIS — N1832 Chronic kidney disease, stage 3b: Secondary | ICD-10-CM | POA: Diagnosis not present

## 2024-01-17 DIAGNOSIS — I10 Essential (primary) hypertension: Secondary | ICD-10-CM | POA: Diagnosis not present

## 2024-01-17 DIAGNOSIS — E785 Hyperlipidemia, unspecified: Secondary | ICD-10-CM | POA: Diagnosis not present

## 2024-01-26 ENCOUNTER — Encounter: Payer: Self-pay | Admitting: Urology

## 2024-01-26 ENCOUNTER — Ambulatory Visit (INDEPENDENT_AMBULATORY_CARE_PROVIDER_SITE_OTHER): Admitting: Urology

## 2024-01-26 VITALS — BP 135/78 | HR 68 | Ht 59.0 in | Wt 148.0 lb

## 2024-01-26 DIAGNOSIS — R8271 Bacteriuria: Secondary | ICD-10-CM | POA: Diagnosis not present

## 2024-01-26 DIAGNOSIS — R310 Gross hematuria: Secondary | ICD-10-CM

## 2024-01-26 LAB — URINALYSIS, COMPLETE
Bilirubin, UA: NEGATIVE
Glucose, UA: NEGATIVE
Ketones, UA: NEGATIVE
Nitrite, UA: NEGATIVE
RBC, UA: NEGATIVE
Specific Gravity, UA: 1.02 (ref 1.005–1.030)
Urobilinogen, Ur: 0.2 mg/dL (ref 0.2–1.0)
pH, UA: 5.5 (ref 5.0–7.5)

## 2024-01-26 LAB — MICROSCOPIC EXAMINATION: Epithelial Cells (non renal): >10 /HPF — AB (ref 0–10)

## 2024-01-26 NOTE — Progress Notes (Signed)
 I, Kendra Ritter, acting as a scribe for Kendra Knapp, MD., have documented all relevant documentation on the behalf of Kendra Knapp, MD, as directed by Kendra Knapp, MD while in the presence of Kendra Knapp, MD.  01/26/2024 10:35 PM   Kendra Ritter [birth year] [id number]  Referring provider: Monique Ano, MD [phone] [facility] [address] [facility] [place],  [place] [address]  Chief Complaint  Patient presents with   Hematuria    HPI: Kendra Ritter is a 78 y.o. female referred for evaluation of hematuria.  Presented to [facility] ED 01/01/24 complaining of vaginal bleeding. Was having intermittent vaginal bleeding for 3 weeks worse when bearing down. She had mild urinary hesitancy. On exam, she had no blood per vagina but was noted to have rectal bleeding. A catheterized urine was obtained, which was clear grossly, but had >50 WBC/RBC. Urine cultures ordered, which was positive for erythroclcus. She had no UTI symptoms at her ED visit. She did have a CT abdomen/pelvis with contrast, which showed no upper tract abnormalities. Her only urinary complaint is she does not feel she urinates as much Denies dysuria, gross hematuria Denies flank, abdominal, or pelvic pain.    PMH: Past Medical History:  Diagnosis Date   Anemia    BPPV (benign paroxysmal positional vertigo)    Cervical ca (HCC)    CHF (congestive heart failure) (HCC)    Chronic kidney disease    Colon cancer (HCC) 1998   GERD (gastroesophageal reflux disease)    H/O aortic valve replacement    Heart murmur    Hx of cervical cancer    Hypertension    Sleep apnea    Stroke Kendra Ritter)    multiple strokes, 2010, 2011, 2013     Surgical History: Past Surgical History:  Procedure Laterality Date   AORTIC VALVE REPLACEMENT  2008   [facility]   APPENDECTOMY     CARDIAC CATHETERIZATION  2008   [facility]   CHOLECYSTECTOMY     COLON SURGERY     colostoma bag   COLONOSCOPY WITH ESOPHAGOGASTRODUODENOSCOPY (EGD)      COLOSTOMY  1998   After colon cancer    DIAGNOSTIC LAPAROSCOPY     ESOPHAGOGASTRODUODENOSCOPY N/A 04/21/2022   Procedure: ESOPHAGOGASTRODUODENOSCOPY (EGD);  Surgeon: Toledo, Alphonsus Jeans, MD;  Location: [facility] ENDOSCOPY;  Service: Gastroenterology;  Laterality: N/A;   ESOPHAGOGASTRODUODENOSCOPY (EGD) WITH PROPOFOL  N/A 06/29/2019   Procedure: ESOPHAGOGASTRODUODENOSCOPY (EGD) WITH PROPOFOL ;  Surgeon: Selena Daily, MD;  Location: [facility] ENDOSCOPY;  Service: Gastroenterology;  Laterality: N/A;   ESOPHAGOGASTRODUODENOSCOPY (EGD) WITH PROPOFOL  N/A 08/03/2019   Procedure: ESOPHAGOGASTRODUODENOSCOPY (EGD) WITH PROPOFOL ;  Surgeon: Selena Daily, MD;  Location: [facility] ENDOSCOPY;  Service: Gastroenterology;  Laterality: N/A;   LEFT HEART CATH AND CORONARY ANGIOGRAPHY N/A 03/10/2022   Procedure: LEFT HEART CATH AND CORONARY ANGIOGRAPHY poss Intervention;  Surgeon: Link Rice, MD;  Location: [facility] INVASIVE CV LAB;  Service: Cardiovascular;  Laterality: N/A;   partial hysterectomy     cervical cancer    Home Medications:  Allergies as of 01/26/2024       Reactions   Lisinopril Anaphylaxis, Cough   Other reaction(s): Other (See Comments) Other Reaction: bad cough   Losartan  Cough        Medication List        Accurate as of January 26, 2024 10:35 PM. If you have any questions, ask your nurse or doctor.          STOP taking  these medications    amLODipine  10 MG tablet Commonly known as: NORVASC  Stopped by: Kendra Ritter   diphenoxylate -atropine  2.5-0.025 MG tablet Commonly known as: LOMOTIL  Stopped by: Kendra Ritter   ferrous sulfate  325 (65 FE) MG tablet Stopped by: Kendra Ritter   prednisoLONE acetate 1 % ophthalmic suspension Commonly known as: PRED FORTE Stopped by: Kendra Ritter       TAKE these medications    aspirin  EC 81 MG tablet Take 1 tablet (81 mg total) by mouth daily.   B-12 2500 MCG Subl Place 2,500 mcg under the tongue daily.    carvedilol  25 MG tablet Commonly known as: COREG  Take 25 mg by mouth 2 (two) times daily with a meal.   cloNIDine  0.1 MG tablet Commonly known as: CATAPRES  Take 0.1 mg by mouth daily.   Fish Oil 1000 MG Caps Take 1,000 mg by mouth daily.   gabapentin  300 MG capsule Commonly known as: NEURONTIN  Take 300 mg by mouth 3 (three) times daily.   hydrALAZINE 25 MG tablet Commonly known as: APRESOLINE Take 25 mg by mouth 3 (three) times daily.   HYDROcodone -acetaminophen  5-325 MG tablet Commonly known as: NORCO/VICODIN Take 1 tablet every 6-8 hours prn pain   isosorbide mononitrate 30 MG 24 hr tablet Commonly known as: IMDUR Take 30 mg by mouth daily.   latanoprost 0.005 % ophthalmic solution Commonly known as: XALATAN Place 1 drop into both eyes at bedtime.   losartan  100 MG tablet Commonly known as: COZAAR    omeprazole 40 MG capsule Commonly known as: PRILOSEC   potassium chloride  SA 20 MEQ tablet Commonly known as: KLOR-CON  M Take 1 tablet (20 mEq total) by mouth daily for 3 days.   pravastatin 20 MG tablet Commonly known as: PRAVACHOL Take by mouth.   spironolactone 25 MG tablet Commonly known as: ALDACTONE   triamcinolone  ointment 0.1 % Commonly known as: KENALOG        Allergies:  Allergies  Allergen Reactions   Lisinopril Anaphylaxis and Cough    Other reaction(s): Other (See Comments) Other Reaction: bad cough   Losartan  Cough    Family History: Family History  Problem Relation Age of Onset   Heart disease Mother    Heart attack Mother    Hypertension Mother    Breast cancer Maternal Aunt     Social History:  reports that she has never smoked. She has never used smokeless tobacco. She reports that she does not drink alcohol and does not use drugs.   Physical Exam: BP 135/78   Pulse 68   Ht 4\' 11"  (1.499 m)   Wt 148 lb (67.1 kg)   BMI 29.89 kg/m   Constitutional:  Alert and oriented, No acute distress. HEENT: [place] AT Respiratory:  Normal respiratory effort, no increased work of breathing. Psychiatric: Normal mood and affect.   Urinalysis Dipstick 1+ protein/trace leukocytes, microscopy 11-30 WBC/>10 epis    Assessment & Plan:    1. Bacteriuria Urinalysis in the ED consistent with  asymptomatic bacteriuria UA today with 11-30 WBCs, however there are >10 epis consistent with vaginal contamination.  PVR today 0 mL  I have reviewed the above documentation for accuracy and completeness, and I agree with the above.   Kendra Knapp, MD  [facility] [address] [place], [place] [address] [phone]

## 2024-01-30 DIAGNOSIS — Z934 Other artificial openings of gastrointestinal tract status: Secondary | ICD-10-CM | POA: Diagnosis not present

## 2024-01-30 DIAGNOSIS — Z933 Colostomy status: Secondary | ICD-10-CM | POA: Diagnosis not present

## 2024-03-19 DIAGNOSIS — H40003 Preglaucoma, unspecified, bilateral: Secondary | ICD-10-CM | POA: Diagnosis not present

## 2024-03-26 DIAGNOSIS — Z961 Presence of intraocular lens: Secondary | ICD-10-CM | POA: Diagnosis not present

## 2024-03-26 DIAGNOSIS — H179 Unspecified corneal scar and opacity: Secondary | ICD-10-CM | POA: Diagnosis not present

## 2024-03-26 DIAGNOSIS — H40003 Preglaucoma, unspecified, bilateral: Secondary | ICD-10-CM | POA: Diagnosis not present

## 2024-03-29 DIAGNOSIS — N1831 Chronic kidney disease, stage 3a: Secondary | ICD-10-CM | POA: Diagnosis not present

## 2024-03-29 DIAGNOSIS — G4733 Obstructive sleep apnea (adult) (pediatric): Secondary | ICD-10-CM | POA: Diagnosis not present

## 2024-03-29 DIAGNOSIS — C189 Malignant neoplasm of colon, unspecified: Secondary | ICD-10-CM | POA: Diagnosis not present

## 2024-03-29 DIAGNOSIS — R7303 Prediabetes: Secondary | ICD-10-CM | POA: Diagnosis not present

## 2024-03-29 DIAGNOSIS — Z933 Colostomy status: Secondary | ICD-10-CM | POA: Diagnosis not present

## 2024-03-29 DIAGNOSIS — E876 Hypokalemia: Secondary | ICD-10-CM | POA: Diagnosis not present

## 2024-03-29 DIAGNOSIS — C539 Malignant neoplasm of cervix uteri, unspecified: Secondary | ICD-10-CM | POA: Diagnosis not present

## 2024-03-29 DIAGNOSIS — I10 Essential (primary) hypertension: Secondary | ICD-10-CM | POA: Diagnosis not present

## 2024-03-29 DIAGNOSIS — R809 Proteinuria, unspecified: Secondary | ICD-10-CM | POA: Diagnosis not present

## 2024-03-29 DIAGNOSIS — I509 Heart failure, unspecified: Secondary | ICD-10-CM | POA: Diagnosis not present

## 2024-03-29 DIAGNOSIS — E785 Hyperlipidemia, unspecified: Secondary | ICD-10-CM | POA: Diagnosis not present

## 2024-03-29 DIAGNOSIS — R8290 Unspecified abnormal findings in urine: Secondary | ICD-10-CM | POA: Diagnosis not present

## 2024-04-10 DIAGNOSIS — G4733 Obstructive sleep apnea (adult) (pediatric): Secondary | ICD-10-CM | POA: Diagnosis not present

## 2024-04-10 DIAGNOSIS — K219 Gastro-esophageal reflux disease without esophagitis: Secondary | ICD-10-CM | POA: Diagnosis not present

## 2024-04-10 DIAGNOSIS — Z933 Colostomy status: Secondary | ICD-10-CM | POA: Diagnosis not present

## 2024-04-10 DIAGNOSIS — I509 Heart failure, unspecified: Secondary | ICD-10-CM | POA: Diagnosis not present

## 2024-04-10 DIAGNOSIS — R7303 Prediabetes: Secondary | ICD-10-CM | POA: Diagnosis not present

## 2024-04-10 DIAGNOSIS — I10 Essential (primary) hypertension: Secondary | ICD-10-CM | POA: Diagnosis not present

## 2024-04-10 DIAGNOSIS — C539 Malignant neoplasm of cervix uteri, unspecified: Secondary | ICD-10-CM | POA: Diagnosis not present

## 2024-04-10 DIAGNOSIS — R809 Proteinuria, unspecified: Secondary | ICD-10-CM | POA: Diagnosis not present

## 2024-04-10 DIAGNOSIS — E876 Hypokalemia: Secondary | ICD-10-CM | POA: Diagnosis not present

## 2024-04-10 DIAGNOSIS — N1831 Chronic kidney disease, stage 3a: Secondary | ICD-10-CM | POA: Diagnosis not present

## 2024-04-10 DIAGNOSIS — C189 Malignant neoplasm of colon, unspecified: Secondary | ICD-10-CM | POA: Diagnosis not present

## 2024-04-10 DIAGNOSIS — E785 Hyperlipidemia, unspecified: Secondary | ICD-10-CM | POA: Diagnosis not present

## 2024-04-11 ENCOUNTER — Other Ambulatory Visit: Payer: Self-pay | Admitting: Nephrology

## 2024-04-11 DIAGNOSIS — C189 Malignant neoplasm of colon, unspecified: Secondary | ICD-10-CM

## 2024-04-11 DIAGNOSIS — Z933 Colostomy status: Secondary | ICD-10-CM

## 2024-04-11 DIAGNOSIS — C539 Malignant neoplasm of cervix uteri, unspecified: Secondary | ICD-10-CM

## 2024-04-11 DIAGNOSIS — R809 Proteinuria, unspecified: Secondary | ICD-10-CM

## 2024-04-11 DIAGNOSIS — I10 Essential (primary) hypertension: Secondary | ICD-10-CM

## 2024-04-11 DIAGNOSIS — I509 Heart failure, unspecified: Secondary | ICD-10-CM

## 2024-04-11 DIAGNOSIS — N1831 Chronic kidney disease, stage 3a: Secondary | ICD-10-CM

## 2024-04-11 DIAGNOSIS — R7303 Prediabetes: Secondary | ICD-10-CM

## 2024-04-11 DIAGNOSIS — E876 Hypokalemia: Secondary | ICD-10-CM

## 2024-04-11 DIAGNOSIS — E785 Hyperlipidemia, unspecified: Secondary | ICD-10-CM

## 2024-04-18 DIAGNOSIS — Z933 Colostomy status: Secondary | ICD-10-CM | POA: Diagnosis not present

## 2024-04-18 DIAGNOSIS — Z934 Other artificial openings of gastrointestinal tract status: Secondary | ICD-10-CM | POA: Diagnosis not present

## 2024-04-19 DIAGNOSIS — E785 Hyperlipidemia, unspecified: Secondary | ICD-10-CM | POA: Diagnosis not present

## 2024-04-19 DIAGNOSIS — D638 Anemia in other chronic diseases classified elsewhere: Secondary | ICD-10-CM | POA: Diagnosis not present

## 2024-04-19 DIAGNOSIS — N1832 Chronic kidney disease, stage 3b: Secondary | ICD-10-CM | POA: Diagnosis not present

## 2024-04-19 DIAGNOSIS — N1831 Chronic kidney disease, stage 3a: Secondary | ICD-10-CM | POA: Diagnosis not present

## 2024-04-19 DIAGNOSIS — E1169 Type 2 diabetes mellitus with other specified complication: Secondary | ICD-10-CM | POA: Diagnosis not present

## 2024-04-25 DIAGNOSIS — I10 Essential (primary) hypertension: Secondary | ICD-10-CM | POA: Diagnosis not present

## 2024-04-25 DIAGNOSIS — E785 Hyperlipidemia, unspecified: Secondary | ICD-10-CM | POA: Diagnosis not present

## 2024-04-25 DIAGNOSIS — E1169 Type 2 diabetes mellitus with other specified complication: Secondary | ICD-10-CM | POA: Diagnosis not present

## 2024-04-25 DIAGNOSIS — I2510 Atherosclerotic heart disease of native coronary artery without angina pectoris: Secondary | ICD-10-CM | POA: Diagnosis not present

## 2024-04-25 DIAGNOSIS — R197 Diarrhea, unspecified: Secondary | ICD-10-CM | POA: Diagnosis not present

## 2024-04-25 DIAGNOSIS — N1831 Chronic kidney disease, stage 3a: Secondary | ICD-10-CM | POA: Diagnosis not present

## 2024-04-25 DIAGNOSIS — D638 Anemia in other chronic diseases classified elsewhere: Secondary | ICD-10-CM | POA: Diagnosis not present

## 2024-05-31 DIAGNOSIS — E782 Mixed hyperlipidemia: Secondary | ICD-10-CM | POA: Diagnosis not present

## 2024-05-31 DIAGNOSIS — I2510 Atherosclerotic heart disease of native coronary artery without angina pectoris: Secondary | ICD-10-CM | POA: Diagnosis not present

## 2024-05-31 DIAGNOSIS — I340 Nonrheumatic mitral (valve) insufficiency: Secondary | ICD-10-CM | POA: Diagnosis not present

## 2024-05-31 DIAGNOSIS — I10 Essential (primary) hypertension: Secondary | ICD-10-CM | POA: Diagnosis not present

## 2024-05-31 DIAGNOSIS — E66811 Obesity, class 1: Secondary | ICD-10-CM | POA: Diagnosis not present

## 2024-05-31 DIAGNOSIS — G4733 Obstructive sleep apnea (adult) (pediatric): Secondary | ICD-10-CM | POA: Diagnosis not present

## 2024-05-31 DIAGNOSIS — Z9889 Other specified postprocedural states: Secondary | ICD-10-CM | POA: Diagnosis not present

## 2024-05-31 DIAGNOSIS — Z955 Presence of coronary angioplasty implant and graft: Secondary | ICD-10-CM | POA: Diagnosis not present

## 2024-05-31 DIAGNOSIS — Z8673 Personal history of transient ischemic attack (TIA), and cerebral infarction without residual deficits: Secondary | ICD-10-CM | POA: Diagnosis not present

## 2024-05-31 DIAGNOSIS — N1831 Chronic kidney disease, stage 3a: Secondary | ICD-10-CM | POA: Diagnosis not present

## 2024-05-31 DIAGNOSIS — E1169 Type 2 diabetes mellitus with other specified complication: Secondary | ICD-10-CM | POA: Diagnosis not present

## 2024-05-31 DIAGNOSIS — I5020 Unspecified systolic (congestive) heart failure: Secondary | ICD-10-CM | POA: Diagnosis not present

## 2024-06-21 DIAGNOSIS — Z9049 Acquired absence of other specified parts of digestive tract: Secondary | ICD-10-CM | POA: Diagnosis not present

## 2024-06-21 DIAGNOSIS — E785 Hyperlipidemia, unspecified: Secondary | ICD-10-CM | POA: Diagnosis not present

## 2024-06-21 DIAGNOSIS — R197 Diarrhea, unspecified: Secondary | ICD-10-CM | POA: Diagnosis not present

## 2024-06-21 DIAGNOSIS — Z85038 Personal history of other malignant neoplasm of large intestine: Secondary | ICD-10-CM | POA: Diagnosis not present

## 2024-06-21 DIAGNOSIS — E1169 Type 2 diabetes mellitus with other specified complication: Secondary | ICD-10-CM | POA: Diagnosis not present

## 2024-06-21 DIAGNOSIS — D638 Anemia in other chronic diseases classified elsewhere: Secondary | ICD-10-CM | POA: Diagnosis not present

## 2024-06-21 DIAGNOSIS — K219 Gastro-esophageal reflux disease without esophagitis: Secondary | ICD-10-CM | POA: Diagnosis not present

## 2024-06-21 DIAGNOSIS — Z66 Do not resuscitate: Secondary | ICD-10-CM | POA: Diagnosis not present

## 2024-07-23 DIAGNOSIS — R42 Dizziness and giddiness: Secondary | ICD-10-CM | POA: Diagnosis not present

## 2024-07-23 DIAGNOSIS — R252 Cramp and spasm: Secondary | ICD-10-CM | POA: Diagnosis not present

## 2024-09-10 ENCOUNTER — Emergency Department
Admission: EM | Admit: 2024-09-10 | Discharge: 2024-09-10 | Disposition: A | Discharge status: Home / Self Care | Attending: Emergency Medicine | Admitting: Emergency Medicine

## 2024-09-10 ENCOUNTER — Emergency Department

## 2024-09-10 DIAGNOSIS — I110 Hypertensive heart disease with heart failure: Secondary | ICD-10-CM | POA: Insufficient documentation

## 2024-09-10 DIAGNOSIS — Z8673 Personal history of transient ischemic attack (TIA), and cerebral infarction without residual deficits: Secondary | ICD-10-CM | POA: Insufficient documentation

## 2024-09-10 DIAGNOSIS — R600 Localized edema: Secondary | ICD-10-CM | POA: Diagnosis present

## 2024-09-10 DIAGNOSIS — I509 Heart failure, unspecified: Secondary | ICD-10-CM | POA: Insufficient documentation

## 2024-09-10 LAB — CBC
HCT: 31.3 % — ABNORMAL LOW (ref 36.0–46.0)
Hemoglobin: 9.8 g/dL — ABNORMAL LOW (ref 12.0–15.0)
MCH: 29 pg (ref 26.0–34.0)
MCHC: 31.3 g/dL (ref 30.0–36.0)
MCV: 92.6 fL (ref 80.0–100.0)
Platelets: 460 K/uL — ABNORMAL HIGH (ref 150–400)
RBC: 3.38 MIL/uL — ABNORMAL LOW (ref 3.87–5.11)
RDW: 16.9 % — ABNORMAL HIGH (ref 11.5–15.5)
WBC: 14.4 K/uL — ABNORMAL HIGH (ref 4.0–10.5)
nRBC: 0 % (ref 0.0–0.2)

## 2024-09-10 LAB — BASIC METABOLIC PANEL WITH GFR
Anion gap: 12 (ref 5–15)
BUN: 11 mg/dL (ref 8–23)
CO2: 25 mmol/L (ref 22–32)
Calcium: 9.5 mg/dL (ref 8.9–10.3)
Chloride: 103 mmol/L (ref 98–111)
Creatinine, Ser: 1.01 mg/dL — ABNORMAL HIGH (ref 0.44–1.00)
GFR, Estimated: 57 mL/min — ABNORMAL LOW (ref >60)
Glucose, Bld: 113 mg/dL — ABNORMAL HIGH (ref 70–99)
Potassium: 3.1 mmol/L — ABNORMAL LOW (ref 3.5–5.1)
Sodium: 140 mmol/L (ref 135–145)

## 2024-09-10 LAB — TROPONIN T, HIGH SENSITIVITY: Troponin T High Sensitivity: 28 ng/L — ABNORMAL HIGH (ref 0–19)

## 2024-09-10 LAB — PRO BRAIN NATRIURETIC PEPTIDE: Pro Brain Natriuretic Peptide: 945 pg/mL — ABNORMAL HIGH (ref <300.0)

## 2024-09-10 MED ORDER — HYDROCODONE-ACETAMINOPHEN 5-325 MG PO TABS: 1.0000 | ORAL_TABLET | ORAL | 0 refills | Status: AC | PRN

## 2024-09-10 MED ORDER — FUROSEMIDE 20 MG PO TABS: 20.0000 mg | ORAL_TABLET | Freq: Every day | ORAL | 11 refills | Status: DC

## 2024-09-10 MED ORDER — HYDROCODONE-ACETAMINOPHEN 5-325 MG PO TABS
1.0000 | ORAL_TABLET | Freq: Once | ORAL | Status: AC
  Administered 2024-09-10: 1 via ORAL
  Filled 2024-09-10: qty 1

## 2024-09-10 MED ORDER — CEPHALEXIN 500 MG PO CAPS: 500.0000 mg | ORAL_CAPSULE | Freq: Two times a day (BID) | ORAL | 0 refills | Status: AC

## 2024-09-10 MED ORDER — FUROSEMIDE 20 MG PO TABS: 20.0000 mg | ORAL_TABLET | Freq: Every day | ORAL | 11 refills | Status: AC

## 2024-09-10 MED ORDER — MEDICAL COMPRESSION STOCKINGS MISC: 0 refills | Status: AC

## 2024-09-10 NOTE — ED Notes (Signed)
 Significant swelling to L ankle. Pt reports L leg pain.

## 2024-09-10 NOTE — ED Provider Notes (Signed)
 [facility] Provider Note    Event Date/Time   First MD Initiated Contact with Patient 09/10/24 1943     (approximate)  History   Chief Complaint: Foot Pain  HPI  Kendra Ritter is a 78 y.o. female with a past medical history of CHF, gastric reflux, hypertension, prior CVA, presents to the emergency department for lower extreme edema.  According to the patient for the last week or so she has noticed increased edema of both of her feet and now to the point where she is getting pain in her left foot when she attempts to ambulate.  Patient has a history of CHF and is prescribed Lasix but admits she has not been taking it recently.  She has not been using her compression stockings either.  Patient denies any chest pain or shortness of breath.  No erythema of the legs.  Patient denies fever although 100.1 currently.  Physical Exam   Triage Vital Signs: ED Triage Vitals [09/10/24 1804]  Encounter Vitals Group     BP (!) 188/103     Girls Systolic BP Percentile      Girls Diastolic BP Percentile      Boys Systolic BP Percentile      Boys Diastolic BP Percentile      Pulse Rate 97     Resp 18     Temp 100.1 F (37.8 C)     Temp Source Oral     SpO2 99 %     Weight 148 lb (67.1 kg)     Height 4' 11 (1.499 m)     Head Circumference      Peak Flow      Pain Score 9     Pain Loc      Pain Education      Exclude from Growth Chart     Most recent vital signs: Vitals:   09/10/24 1804  BP: (!) 188/103  Pulse: 97  Resp: 18  Temp: 100.1 F (37.8 C)  SpO2: 99%    General: Awake, no distress.  CV:  Good peripheral perfusion.  Regular rate and rhythm  Resp:  Normal effort.  Equal breath sounds bilaterally.  Abd:  No distention.  Soft, nontender.  Other:  Patient has 1+ pedal edema bilaterally with mild tenderness to palpation of the left foot.   ED Results / Procedures / Treatments   EKG  EKG viewed and interpreted by myself shows a normal sinus  rhythm 87 bpm with a narrow QRS, normal axis, normal intervals, nonspecific ST changes without ST elevation.  RADIOLOGY  I have reviewed interpret the chest x-ray images.  No consolidation on my evaluation, significant cardiomegaly Radiology is read the chest x-ray is negative.  Significant cardiomegaly unchanged.   MEDICATIONS ORDERED IN ED: Medications  HYDROcodone -acetaminophen  (NORCO/VICODIN) 5-325 MG per tablet 1 tablet (has no administration in time range)     IMPRESSION / MDM / ASSESSMENT AND PLAN / ED COURSE  I reviewed the triage vital signs and the nursing notes.  Patient's presentation is most consistent with acute presentation with potential threat to life or bodily function.  Patient presents emergency department for worsening swelling in her feet as well as some pain in the left foot.  Overall the patient appears well, no distress.  She does have 1+ pedal edema bilaterally with tenderness to palpation of the left foot.  Patient's lab work today does show a slight leukocytosis.  Patient's chemistry shows no significant finding.  Patient's troponin slightly elevated at 28 but not significantly elevated.  Symptoms have been ongoing for about a week.  Patient's BNP slightly elevated at 945.  Patient denies any shortness of breath or chest pain.  Denies any cough.  Borderline low-grade temperature 100.1 in the emergency department the patient denies any infectious symptoms no cough congestion urinary symptoms chest or abdominal pain.  Patient does have mild pain to palpation of the left foot but there is no surrounding erythema.  Discussed with the patient we should increase her Lasix from 20 mg daily to 40 mg for 5 days.  Patient does admit she has not been taking her Lasix recently.  She will need a new prescription for this medication.  Patient has also not been using her compression stockings and does not know if she has them.  Will prescribe new compression stockings for the patient.   Given the patient's borderline low-grade temperature with mild leukocytosis although denies any infectious symptoms we will cover with Keflex  for possibly mild developing cellulitis of the left foot although again no erythema present currently.  I sent a message to her cardiologist (Dr. Florencio) to see if we can get her in possibly this week or next for a repeat echocardiogram.  Discussed return precautions for any chest pain shortness of breath.  Patient agreeable to plan of care.  FINAL CLINICAL IMPRESSION(S) / ED DIAGNOSES   Peripheral edema    Note:  This document was prepared using Dragon voice recognition software and may include unintentional dictation errors.   Dorothyann Drivers, MD 09/10/24 2018

## 2024-09-10 NOTE — Discharge Instructions (Addendum)
 As we discussed the next 5 days please take 40 mg of Lasix/furosemide each morning, after which please continue to take your normal 20 mg daily.  Please wear your compression stockings during the day while awake you may take these off when sleeping or bathing.  Please take antibiotic as prescribed for the next 7 days.  Please call the number provided for cardiology to arrange a follow-up appointment preferably this week if you are able for reevaluation.  Return immediately to the emergency department for any chest pain or trouble breathing.

## 2024-09-10 NOTE — ED Triage Notes (Addendum)
 Pt presents to the ED via POV from home with bilateral foot pain and swelling x 2 weeks. Pt has a hx of CHF and takes a fluid pill. Pt denies SHOB or CP.

## 2024-10-03 ENCOUNTER — Other Ambulatory Visit: Payer: Self-pay | Admitting: Internal Medicine

## 2024-10-03 DIAGNOSIS — I5020 Unspecified systolic (congestive) heart failure: Secondary | ICD-10-CM

## 2024-10-03 DIAGNOSIS — I2510 Atherosclerotic heart disease of native coronary artery without angina pectoris: Secondary | ICD-10-CM

## 2024-10-17 ENCOUNTER — Other Ambulatory Visit: Payer: Self-pay | Admitting: Family Medicine

## 2024-10-17 DIAGNOSIS — Z1231 Encounter for screening mammogram for malignant neoplasm of breast: Secondary | ICD-10-CM

## 2024-10-31 ENCOUNTER — Telehealth (HOSPITAL_COMMUNITY): Payer: Self-pay | Admitting: *Deleted

## 2024-10-31 NOTE — Telephone Encounter (Signed)
 Attempted to call patient regarding upcoming cardiac CT appointment. Unable to leave VM. Sid Seats RN Navigator Cardiac Imaging [facility] [phone] Office

## 2024-11-01 ENCOUNTER — Ambulatory Visit: Admission: RE | Admit: 2024-11-01 | Source: Ambulatory Visit

## 2024-11-06 ENCOUNTER — Encounter

## 2024-11-20 ENCOUNTER — Encounter

## 2024-11-28 ENCOUNTER — Encounter: Admission: RE | Payer: Self-pay | Source: Home / Self Care

## 2024-11-28 ENCOUNTER — Ambulatory Visit: Admission: RE | Admit: 2024-11-28 | Source: Home / Self Care

## 2024-11-28 DIAGNOSIS — R9430 Abnormal result of cardiovascular function study, unspecified: Secondary | ICD-10-CM
